# Patient Record
Sex: Female | Born: 1935 | Race: White | Hispanic: No | Marital: Single | Smoking: Former smoker
Health system: Southern US, Community
[De-identification: ages and names within clinical notes are randomized; demographics above are authoritative.]

## PROBLEM LIST (undated history)

## (undated) DIAGNOSIS — N390 Urinary tract infection, site not specified: Secondary | ICD-10-CM

## (undated) DIAGNOSIS — I10 Essential (primary) hypertension: Secondary | ICD-10-CM

## (undated) DIAGNOSIS — E785 Hyperlipidemia, unspecified: Secondary | ICD-10-CM

## (undated) DIAGNOSIS — I739 Peripheral vascular disease, unspecified: Secondary | ICD-10-CM

## (undated) DIAGNOSIS — D72820 Lymphocytosis (symptomatic): Secondary | ICD-10-CM

## (undated) DIAGNOSIS — E871 Hypo-osmolality and hyponatremia: Secondary | ICD-10-CM

## (undated) DIAGNOSIS — J449 Chronic obstructive pulmonary disease, unspecified: Secondary | ICD-10-CM

## (undated) HISTORY — DX: Hypo-osmolality and hyponatremia: E87.1

## (undated) HISTORY — PX: TONSILECTOMY, ADENOIDECTOMY, BILATERAL MYRINGOTOMY AND TUBES: SHX2538

## (undated) HISTORY — PX: ABDOMINAL HYSTERECTOMY: SHX81

## (undated) HISTORY — DX: Lymphocytosis (symptomatic): D72.820

## (undated) HISTORY — DX: Hyperlipidemia, unspecified: E78.5

## (undated) HISTORY — DX: Peripheral vascular disease, unspecified: I73.9

## (undated) HISTORY — DX: Chronic obstructive pulmonary disease, unspecified: J44.9

## (undated) HISTORY — DX: Urinary tract infection, site not specified: N39.0

## (undated) HISTORY — DX: Essential (primary) hypertension: I10

---

## 2004-10-24 ENCOUNTER — Ambulatory Visit (HOSPITAL_COMMUNITY): Admission: RE | Admit: 2004-10-24 | Discharge: 2004-10-24 | Payer: Self-pay | Admitting: Cardiology

## 2009-09-17 ENCOUNTER — Encounter (HOSPITAL_COMMUNITY): Admission: RE | Admit: 2009-09-17 | Discharge: 2009-10-15 | Payer: Self-pay | Admitting: Cardiology

## 2011-10-26 ENCOUNTER — Encounter: Payer: Self-pay | Admitting: Rehabilitative and Restorative Service Providers"

## 2011-11-07 ENCOUNTER — Encounter: Payer: Self-pay | Admitting: Physical Therapy

## 2011-11-20 ENCOUNTER — Other Ambulatory Visit: Payer: Self-pay | Admitting: Cardiology

## 2012-01-25 ENCOUNTER — Emergency Department: Payer: Self-pay | Admitting: Emergency Medicine

## 2012-01-25 LAB — COMPREHENSIVE METABOLIC PANEL
Albumin: 4.1 g/dL (ref 3.4–5.0)
Alkaline Phosphatase: 61 U/L (ref 50–136)
BUN: 11 mg/dL (ref 7–18)
Calcium, Total: 9.6 mg/dL (ref 8.5–10.1)
Co2: 31 mmol/L (ref 21–32)
Creatinine: 0.75 mg/dL (ref 0.60–1.30)
EGFR (African American): >60
EGFR (Non-African Amer.): >60
Glucose: 99 mg/dL (ref 65–99)
Osmolality: 251 (ref 275–301)
SGOT(AST): 24 U/L (ref 15–37)
Total Protein: 7.1 g/dL (ref 6.4–8.2)

## 2012-01-25 LAB — URINALYSIS, COMPLETE
Bilirubin,UR: NEGATIVE
Glucose,UR: NEGATIVE mg/dL (ref 0–75)
Ketone: NEGATIVE
Protein: NEGATIVE
Squamous Epithelial: 5
WBC UR: 10 /HPF (ref 0–5)

## 2012-01-25 LAB — CBC
MCHC: 33.3 g/dL (ref 32.0–36.0)
Platelet: 222 10*3/uL (ref 150–440)
RBC: 4.13 10*6/uL (ref 3.80–5.20)

## 2012-02-14 ENCOUNTER — Other Ambulatory Visit: Payer: Self-pay | Admitting: Cardiology

## 2012-05-08 ENCOUNTER — Telehealth: Payer: Self-pay | Admitting: Oncology

## 2012-05-08 NOTE — Telephone Encounter (Signed)
Referred by Dr. Elvis [facility]

## 2012-05-08 NOTE — Telephone Encounter (Signed)
S/w the pt's caregiver susan and she is aware of the new pt appt with dr ha on 05/09/2012

## 2012-05-09 ENCOUNTER — Other Ambulatory Visit (HOSPITAL_BASED_OUTPATIENT_CLINIC_OR_DEPARTMENT_OTHER): Payer: Medicare Other | Admitting: Lab

## 2012-05-09 ENCOUNTER — Encounter: Payer: Self-pay | Admitting: Oncology

## 2012-05-09 ENCOUNTER — Ambulatory Visit (HOSPITAL_BASED_OUTPATIENT_CLINIC_OR_DEPARTMENT_OTHER): Payer: Medicare Other | Admitting: Oncology

## 2012-05-09 ENCOUNTER — Ambulatory Visit: Payer: Medicare Other

## 2012-05-09 VITALS — BP 152/73 | HR 74 | Temp 97.0°F | Ht 63.0 in | Wt 129.9 lb

## 2012-05-09 DIAGNOSIS — D72820 Lymphocytosis (symptomatic): Secondary | ICD-10-CM

## 2012-05-09 LAB — CBC WITH DIFFERENTIAL/PLATELET
BASO%: 0.2 % (ref 0.0–2.0)
Basophils Absolute: 0 10*3/uL (ref 0.0–0.1)
EOS%: 3.3 % (ref 0.0–7.0)
HGB: 13.5 g/dL (ref 11.6–15.9)
MCH: 32.5 pg (ref 25.1–34.0)
MCHC: 34.1 g/dL (ref 31.5–36.0)
MCV: 95.3 fL (ref 79.5–101.0)
MONO%: 8.1 % (ref 0.0–14.0)
RDW: 14 % (ref 11.2–14.5)

## 2012-05-09 LAB — COMPREHENSIVE METABOLIC PANEL
Albumin: 3.8 g/dL (ref 3.5–5.2)
Alkaline Phosphatase: 76 U/L (ref 39–117)
BUN: 11 mg/dL (ref 6–23)
Calcium: 9.5 mg/dL (ref 8.4–10.5)
Chloride: 93 mEq/L — ABNORMAL LOW (ref 96–112)
Creatinine, Ser: 0.8 mg/dL (ref 0.50–1.10)
Glucose, Bld: 92 mg/dL (ref 70–99)
Potassium: 3.7 mEq/L (ref 3.5–5.3)

## 2012-05-09 LAB — MORPHOLOGY

## 2012-05-09 NOTE — Progress Notes (Signed)
[facility]  Telephone:([phone]) [phone] Fax:([phone]) [phone]     INITIAL HEMATOLOGY CONSULTATION    Referral MD:  Madeline Jimenez, M.D.  Reason for Referral: lymphocytosis.     HPI: [name] is a 77 year old woman with HTN, HLP, recurrent UTI for many years.  She has had decreased appetite the last few years with slowly decreasing weight.  She was noted to have marked hyponatremia.  Work up with Nephrology was negative including SPEP/UPEP and SIADH.  She was noted to have lymphocyte-predominant leukocytosis.  Therefore, Madeline Jimenez kindly referred her to the Cancer [facility] for evaluation.   Madeline Jimenez presented to the clinic today with her niece and grandniece.  She reported that she has been having progressive bilateral calf pain over the past few years.  Pain is crampy/burning; worsened with ambulation, no swelling or discoloration of the skin in her legs.  She just has no appetite.  She denied fever, night sweat, palpable nodes.  Patient denies fever, anorexia, fatigue, headache, visual changes, confusion, drenching night sweats, palpable lymph node swelling, mucositis, odynophagia, dysphagia, nausea vomiting, jaundice, chest pain, palpitation, shortness of breath, dyspnea on exertion, productive cough, gum bleeding, epistaxis, hematemesis, hemoptysis, abdominal pain, abdominal swelling, early satiety, melena, hematochezia, hematuria, skin rash, spontaneous bleeding, joint swelling,heat or cold intolerance, bowel bladder incontinence, back pain, focal motor weakness, paresthesia, depression, suicidal or homocidal ideation, feeling hopelessness.    Past Medical History  Diagnosis Date  . COPD (chronic obstructive pulmonary disease)   . HTN (hypertension)   . Hyperlipidemia   . Peripheral vascular disease   . Lymphocytosis   . Hyponatremia   . Recurrent UTI   :    Past Surgical History  Procedure Date  . Abdominal hysterectomy     for fibroid  .  Tonsilectomy, adenoidectomy, bilateral myringotomy and tubes   :   CURRENT MEDS: Current Outpatient Prescriptions  Medication Sig Dispense Refill  . amLODipine (NORVASC) 10 MG tablet Take 10 mg by mouth daily.      Marland Kitchen aspirin 81 MG tablet Take 81 mg by mouth daily.      . cephALEXin (KEFLEX) 500 MG capsule Take 500 mg by mouth 2 (two) times daily. X 10 days      . traMADol (ULTRAM) 50 MG tablet Take 50 mg by mouth daily.          No Known Allergies:  Family History  Problem Relation Age of Onset  . Hypertension Father   . Heart attack Father   . Heart attack Sister   :  History   Social History  . Marital Status: Single    Spouse Name: N/A    Number of Children: 1  . Years of Education: N/A   Occupational History  .      retired Advertising copywriter   Social History Main Topics  . Smoking status: Current Everyday Smoker -- 2.0 packs/day for 50 years  . Smokeless tobacco: Not on file  . Alcohol Use: No  . Drug Use: No  . Sexually Active:    Other Topics Concern  . Not on file   Social History Narrative  . No narrative on file  :  REVIEW OF SYSTEM:  The rest of the 14-point review of sytem was negative.   Exam: ECOG 1  General:  Thin-appearing woman, in no acute distress.  Eyes:  no scleral icterus.  ENT:  There were no oropharyngeal lesions.  Neck was without thyromegaly.  Lymphatics:  Negative cervical, supraclavicular or axillary adenopathy.  Respiratory: lungs were clear bilaterally without wheezing or crackles.  Cardiovascular:  Regular rate and rhythm, S1/S2, without murmur, rub or gallop.  There was no pedal edema.  GI:  abdomen was soft, flat, nontender, nondistended, without organomegaly.  Muscoloskeletal:  no spinal tenderness of palpation of vertebral spine.  Skin exam was without echymosis, petichae.  Neuro exam was nonfocal.  Patient was able to get on and off exam table without assistance.  Gait was normal.  Patient was alerted and oriented.  Attention was  good.   Language was appropriate.  Mood was normal without depression.  Speech was not pressured.  Thought content was not tangential.    LABS:  Lab Results  Component Value Date   WBC 8.3 05/09/2012   HGB 13.5 05/09/2012   HCT 39.5 05/09/2012   PLT 193 05/09/2012   GLUCOSE 92 05/09/2012   ALT 12 05/09/2012   AST 13 05/09/2012   NA 133* 05/09/2012   K 3.7 05/09/2012   CL 93* 05/09/2012   CREATININE 0.80 05/09/2012   BUN 11 05/09/2012   CO2 30 05/09/2012    Blood smear review:   I personally reviewed the patient's peripheral blood smear today.  There was isocytosis.  There was no peripheral blast.  There was no schistocytosis, spherocytosis, target cell, rouleaux formation, tear drop cell.  There was no giant platelets or platelet clumps.      ASSESSMENT AND PLAN:   1.  Recurrent UTI:  Her niece thinks that patient is not cleaning herself the correct way when she has a bowel movement.  However, if it is a recurrent problem, she may benefit from Urology referral.   2.  Poor PO intake:  No clear etiology.    3.  Smoking:  I strongly urged her to stop smoking given her PVD and family members with CAD.  I referred her to smoking cessation class.   4. Hyponatremia:  Ruled out for myeloma.  Most likely poor PO intake.  Her Na is improved today.   5.  Bilateral calf pain:  Most likely due to arterial insufficiency.  I strongly urged her to stop smoking before a referral to vascular surgery for evaluation for bypass.   6.  Lymphocytosis:  Most likely due to recurrent UTI.  Today, her WBC and lymphocytes were normal.  I have low clinical suspicion for lymphoproliferative diease due to lack of constitutional symptoms (except for anorexia which can be nonspecific and age related).  There is no indication for further work up.   Patient was discharged from clinic today.  Madeline Jimenez and relatives were relieved that her lymphocytosis has resolved and agreed with the plan for watchful observation.    RECOMMEND to PCP:   - CBC twice yearly to ensure no recurrent lymphocytosis.  - If recurrent and persistent lymphocytosis, patient can be referred back to [place] again.      Thank you for this referral.    The length of time of the face-to-face encounter was 30 minutes. More than 50% of time was spent counseling and coordination of care.

## 2012-05-10 ENCOUNTER — Telehealth: Payer: Self-pay | Admitting: *Deleted

## 2012-05-10 NOTE — Telephone Encounter (Signed)
Patient referred by Dr. Gaylyn Rong to smoking cessation classes. Madeline Jimenez Smoking Cessation class facilitator called patient 05/10/12 to let patient know about our smoking cessation program at the [facility]. Patient stated she is not interested at this time for has to much going on right now.

## 2012-08-07 ENCOUNTER — Other Ambulatory Visit: Payer: Self-pay | Admitting: Internal Medicine

## 2012-08-07 DIAGNOSIS — Z1231 Encounter for screening mammogram for malignant neoplasm of breast: Secondary | ICD-10-CM

## 2012-09-10 ENCOUNTER — Ambulatory Visit
Admission: RE | Admit: 2012-09-10 | Discharge: 2012-09-10 | Disposition: A | Discharge status: Home / Self Care | Payer: Medicare Other | Source: Ambulatory Visit | Attending: Internal Medicine | Admitting: Internal Medicine

## 2012-09-10 DIAGNOSIS — Z1231 Encounter for screening mammogram for malignant neoplasm of breast: Secondary | ICD-10-CM

## 2013-07-12 IMAGING — CR PELVIS - 1-2 VIEW
1 series · 2 of 2 positions shown · non-contrast
Comparison: none

REASON FOR EXAM: bilateral leg weakness/pain - falling
COMMENTS:

PROCEDURE:     DXR - DXR PELVIS AP ONLY  - January 25, 2012  [DATE]
RESULT:     There is no evidence of fracture, dislocation, or malalignment.

[Series 1: t pelvis ap · 0.14mm/px · 2 of 2 slices shown]
[im 1/2]
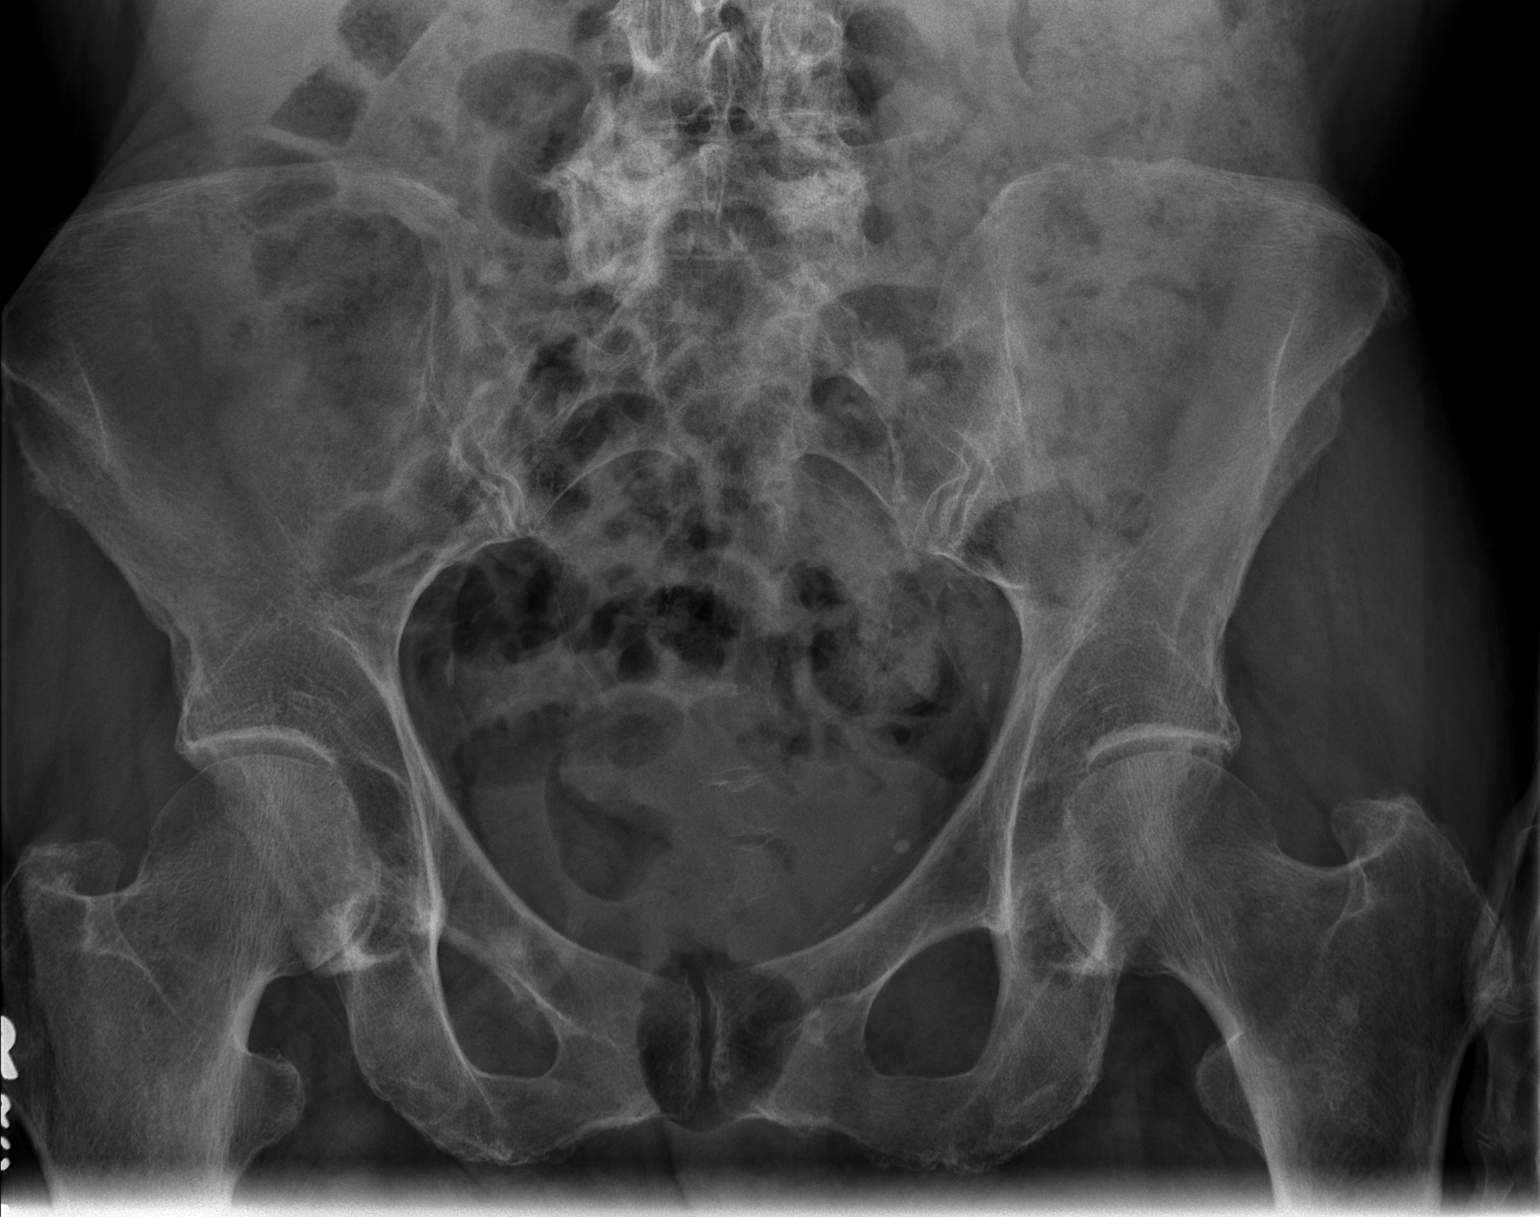
[im 2/2]
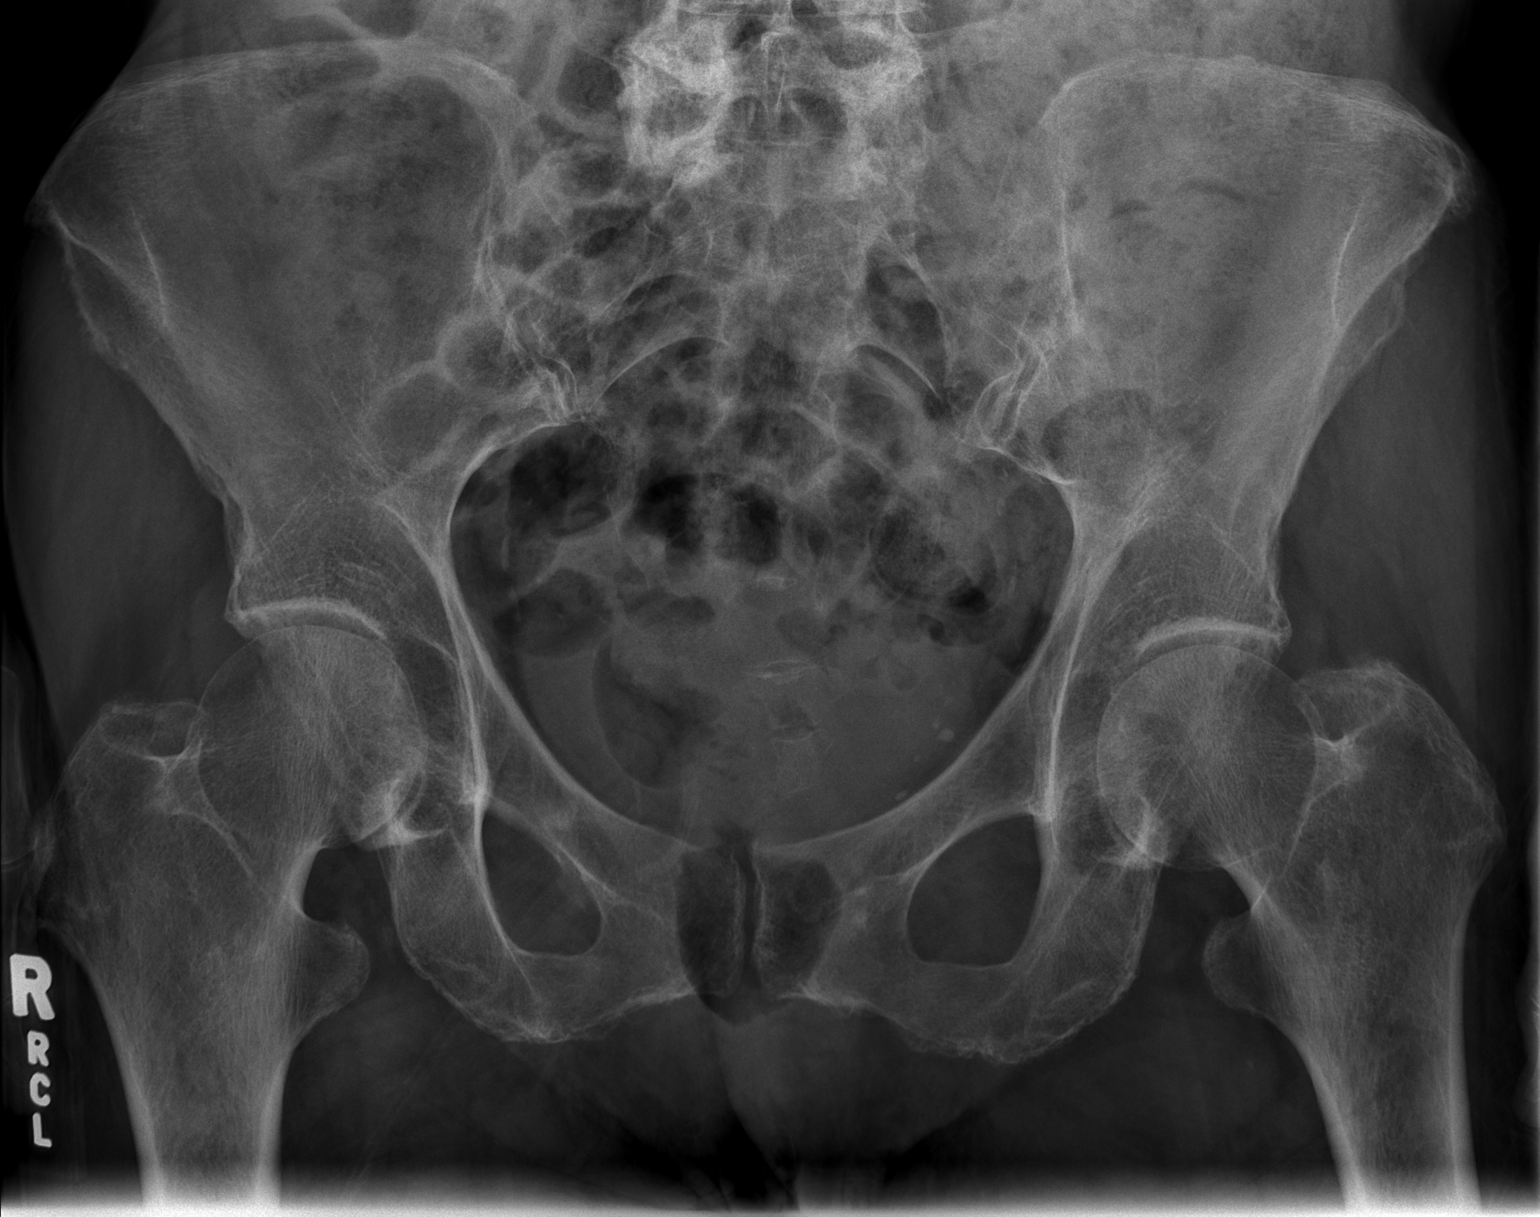

[2 of 2 positions shown; findings below may reference images not displayed]

IMPRESSION: 1. No evidence of acute abnormalities.
2. If there are persistent complaints of pain or persistent clinical
concern, a repeat evaluation in 7-10 days is recommended if clinically
warranted.

## 2014-05-12 ENCOUNTER — Other Ambulatory Visit (HOSPITAL_COMMUNITY): Payer: Self-pay | Admitting: Nurse Practitioner

## 2014-05-12 ENCOUNTER — Encounter: Payer: Self-pay | Admitting: Internal Medicine

## 2014-05-12 DIAGNOSIS — R52 Pain, unspecified: Secondary | ICD-10-CM

## 2014-05-14 ENCOUNTER — Emergency Department (HOSPITAL_COMMUNITY): Payer: Medicare Other

## 2014-05-14 ENCOUNTER — Observation Stay (HOSPITAL_COMMUNITY)
Admission: EM | Admit: 2014-05-14 | Discharge: 2014-05-15 | Disposition: A | Discharge status: Home / Self Care | Payer: Medicare Other | Attending: Internal Medicine | Admitting: Internal Medicine

## 2014-05-14 ENCOUNTER — Encounter (HOSPITAL_COMMUNITY): Payer: Self-pay | Admitting: Emergency Medicine

## 2014-05-14 DIAGNOSIS — I517 Cardiomegaly: Secondary | ICD-10-CM | POA: Diagnosis not present

## 2014-05-14 DIAGNOSIS — D72829 Elevated white blood cell count, unspecified: Secondary | ICD-10-CM | POA: Insufficient documentation

## 2014-05-14 DIAGNOSIS — X30XXXA Exposure to excessive natural heat, initial encounter: Secondary | ICD-10-CM | POA: Diagnosis not present

## 2014-05-14 DIAGNOSIS — Z7982 Long term (current) use of aspirin: Secondary | ICD-10-CM | POA: Diagnosis not present

## 2014-05-14 DIAGNOSIS — J449 Chronic obstructive pulmonary disease, unspecified: Secondary | ICD-10-CM

## 2014-05-14 DIAGNOSIS — I10 Essential (primary) hypertension: Secondary | ICD-10-CM

## 2014-05-14 DIAGNOSIS — B372 Candidiasis of skin and nail: Secondary | ICD-10-CM | POA: Diagnosis not present

## 2014-05-14 DIAGNOSIS — E785 Hyperlipidemia, unspecified: Secondary | ICD-10-CM | POA: Insufficient documentation

## 2014-05-14 DIAGNOSIS — T675XXA Heat exhaustion, unspecified, initial encounter: Secondary | ICD-10-CM | POA: Diagnosis present

## 2014-05-14 DIAGNOSIS — F0390 Unspecified dementia without behavioral disturbance: Secondary | ICD-10-CM | POA: Diagnosis not present

## 2014-05-14 DIAGNOSIS — F17200 Nicotine dependence, unspecified, uncomplicated: Secondary | ICD-10-CM | POA: Diagnosis not present

## 2014-05-14 DIAGNOSIS — E871 Hypo-osmolality and hyponatremia: Secondary | ICD-10-CM | POA: Diagnosis not present

## 2014-05-14 DIAGNOSIS — J9819 Other pulmonary collapse: Secondary | ICD-10-CM | POA: Insufficient documentation

## 2014-05-14 DIAGNOSIS — J4489 Other specified chronic obstructive pulmonary disease: Secondary | ICD-10-CM | POA: Insufficient documentation

## 2014-05-14 DIAGNOSIS — I739 Peripheral vascular disease, unspecified: Secondary | ICD-10-CM | POA: Insufficient documentation

## 2014-05-14 LAB — COMPREHENSIVE METABOLIC PANEL
ALT: 27 U/L (ref 0–35)
ANION GAP: 12 (ref 5–15)
AST: 50 U/L — ABNORMAL HIGH (ref 0–37)
Albumin: 3.9 g/dL (ref 3.5–5.2)
Alkaline Phosphatase: 78 U/L (ref 39–117)
BUN: 7 mg/dL (ref 6–23)
CALCIUM: 9.2 mg/dL (ref 8.4–10.5)
CO2: 28 meq/L (ref 19–32)
Chloride: 84 mEq/L — ABNORMAL LOW (ref 96–112)
Creatinine, Ser: 0.82 mg/dL (ref 0.50–1.10)
GFR, EST AFRICAN AMERICAN: 78 mL/min — AB (ref >90)
GFR, EST NON AFRICAN AMERICAN: 67 mL/min — AB (ref >90)
GLUCOSE: 93 mg/dL (ref 70–99)
Potassium: 4.2 mEq/L (ref 3.7–5.3)
Sodium: 124 mEq/L — ABNORMAL LOW (ref 137–147)
TOTAL PROTEIN: 6.4 g/dL (ref 6.0–8.3)
Total Bilirubin: 0.8 mg/dL (ref 0.3–1.2)

## 2014-05-14 LAB — CBC WITH DIFFERENTIAL/PLATELET
BASOS ABS: 0 10*3/uL (ref 0.0–0.1)
BASOS PCT: 0 % (ref 0–1)
EOS ABS: 0 10*3/uL (ref 0.0–0.7)
Eosinophils Relative: 0 % (ref 0–5)
HEMATOCRIT: 33.3 % — AB (ref 36.0–46.0)
HEMOGLOBIN: 11.4 g/dL — AB (ref 12.0–15.0)
LYMPHS ABS: 10.9 10*3/uL — AB (ref 0.7–4.0)
LYMPHS PCT: 54 % — AB (ref 12–46)
MCH: 31.5 pg (ref 26.0–34.0)
MCHC: 34.2 g/dL (ref 30.0–36.0)
MCV: 92 fL (ref 78.0–100.0)
MONO ABS: 0.6 10*3/uL (ref 0.1–1.0)
Monocytes Relative: 3 % (ref 3–12)
NEUTROS ABS: 8.6 10*3/uL — AB (ref 1.7–7.7)
Neutrophils Relative %: 43 % (ref 43–77)
Platelets: 207 10*3/uL (ref 150–400)
RBC: 3.62 MIL/uL — ABNORMAL LOW (ref 3.87–5.11)
RDW: 12.9 % (ref 11.5–15.5)
WBC: 20.1 10*3/uL — ABNORMAL HIGH (ref 4.0–10.5)

## 2014-05-14 LAB — URINALYSIS, ROUTINE W REFLEX MICROSCOPIC
BILIRUBIN URINE: NEGATIVE
Glucose, UA: NEGATIVE mg/dL
HGB URINE DIPSTICK: NEGATIVE
Ketones, ur: NEGATIVE mg/dL
Leukocytes, UA: NEGATIVE
NITRITE: NEGATIVE
PROTEIN: NEGATIVE mg/dL
SPECIFIC GRAVITY, URINE: 1.008 (ref 1.005–1.030)
UROBILINOGEN UA: 0.2 mg/dL (ref 0.0–1.0)
pH: 7 (ref 5.0–8.0)

## 2014-05-14 LAB — I-STAT CG4 LACTIC ACID, ED: LACTIC ACID, VENOUS: 0.85 mmol/L (ref 0.5–2.2)

## 2014-05-14 LAB — TSH: TSH: 1.08 u[IU]/mL (ref 0.350–4.500)

## 2014-05-14 LAB — CK: Total CK: 691 U/L — ABNORMAL HIGH (ref 7–177)

## 2014-05-14 MED ORDER — SODIUM CHLORIDE 0.9 % IV BOLUS (SEPSIS)
500.0000 mL | Freq: Once | INTRAVENOUS | Status: AC
  Administered 2014-05-14: 500 mL via INTRAVENOUS

## 2014-05-14 MED ORDER — TRIAMCINOLONE ACETONIDE 0.025 % EX CREA
TOPICAL_CREAM | Freq: Two times a day (BID) | CUTANEOUS | Status: DC
  Administered 2014-05-15: 08:00:00 via TOPICAL
  Filled 2014-05-14: qty 15

## 2014-05-14 MED ORDER — NYSTATIN 100000 UNIT/GM EX POWD
Freq: Two times a day (BID) | CUTANEOUS | Status: DC
  Administered 2014-05-15: 08:00:00 via TOPICAL
  Filled 2014-05-14: qty 15

## 2014-05-14 MED ORDER — LABETALOL HCL 200 MG PO TABS
200.0000 mg | ORAL_TABLET | Freq: Two times a day (BID) | ORAL | Status: DC
  Administered 2014-05-15: 200 mg via ORAL
  Filled 2014-05-14 (×3): qty 1

## 2014-05-14 MED ORDER — LABETALOL HCL 5 MG/ML IV SOLN: 5.0000 mg | Freq: Once | INTRAVENOUS | Status: DC

## 2014-05-14 MED ORDER — ENOXAPARIN SODIUM 40 MG/0.4ML ~~LOC~~ SOLN
40.0000 mg | SUBCUTANEOUS | Status: DC
  Administered 2014-05-15: 40 mg via SUBCUTANEOUS
  Filled 2014-05-14: qty 0.4

## 2014-05-14 MED ORDER — SODIUM CHLORIDE 0.9 % IV SOLN
INTRAVENOUS | Status: DC
  Administered 2014-05-14: via INTRAVENOUS

## 2014-05-14 MED ORDER — LABETALOL HCL 5 MG/ML IV SOLN
10.0000 mg | Freq: Once | INTRAVENOUS | Status: AC
  Administered 2014-05-14: 10 mg via INTRAVENOUS
  Filled 2014-05-14: qty 4

## 2014-05-14 MED ORDER — ALBUTEROL SULFATE (2.5 MG/3ML) 0.083% IN NEBU: 3.0000 mL | INHALATION_SOLUTION | Freq: Four times a day (QID) | RESPIRATORY_TRACT | Status: DC | PRN

## 2014-05-14 MED ORDER — ACETAMINOPHEN 650 MG RE SUPP: 650.0000 mg | Freq: Four times a day (QID) | RECTAL | Status: DC | PRN

## 2014-05-14 MED ORDER — SODIUM CHLORIDE 0.9 % IJ SOLN
3.0000 mL | Freq: Two times a day (BID) | INTRAMUSCULAR | Status: DC
  Administered 2014-05-15: 3 mL via INTRAVENOUS

## 2014-05-14 MED ORDER — ACETAMINOPHEN 325 MG PO TABS: 650.0000 mg | ORAL_TABLET | Freq: Four times a day (QID) | ORAL | Status: DC | PRN

## 2014-05-14 MED ORDER — ASPIRIN 81 MG PO CHEW
81.0000 mg | CHEWABLE_TABLET | Freq: Every day | ORAL | Status: DC
  Administered 2014-05-15: 81 mg via ORAL
  Filled 2014-05-14: qty 1

## 2014-05-14 MED ORDER — SODIUM CHLORIDE 0.9 % IV SOLN
Freq: Once | INTRAVENOUS | Status: AC
  Administered 2014-05-14: 18:00:00 via INTRAVENOUS

## 2014-05-14 MED ORDER — LABETALOL HCL 5 MG/ML IV SOLN: 10.0000 mg | Freq: Once | INTRAVENOUS | Status: DC

## 2014-05-14 MED ORDER — DONEPEZIL HCL 5 MG PO TABS
5.0000 mg | ORAL_TABLET | Freq: Every day | ORAL | Status: DC
  Administered 2014-05-15: 5 mg via ORAL
  Filled 2014-05-14: qty 1

## 2014-05-14 NOTE — H&P (Signed)
Date: 05/14/2014               Patient Name:  Madeline Jimenez MRN: [id number]  DOB: 03/05/1935 Age / Sex: 79 y.o., female   PCP: Suzan Garibaldi, FNP         Medical Service: Internal Medicine Teaching Service         Attending Physician: Dr. Madilyn Fireman, MD    First Contact: Dr. Arcelia Jew Pager: [phone]  Second Contact: Dr. Algis Liming Pager: [phone]       After Hours (After 5p/  First Contact Pager: [phone]  weekends / holidays): Second Contact Pager: [phone]   Chief Complaint: weakness  History of Present Illness: Madeline Jimenez is a 79 y.o. Female with PMHx of COPD, HTN, HLD, and PVD who presented to the ED with complaint of weakness. Pt states she was sitting outside today in the sun for one to two hours, then proceeded to try to stand up and couldn't get out of her chair. She states her legs were too weak. Her niece's husband saw her attempts and came outside to help her. At that point, the husband said the patient passed out, but the patient does not remember losing consciousness. No urinary or bowel incontinence. EMS was called and patient was brought to the ED. Upon arrival to the ED, pt was diaphoretic, "hot." Cold packs were placed in the patients axillas and groin. Pt states she hasn't drank many fluids today and that she hadn't taken her medications. She denied any headache, chest pain, shortness of breath, nausea, dizziness, change in vision, hearing or speech, or confusion. Pt states she feels much better now and denies any complaints.   Meds: No current facility-administered medications for this encounter.   Current Outpatient Prescriptions  Medication Sig Dispense Refill  . aspirin 81 MG tablet Take 81 mg by mouth daily.      Marland Kitchen donepezil (ARICEPT) 5 MG tablet Take 5 mg by mouth daily.      Marland Kitchen labetalol (NORMODYNE) 200 MG tablet Take 200 mg by mouth 2 (two) times daily.      Marland Kitchen losartan-hydrochlorothiazide (HYZAAR) 100-12.5 MG per tablet Take 1 tablet by mouth daily.      .  Methylfol-Methylcob-Acetylcyst (CEREFOLIN NAC PO) Take 1 tablet by mouth daily.      Marland Kitchen PROAIR HFA 108 (90 BASE) MCG/ACT inhaler Inhale 1 puff into the lungs every 6 (six) hours as needed.      . triamcinolone cream (KENALOG) 0.1 % Apply 1 application topically 2 (two) times daily.        Allergies: Allergies as of 05/14/2014  . (No Known Allergies)   Past Medical History  Diagnosis Date  . COPD (chronic obstructive pulmonary disease)   . HTN (hypertension)   . Hyperlipidemia   . Peripheral vascular disease   . Lymphocytosis   . Hyponatremia   . Recurrent UTI    Past Surgical History  Procedure Laterality Date  . Abdominal hysterectomy      for fibroid  . Tonsilectomy, adenoidectomy, bilateral myringotomy and tubes     Family History  Problem Relation Age of Onset  . Hypertension Father   . Heart attack Father   . Heart attack Sister    History   Social History  . Marital Status: Widowed    Spouse Name: N/A    Number of Children: 1  . Years of Education: N/A   Occupational History  .      retired Secretary/administrator   Social History  Main Topics  . Smoking status: Current Every Day Smoker -- 2.00 packs/day for 50 years  . Smokeless tobacco: Not on file  . Alcohol Use: No  . Drug Use: No  . Sexual Activity:    Other Topics Concern  . Not on file   Social History Narrative  . No narrative on file    Review of Systems: General: Admits to diaphoresis. Denies fever, chills, appetite change and fatigue.  Respiratory: Denies SOB, DOE, cough, chest tightness, and wheezing.   Cardiovascular: Denies chest pain and palpitations.  Gastrointestinal: Denies nausea, vomiting, abdominal pain, diarrhea, constipation, blood in stool and abdominal distention.  Genitourinary: Denies dysuria, urgency, frequency, hematuria, and flank pain. Endocrine: Denies hot or cold intolerance, polyuria, and polydipsia. Musculoskeletal: Denies myalgias, back pain, joint swelling, arthralgias and  gait problem.  Skin: Admits to rash on her buttock. Denies pallor, and wounds.  Neurological: Admits to weakness. Denies dizziness, seizures, syncope, numbness and headaches.  Psychiatric/Behavioral: Denies confusion, mood changes, nervousness, sleep disturbance and agitation.  Physical Exam: Filed Vitals:   05/14/14 2115 05/14/14 2159 05/14/14 2209 05/14/14 2225  BP: 184/71 190/74 193/149 177/96  Pulse: 64  65   Temp:      TempSrc:      Resp:   14   Height:      Weight:      SpO2: 98%  99%    General: Vital signs reviewed.  Patient is a well-developed and well-nourished, in no acute distress and cooperative with exam.  Head: Normocephalic and atraumatic. Eyes:  EOMI, conjunctivae normal, No scleral icterus.  Neck: Supple, trachea midline, normal ROM, No JVD, masses, thyromegaly, or carotid bruit present.  Cardiovascular: RRR, S1 normal, S2 normal, no murmurs, gallops, or rubs. Pulmonary/Chest: Clear to auscultation bilaterally, no wheezes, rales, or rhonchi. Abdominal: Soft, non-tender, non-distended, no suprapubic tenderness, BS +, no masses, organomegaly, or guarding present.  Musculoskeletal: No joint deformities, erythema, or stiffness, ROM full and nontender. Extremities: No lower extremity edema bilaterally,  pulses symmetric and intact bilaterally. No cyanosis or clubbing. Neurological: A&O x3, Strength is normal and symmetric bilaterally, 5/5 in all extremities, cranial nerve II-XII are grossly intact, no focal motor deficit, sensory intact to light touch bilaterally, EOMI.   Skin: Erythematous, moist skin around her sacral area and between gluteal folds. Punctate lesions with blood. Skin is mildly denuded.  Psychiatric: Normal mood and affect. speech and behavior is normal. Cognition and memory are normal.   Lab results: Basic Metabolic Panel:  Recent Labs  05/14/14 1628  NA 124*  K 4.2  CL 84*  CO2 28  GLUCOSE 93  BUN 7  CREATININE 0.82  CALCIUM 9.2   Liver  Function Tests:  Recent Labs  05/14/14 1628  AST 50*  ALT 27  ALKPHOS 78  BILITOT 0.8  PROT 6.4  ALBUMIN 3.9   CBC:  Recent Labs  05/14/14 1628  WBC 20.1*  NEUTROABS 8.6*  HGB 11.4*  HCT 33.3*  MCV 92.0  PLT 207     Recent Labs  05/14/14 1628  TSH 1.080   Urinalysis:  Recent Labs  05/14/14 1828  COLORURINE YELLOW  LABSPEC 1.008  PHURINE 7.0  GLUCOSEU NEGATIVE  HGBUR NEGATIVE  BILIRUBINUR NEGATIVE  KETONESUR NEGATIVE  PROTEINUR NEGATIVE  UROBILINOGEN 0.2  NITRITE NEGATIVE  LEUKOCYTESUR NEGATIVE   Imaging results:  Dg Chest 2 View  05/14/2014   CLINICAL DATA:  Altered mental status.  EXAM: CHEST  2 VIEW  COMPARISON:  None.  FINDINGS: Mediastinum  and hilar structures are normal. Cardiomegaly with normal pulmonary vascularity. Mild basilar atelectasis. Multiple left rib fractures are noted. Degenerative changes thoracic spine.  IMPRESSION: 1. Mild basilar atelectasis. 2. Cardiomegaly, normal pulmonary vascularity. 3. Old left rib fractures.   Electronically Signed   By: [place]   On: 05/14/2014 17:31   Ct Head Wo Contrast  05/14/2014   CLINICAL DATA:  Headache. Vertigo. High blood pressure. Hyperlipidemia.  EXAM: CT HEAD WITHOUT CONTRAST  TECHNIQUE: Contiguous axial images were obtained from the base of the skull through the vertex without intravenous contrast.  COMPARISON:  None.  FINDINGS: No intracranial hemorrhage.  Small vessel disease type changes.  Question posterior right frontal lobe infarct?  No intracranial mass lesion noted on this unenhanced exam.  No hydrocephalus.  Visualized mastoid air cells, middle ear cavities and visualized paranasal sinus are clear.  Mild exophthalmos.  Calcification internal carotid artery cavernous segment.  IMPRESSION: No intracranial hemorrhage.  Small vessel disease type changes.  Question posterior right frontal lobe infarct?   Electronically Signed   By: Chauncey Cruel M.D.   On: 05/14/2014 17:17    Assessment &  Plan by Problem:  Heat Exhaustion: Pt presents to ED after feeling weak and having a possible syncopal episode this afternoon after sitting in the sun for around 2 hours. She admits to poor hydration. Vital signs on admission : T 100.6, hypertensive at 170/64, HR 76, RR 18, pulse ox 97% on 2 L. Labs show leukocytosis of 20.1 and hyponatremia of 124. CK 691. Physical exam is unremarkable. No neurologic deficits, strength 5/5 in all extremities. No confusion. Weakness most like due to heat exhaustion and dehydration. Pt feels much better after 500 mL NS bolus in the ED. Symptoms are not likely due to CVA. Pt has no history of stroke or seizure. CT head without contrast shows no intracranial hemorrhage, shows small vessel disease type changes, and questionable posterior right frontal lobe infarct. This finding was discussed in person with radiology who thought it looked chronic in nature and noticed other white matter disease. Upon correlation with physical exam findings, CVA is unlikely. Symptoms are not likely due to infectious etiology. Pt has no complaints of cough, shortness of breath, fever, nausea, vomiting, diarrhea, abdominal or suprapubic pain. However, pt does have a leukocytosis of 20.1 and fever of 100.6 on admission. Lactic acid is normal at 0.85. CXR shows mild basilar atelectasis, cardiomegaly, normal pulmonary vascularity, and old left rib fractures. Urinalysis is negative for leukocytes, nitrites and ketones. -NS 125 mL/hr -Blood cultures x 2 -Urine culture -Bed rest -CBC/BMP in am -I/Os -Neuro checks -Orthostatic vital signs -Cardiac Diet  Lymphocyte-predominant Leukocytosis: Pt has a WBC of 20.1 on admission with predominant lymphocytes 54%. Pt has a history of lymphocyte predominant leukocytosis. She was previously seen by Dr. Lamonte Sakai in July 2013 who thought her leukocytosis was 2/2 recurrent UTIs. Dr. Lamonte Sakai stated if leukocytosis was to persist or progress, to consider referral back to  outpatient hematology/oncology. Pt originally febrile on admission, 100.6, now down at 98.7. No complaints of fever, denies dysuria or urinary frequency. Denies cough or shortness of breath. CXR and UA both normal (see above). No obvious signs of infection. Leukocytosis most likely due to heat exhaustion; however, if leukocytosis persists, consider further evaluation and/or outpatient heme/onc referral. -Blood cultures x 2 -Urine culture   Candidal Intertrigo: Pt complains of rash between gluteal folds and over sacrum that has persisted for several weeks. She uses triamcinolone cream at home. On  physical exam, patient has erythematous, moist skin around her sacral area and between gluteal folds. Punctate lesions with blood. Skin is mildly denuded. Most likely 2/2 fungal infection. No sign sacral ulcer. -Triamcinolone 0.025% cream BID -Nystatin topical powder BID -Consult wound care  Hyponatremia: Pt presents with Na of 124 on admission. History of hyponatremia, chronic in nature. Likely 2/2 hypovolemic hypotonic hyponatremia worsened in the setting of dehydration. Previous notes suggest, chronicity is due to poor po intake. Pt has been ruled out for multiple myeloma in the past. BUN 7, Cr 0.82, no lower extremity edema. Normal neurologic examination. -NS 125 mL/hr -Orthostatic vital signs -Hold HCTZ: thiazides can increase risk for neuro complications in hyponatremic patients  COPD: Pt has history of COPD. On nighttime oxygen via [place] at home. No complaints of shortness of breath, cough or DOE. CXR shows mild basilar atelectasis, cardiomegaly, normal pulmonary vascularity, and old left rib fractures. Pt uses proair at home. -Proventil albuterol inhaler 1 puff Q6H prn wheezing  -Oxygen via [place] prn   HTN: BP on admission 170/64. Pt has not taken her blood pressure medications today. Normally well controlled on BP meds at home. Given labetalol 10 mg IV once in the ED. No complaints of headache. On  labetalol, losartan-HCTZ at home -Labetalol 200 mg BID -Hold HCTZ in setting of hyponatremia  H/o Dementia: Niece states patient has been to her primary care physician/nurse practitioner several times in the last few weeks. They're concerned that her "memory is getting difficult". AAOx3. No confusion. Pt on donepezil 5 mg daily at home. -Continue donepezil 5 mg daily  DVT/PE ppx: Lovenox 40 mg SQ daily  Dispo: Disposition is deferred at this time, awaiting improvement of current medical problems. Anticipated discharge in approximately 1-2 day(s).   The patient does have a current PCP Suzan Garibaldi, [place]) and does not need an [facility] hospital follow-up appointment after discharge.  The patient does not have transportation limitations that hinder transportation to clinic appointments.  Signed: Osa Craver, DO PGY-1 Internal Medicine Resident Pager # [phone] 05/14/2014 10:59 PM

## 2014-05-14 NOTE — ED Notes (Signed)
Patient lab results reported to Dr.James.

## 2014-05-14 NOTE — ED Notes (Signed)
Pt unable to void on bedpan

## 2014-05-14 NOTE — ED Provider Notes (Signed)
CSN: [id number]     Arrival date & time 05/14/14  1511 History   First MD Initiated Contact with Patient 05/14/14 1547     Chief Complaint  Patient presents with  . Heat Exposure  . Altered Mental Status      HPI  Patient presents via EMS. She was sitting in a chair outside of her home on a patio,  for over an hour. Daughter states that she left to go to the store. Her mother wassitting in a chair on the back. In the sun, not in the shade. She was gone a little over an hour. She pulled into the  Driveway and saw her husband was "waving  wildly". He states that he saw his mother-in-law trying to stand and walk and she seemed wobbly and weak. He helped her to lay  down onto the deck. She did not fall. She did not injure her head. She was not unconscious.  Daughter states she has been to her primary care physician/nurse practitioner several times in the last few weeks. They're concerned that her "memory is getting difficult". Her vision has changed and scheduled for an eye exam. She noticed some bleeding from an external hemorrhoid. No frank BRBPR or melena.  Past Medical History  Diagnosis Date  . COPD (chronic obstructive pulmonary disease)   . HTN (hypertension)   . Hyperlipidemia   . Peripheral vascular disease   . Lymphocytosis   . Hyponatremia   . Recurrent UTI    Past Surgical History  Procedure Laterality Date  . Abdominal hysterectomy      for fibroid  . Tonsilectomy, adenoidectomy, bilateral myringotomy and tubes     Family History  Problem Relation Age of Onset  . Hypertension Father   . Heart attack Father   . Heart attack Sister    History  Substance Use Topics  . Smoking status: Current Every Day Smoker -- 2.00 packs/day for 50 years  . Smokeless tobacco: Not on file  . Alcohol Use: No   OB History   Grav Para Term Preterm Abortions TAB SAB Ect Mult Living                 Review of Systems  Constitutional: Positive for diaphoresis and fatigue. Negative for  fever, chills and appetite change.  HENT: Negative for mouth sores, sore throat and trouble swallowing.   Eyes: Negative for visual disturbance.  Respiratory: Negative for cough, chest tightness, shortness of breath and wheezing.   Cardiovascular: Negative for chest pain.  Gastrointestinal: Negative for nausea, vomiting, abdominal pain, diarrhea and abdominal distention.  Endocrine: Negative for polydipsia, polyphagia and polyuria.  Genitourinary: Negative for dysuria, frequency and hematuria.  Musculoskeletal: Negative for gait problem.  Skin: Negative for color change, pallor and rash.  Neurological: Positive for weakness and light-headedness. Negative for dizziness, syncope and headaches.  Hematological: Does not bruise/bleed easily.  Psychiatric/Behavioral: Negative for behavioral problems and confusion.      Allergies  Review of patient's allergies indicates no known allergies.  Home Medications   Prior to Admission medications   Medication Sig Start Date End Date Taking? Authorizing Provider  aspirin 81 MG tablet Take 81 mg by mouth daily.   Yes Historical Provider, MD  donepezil (ARICEPT) 5 MG tablet Take 5 mg by mouth daily. 05/06/14  Yes Historical Provider, MD  labetalol (NORMODYNE) 200 MG tablet Take 200 mg by mouth 2 (two) times daily. 04/29/14  Yes Historical Provider, MD  losartan-hydrochlorothiazide (HYZAAR) 100-12.5 MG per tablet  Take 1 tablet by mouth daily. 04/22/14  Yes Historical Provider, MD  Methylfol-Methylcob-Acetylcyst (CEREFOLIN NAC PO) Take 1 tablet by mouth daily.   Yes Historical Provider, MD  PROAIR HFA 108 (90 BASE) MCG/ACT inhaler Inhale 1 puff into the lungs every 6 (six) hours as needed. 04/29/14  Yes Historical Provider, MD  triamcinolone cream (KENALOG) 0.1 % Apply 1 application topically 2 (two) times daily. 05/13/14  Yes Historical Provider, MD   BP 179/55  Pulse 65  Temp(Src) 97.9 F (36.6 C) (Oral)  Resp 16  Ht 5' 4.5" (1.638 m)  Wt 157 lb 10.1  oz (71.5 kg)  BMI 26.65 kg/m2  SpO2 100% Physical Exam  Constitutional: She is oriented to person, place, and time. She appears well-developed and well-nourished. No distress.  Heart appearing. Alert. Oriented.  HENT:  Head: Normocephalic.  Very hard of hearing  Eyes: Conjunctivae are normal. Pupils are equal, round, and reactive to light. No scleral icterus.  Neck: Normal range of motion. Neck supple. No thyromegaly present.  Cardiovascular: Normal rate and regular rhythm.  Exam reveals no gallop and no friction rub.   No murmur heard. Pulmonary/Chest: Effort normal and breath sounds normal. No respiratory distress. She has no wheezes. She has no rales.    Abdominal: Soft. Bowel sounds are normal. She exhibits no distension. There is no tenderness. There is no rebound.  Musculoskeletal: Normal range of motion.  Neurological: She is alert and oriented to person, place, and time.  Cranial nerves intact and symmetric. Mental status: Follows requests and commands. Communicative and interactive. No asymmetry to her peripheral strength and sensation.  Skin: Skin is warm and dry. No rash noted.     Psychiatric: She has a normal mood and affect. Her behavior is normal.    ED Course  Procedures (including critical care time) Labs Review Labs Reviewed  CBC WITH DIFFERENTIAL - Abnormal; Notable for the following:    WBC 20.1 (*)    RBC 3.62 (*)    Hemoglobin 11.4 (*)    HCT 33.3 (*)    Lymphocytes Relative 54 (*)    Neutro Abs 8.6 (*)    Lymphs Abs 10.9 (*)    All other components within normal limits  COMPREHENSIVE METABOLIC PANEL - Abnormal; Notable for the following:    Sodium 124 (*)    Chloride 84 (*)    AST 50 (*)    GFR calc non Af Amer 67 (*)    GFR calc Af Amer 78 (*)    All other components within normal limits  CK - Abnormal; Notable for the following:    Total CK 691 (*)    All other components within normal limits  URINE CULTURE  CULTURE, BLOOD (ROUTINE X 2)    CULTURE, BLOOD (ROUTINE X 2)  URINALYSIS, ROUTINE W REFLEX MICROSCOPIC  TSH  MAGNESIUM  COMPREHENSIVE METABOLIC PANEL  CBC WITH DIFFERENTIAL  CK  I-STAT CG4 LACTIC ACID, ED    Imaging Review Dg Chest 2 View  05/14/2014   CLINICAL DATA:  Altered mental status.  EXAM: CHEST  2 VIEW  COMPARISON:  None.  FINDINGS: Mediastinum and hilar structures are normal. Cardiomegaly with normal pulmonary vascularity. Mild basilar atelectasis. Multiple left rib fractures are noted. Degenerative changes thoracic spine.  IMPRESSION: 1. Mild basilar atelectasis. 2. Cardiomegaly, normal pulmonary vascularity. 3. Old left rib fractures.   Electronically Signed   By: Maisie Fus  Register   On: 05/14/2014 17:31   Ct Head Wo Contrast  05/14/2014  CLINICAL DATA:  Headache. Vertigo. High blood pressure. Hyperlipidemia.  EXAM: CT HEAD WITHOUT CONTRAST  TECHNIQUE: Contiguous axial images were obtained from the base of the skull through the vertex without intravenous contrast.  COMPARISON:  None.  FINDINGS: No intracranial hemorrhage.  Small vessel disease type changes.  Question posterior right frontal lobe infarct?  No intracranial mass lesion noted on this unenhanced exam.  No hydrocephalus.  Visualized mastoid air cells, middle ear cavities and visualized paranasal sinus are clear.  Mild exophthalmos.  Calcification internal carotid artery cavernous segment.  IMPRESSION: No intracranial hemorrhage.  Small vessel disease type changes.  Question posterior right frontal lobe infarct?   Electronically Signed   By: Bridgett Larsson M.D.   On: 05/14/2014 17:17     EKG Interpretation None      MDM   Final diagnoses:  Hyponatremia    Pt with Ct suggestive of CVA.  No current neuro findings.  Slight hyponatremia.  Will admit for hydration, sodium correction, MRI.    Rolland Porter, MD 05/15/14 [phone]

## 2014-05-14 NOTE — ED Notes (Signed)
Pt placed on bedpan

## 2014-05-14 NOTE — ED Notes (Signed)
Pt transported to CT ?

## 2014-05-14 NOTE — ED Notes (Signed)
Pt back from CT. Upon arrival to room, pt notified RN of voiding on bed.  Linens changed.  EDP notified of incontinence.  Second liter of IVF started.  Will attempt in and out within 30 minutes.  EDP made aware.

## 2014-05-14 NOTE — ED Notes (Signed)
Per EMS: pt found outside at home.  Questionable fall.  Pt sts she did not fall.  Sts she was outside sitting in a chair and attempted to get up but could not get up. Pt sts she sat outside for about half an hour.  Pt sts she was diaphoretic and "hot".  Pt warm to touch.  Rectal temp 100.6 on arrival. Pt with cold pack in place to arms and crotch.  Sts she feels a little cooler.  Pt given 500 cc bolus of NS.  Pt CAO x 4 on arrival.

## 2014-05-15 ENCOUNTER — Ambulatory Visit (HOSPITAL_COMMUNITY): Payer: Medicare Other

## 2014-05-15 DIAGNOSIS — B372 Candidiasis of skin and nail: Secondary | ICD-10-CM | POA: Diagnosis not present

## 2014-05-15 DIAGNOSIS — E871 Hypo-osmolality and hyponatremia: Secondary | ICD-10-CM | POA: Diagnosis not present

## 2014-05-15 DIAGNOSIS — T675XXA Heat exhaustion, unspecified, initial encounter: Secondary | ICD-10-CM | POA: Diagnosis not present

## 2014-05-15 DIAGNOSIS — D72829 Elevated white blood cell count, unspecified: Secondary | ICD-10-CM | POA: Diagnosis not present

## 2014-05-15 LAB — COMPREHENSIVE METABOLIC PANEL
ALK PHOS: 74 U/L (ref 39–117)
ALT: 22 U/L (ref 0–35)
AST: 37 U/L (ref 0–37)
Albumin: 3.5 g/dL (ref 3.5–5.2)
Anion gap: 12 (ref 5–15)
BUN: 7 mg/dL (ref 6–23)
CO2: 27 meq/L (ref 19–32)
Calcium: 8.8 mg/dL (ref 8.4–10.5)
Chloride: 92 mEq/L — ABNORMAL LOW (ref 96–112)
Creatinine, Ser: 0.77 mg/dL (ref 0.50–1.10)
GFR calc Af Amer: >90 mL/min (ref >90)
GFR, EST NON AFRICAN AMERICAN: 79 mL/min — AB (ref >90)
Glucose, Bld: 97 mg/dL (ref 70–99)
POTASSIUM: 4.2 meq/L (ref 3.7–5.3)
SODIUM: 131 meq/L — AB (ref 137–147)
Total Bilirubin: 0.7 mg/dL (ref 0.3–1.2)
Total Protein: 5.8 g/dL — ABNORMAL LOW (ref 6.0–8.3)

## 2014-05-15 LAB — CBC WITH DIFFERENTIAL/PLATELET
BASOS PCT: 0 % (ref 0–1)
Basophils Absolute: 0 10*3/uL (ref 0.0–0.1)
EOS ABS: 0.2 10*3/uL (ref 0.0–0.7)
Eosinophils Relative: 1 % (ref 0–5)
HCT: 31.7 % — ABNORMAL LOW (ref 36.0–46.0)
HEMOGLOBIN: 10.7 g/dL — AB (ref 12.0–15.0)
Lymphocytes Relative: 64 % — ABNORMAL HIGH (ref 12–46)
Lymphs Abs: 10.8 10*3/uL — ABNORMAL HIGH (ref 0.7–4.0)
MCH: 31.3 pg (ref 26.0–34.0)
MCHC: 33.8 g/dL (ref 30.0–36.0)
MCV: 92.7 fL (ref 78.0–100.0)
MONO ABS: 0.7 10*3/uL (ref 0.1–1.0)
Monocytes Relative: 4 % (ref 3–12)
NEUTROS PCT: 31 % — AB (ref 43–77)
Neutro Abs: 5.2 10*3/uL (ref 1.7–7.7)
PLATELETS: 203 10*3/uL (ref 150–400)
RBC: 3.42 MIL/uL — ABNORMAL LOW (ref 3.87–5.11)
RDW: 13.3 % (ref 11.5–15.5)
WBC: 16.9 10*3/uL — ABNORMAL HIGH (ref 4.0–10.5)

## 2014-05-15 LAB — MAGNESIUM: Magnesium: 1.6 mg/dL (ref 1.5–2.5)

## 2014-05-15 LAB — CK: CK TOTAL: 441 U/L — AB (ref 7–177)

## 2014-05-15 LAB — TROPONIN I: Troponin I: <0.3 ng/mL (ref <0.30)

## 2014-05-15 LAB — PATHOLOGIST SMEAR REVIEW

## 2014-05-15 MED ORDER — LOSARTAN POTASSIUM 100 MG PO TABS: 100.0000 mg | ORAL_TABLET | Freq: Every day | ORAL | Status: DC

## 2014-05-15 MED ORDER — AMLODIPINE BESYLATE 2.5 MG PO TABS
2.5000 mg | ORAL_TABLET | Freq: Every day | ORAL | Status: DC
  Filled 2014-05-15: qty 1

## 2014-05-15 MED ORDER — HYDRALAZINE HCL 20 MG/ML IJ SOLN
5.0000 mg | Freq: Once | INTRAMUSCULAR | Status: AC
  Administered 2014-05-15: 5 mg via INTRAVENOUS
  Filled 2014-05-15: qty 1

## 2014-05-15 MED ORDER — AMLODIPINE BESYLATE 5 MG PO TABS
5.0000 mg | ORAL_TABLET | Freq: Every day | ORAL | Status: DC
  Filled 2014-05-15 (×2): qty 1

## 2014-05-15 MED ORDER — AMLODIPINE BESYLATE 2.5 MG PO TABS: 2.5000 mg | ORAL_TABLET | Freq: Every day | ORAL | Status: DC

## 2014-05-15 MED ORDER — LOSARTAN POTASSIUM 50 MG PO TABS
100.0000 mg | ORAL_TABLET | Freq: Every day | ORAL | Status: DC
  Administered 2014-05-15: 100 mg via ORAL
  Filled 2014-05-15: qty 2

## 2014-05-15 NOTE — Evaluation (Signed)
Physical Therapy Evaluation Patient Details Name: Madeline Jimenez MRN: [id number] DOB: April 30, 1935 Today's Date: 05/15/2014   History of Present Illness  Madeline Jimenez is a 79 y.o. Female with PMHx of COPD, HTN, HLD, and PVD who presented to the ED with complaint of weakness. Pt states she was sitting outside 7/30 in the sun for one to two hours, then proceeded to try to stand up and couldn't get out of her chair. She states her legs were too weak. Her niece's husband saw her attempts and came outside to help her. At that point, the husband said the patient passed out. Pt with heat exhaustion, hyponatremia, leukocytosis, and a sacral rash.   Clinical Impression  Pt pleasant but confused and demonstrates poor balance with need for assist with transfers and gait secondary to posterior LOB. Pt will benefit from acute therapy to maximize mobility, gait and function to decrease burden of care along with HHPT and supervision for mobility at home. Pt states 24hr assist is available between niece, her spouse and son who all live with pt. Recommend daily mobility acutely.     Follow Up Recommendations Home health PT    Equipment Recommendations  Rolling walker with 5" wheels    Recommendations for Other Services OT consult     Precautions / Restrictions Precautions Precautions: Fall      Mobility  Bed Mobility Overal bed mobility: Needs Assistance Bed Mobility: Supine to Sit     Supine to sit: Min guard     General bed mobility comments: cues for sequence as pt reaching out for assist with guarding for balance with initial sitting  Transfers Overall transfer level: Needs assistance   Transfers: Sit to/from Stand Sit to Stand: Min assist         General transfer comment: cues for hand placement and sequence with transfers from bed, BSC and chair  Ambulation/Gait Ambulation/Gait assistance: Min assist Ambulation Distance (Feet): 160 Feet Assistive device: Rolling walker (2  wheeled);None Gait Pattern/deviations: Step-through pattern;Decreased stride length;Trunk flexed   Gait velocity interpretation: Below normal speed for age/gender General Gait Details: without RW pt with posterior lean with min assist to walk 15', with RW improved balance and stability with minguard assist for stability and cues for posture and position in RW  Stairs            Wheelchair Mobility    Modified Rankin (Stroke Patients Only)       Balance Overall balance assessment: Needs assistance   Sitting balance-Leahy Scale: Fair       Standing balance-Leahy Scale: Poor                               Pertinent Vitals/Pain No pain sats 98% on RA HR 69    Home Living Family/patient expects to be discharged to:: Private residence Living Arrangements: Other relatives Available Help at Discharge: Family;Available 24 hours/day Type of Home: House Home Access: Stairs to enter Entrance Stairs-Rails: None Entrance Stairs-Number of Steps: 2 Home Layout: One level Home Equipment: None      Prior Function Level of Independence: Independent         Comments: pt reports independent for all activities but no family present to confirm     Hand Dominance        Extremity/Trunk Assessment   Upper Extremity Assessment: Generalized weakness           Lower Extremity Assessment: Generalized weakness  Cervical / Trunk Assessment: Normal  Communication   Communication: HOH (bilateral hearing aids)  Cognition Arousal/Alertness: Awake/alert Behavior During Therapy: WFL for tasks assessed/performed Overall Cognitive Status: Impaired/Different from baseline Area of Impairment: Safety/judgement;Memory;Problem solving     Memory: Decreased short-term memory   Safety/Judgement: Decreased awareness of deficits;Decreased awareness of safety   Problem Solving: Slow processing;Difficulty sequencing      General Comments General comments (skin  integrity, edema, etc.): pt reports only one fall in the last year    Exercises        Assessment/Plan    PT Assessment Patient needs continued PT services  PT Diagnosis Difficulty walking;Altered mental status;Generalized weakness   PT Problem List Decreased strength;Decreased activity tolerance;Decreased mobility;Decreased safety awareness;Decreased balance;Decreased knowledge of use of DME  PT Treatment Interventions DME instruction;Functional mobility training;Gait training;Therapeutic activities;Therapeutic exercise;Stair training;Patient/family education;Balance training   PT Goals (Current goals can be found in the Care Plan section) Acute Rehab PT Goals Patient Stated Goal: return home  PT Goal Formulation: With patient Time For Goal Achievement: 05/29/14 Potential to Achieve Goals: Fair    Frequency Min 3X/week   Barriers to discharge        Co-evaluation               End of Session   Activity Tolerance: Patient tolerated treatment well Patient left: in chair;with chair alarm set;with call bell/phone within reach;with nursing/sitter in room Nurse Communication: Mobility status;Precautions         Time: [id number] PT Time Calculation (min): 29 min   Charges:   PT Evaluation $Initial PT Evaluation Tier I: 1 Procedure PT Treatments $Gait Training: 8-22 mins $Therapeutic Activity: 8-22 mins   PT G Codes:          Delorse Lekabor, Dylann Gallier Beth 05/15/2014, 2:12 PM Delaney MeigsMaija Tabor Patric Vanpelt, PT [phone]

## 2014-05-15 NOTE — H&P (Signed)
INTERNAL MEDICINE TEACHING ATTENDING NOTE  Day 1 of stay  Patient name: Madeline Jimenez  MRN: [id number] Date of birth: 02-11-1935   Key clinical points and exam                                                           79 y.o.female with hypertension on hctz admitted with hyponatremia and syncope secondary to heat illness. She has been resuscitated with saline this admission. I met with her this morning and she appeared to be clinically improved. She did not complain of any dizziness, nausea or vomiting. She is incontinent at baseline and denies any urinary compaints. She had no new complaints.   Her vitals have been stable (BP - 149-193/121-55). Her exam - she was alert and oriented, she is hard of hearing. Her cardiac exam reveals systolic murmur, regular rate and rhythm. Her lungs are clear to auscultation. Her abdomen is benign. She has no gross neurological deficits.   I have reviewed the chart, lab results, EKG, imaging and relevant notes of this patient.   Assessment and Plan                                                                       In addition to the work up by Dr Marvel Plan, I would also add one set of troponin and a follow up EKG to rule out any acute MI. If that is negative, I think we can safely discharge her. She has stable vitals (hypertensive), her CK is trending down, and sodium has trended up to 131. WBC is trending down as well. I will restart her on cozar given her severe hypertension, and hold the HCTZ on discharge, however I would schedule an early appointment (preferrably Monday) with her PCP to review electrolytes (possibility of K trending up on cozar) and hypertension. Instead of HCTZ which has a higher propensity of causing hypoglycemia, another antihypertensive might be better suited for her.      I have reviewed the admission note and discussed the management with Drs Yulee Bing and [place]. I agree with the assessment and plan and  documentation. Please see my progress note from today for details.   [place], [place] 05/15/2014, 10:42 AM.

## 2014-05-15 NOTE — Progress Notes (Signed)
Discharge instructions given. Pt and family member verbalized understanding. All questions were answered.

## 2014-05-15 NOTE — Consult Note (Addendum)
WOC wound consult note Reason for Consult: Consult requested for buttocks.  Pt states she sat outside in the heat for several hours yesterday.  She admits she was wearing a Depends adult containment garment for urinary incontinence.   Wound type: Patchy areas of red macular-papular skin rash and partial thickness skin loss; appearance consistent with moisture associated skin damage and candidiasis. Affected area is across bilat buttocks, and also some red rash is located down middle spine and across bilat shoulders.  There are NO pressure ulcers. Areas of partial thickness skin loss are moist with small amt yellow drainage and no odor. Dressing procedure/placement/frequency: Triamcinolone cream has been ordered and also Nystatin cream.  Agree with present plan of care which has been ordered.  Discussed topical treatment with patient. She is currently incontinent but skin left open to air and quilted pad underneath buttocks to optimize airflow to affected areas. Discussed with patient to avoid use of containment garments for prolonged periods of time in the heat when at home if possible.   Please re-consult if further assistance is needed.  Thank-you,  Cammie Mcgeeawn Chaley Castellanos MSN, RN, CWOCN, [place]-AP, CNS [phone]

## 2014-05-15 NOTE — Consult Note (Signed)
Spoke with bedside nurse, WOC evaluated patient just 2 hours ago for same reason, no other changes. Will discontinue the order. Mehran Guderian [place] RN,CWOCN [phone]

## 2014-05-15 NOTE — Care Management Note (Signed)
    Page 1 of 2   05/15/2014     4:15:49 PM CARE MANAGEMENT NOTE 05/15/2014  Patient:  Madeline Jimenez,Madeline Jimenez   Account Number:  [id number]  Date Initiated:  05/15/2014  Documentation initiated by:  Mersadez Linden  Subjective/Objective Assessment:   Pt adm on 05/14/14 with hyponatremia, hyperthermia.  PTA, pt lives at home with family.     Action/Plan:   HHPT, RW recommended for home, and pt/fam agreeable with this.  [facility]  [facility] date.   Anticipated DC Date:  05/15/2014   Anticipated DC Plan:  HOME W HOME HEALTH SERVICES      DC Planning Services  CM consult      [facility]   Choice offered to / List presented to:  C-4 Adult Children   DME arranged  [facility]      DME agency  [facility]     HH arranged  HH[facility]2 PT      [facility]   Status of service:  Completed, signed off Medicare Important Message given?   (If response is "NO", the following Medicare IM given date fields will be blank) Date Medicare IM given:   Medicare IM given by:   Date Additional Medicare IM given:   Additional Medicare IM given by:    Discharge Disposition:  HOME W HOME HEALTH SERVICES  Per UR Regulation:  Reviewed for med. necessity/level of care/duration of stay  If discussed at Long Length of Stay Meetings, dates discussed:    Comments:  [phone] Orvil FeilSusan Reberg, niece

## 2014-05-15 NOTE — Progress Notes (Signed)
Subjective: Patient is feeling much better today. She denies dizziness, CP, palpitations, SOB, muscle cramps, abdominal pain, N/V. After receiving IVF patient's labs have improved with CK 691--> 441 and Na 124--> 131.   Objective: Vital signs in last 24 hours: Filed Vitals:   05/14/14 2225 05/14/14 2329 05/15/14 0309 05/15/14 0456  BP: 177/96 179/55 186/61 187/73  Pulse:  65 64 76  Temp:  97.9 F (36.6 C)  97.9 F (36.6 C)  TempSrc:  Oral  Oral  Resp:  16  18  Height:  5' 4.5" (1.638 m)    Weight:  157 lb 10.1 oz (71.5 kg)    SpO2:  100%  100%   Weight change:   Intake/Output Summary (Last 24 hours) at 05/15/14 1110 Last data filed at 05/15/14 1040  Gross per 24 hour  Intake    240 ml  Output    400 ml  Net   -160 ml   Physical Exam General: laying in bed, cooperative, NAD HEENT: [place]/AT, EOMI, PERRL, mucus membranes moist  Neck: supple, no JVD CV: RRR, normal S1/S2, no m/g/r Pulm: CTA bilaterally, breaths non-labored Abd: BS+, soft, non-distended, non-tender Ext: warm, no edema, moves all Neuro: alert and oriented x 3, strength 5/5 in upper and lower extremities, CNs II-XII intact Skin: erythematous, moist skin in sacral area  Lab Results: Basic Metabolic Panel:  Recent Labs Lab 05/14/14 1628 05/15/14 0620  NA 124* 131*  K 4.2 4.2  CL 84* 92*  CO2 28 27  GLUCOSE 93 97  BUN 7 7  CREATININE 0.82 0.77  CALCIUM 9.2 8.8  MG 1.6  --    Liver Function Tests:  Recent Labs Lab 05/14/14 1628 05/15/14 0620  AST 50* 37  ALT 27 22  ALKPHOS 78 74  BILITOT 0.8 0.7  PROT 6.4 5.8*  ALBUMIN 3.9 3.5   No results found for this basename: LIPASE, AMYLASE,  in the last 168 hours No results found for this basename: AMMONIA,  in the last 168 hours CBC:  Recent Labs Lab 05/14/14 1628 05/15/14 0620  WBC 20.1* 16.9*  NEUTROABS 8.6* 5.2  HGB 11.4* 10.7*  HCT 33.3* 31.7*  MCV 92.0 92.7  PLT 207 203   Cardiac Enzymes:  Recent Labs Lab 05/14/14 1628  05/15/14 0620  CKTOTAL 691* 441*   BNP: No results found for this basename: PROBNP,  in the last 168 hours D-Dimer: No results found for this basename: DDIMER,  in the last 168 hours CBG: No results found for this basename: GLUCAP,  in the last 168 hours Hemoglobin A1C: No results found for this basename: HGBA1C,  in the last 168 hours Fasting Lipid Panel: No results found for this basename: CHOL, HDL, LDLCALC, TRIG, CHOLHDL, LDLDIRECT,  in the last 168 hours Thyroid Function Tests:  Recent Labs Lab 05/14/14 1628  TSH 1.080   Coagulation: No results found for this basename: LABPROT, INR,  in the last 168 hours Anemia Panel: No results found for this basename: VITAMINB12, FOLATE, FERRITIN, TIBC, IRON, RETICCTPCT,  in the last 168 hours Urine Drug Screen: Drugs of Abuse  No results found for this basename: labopia, cocainscrnur, labbenz, amphetmu, thcu, labbarb    Alcohol Level: No results found for this basename: ETH,  in the last 168 hours Urinalysis:  Recent Labs Lab 05/14/14 1828  COLORURINE YELLOW  LABSPEC 1.008  PHURINE 7.0  GLUCOSEU NEGATIVE  HGBUR NEGATIVE  BILIRUBINUR NEGATIVE  KETONESUR NEGATIVE  PROTEINUR NEGATIVE  UROBILINOGEN 0.2  NITRITE NEGATIVE  LEUKOCYTESUR NEGATIVE     Micro Results: No results found for this or any previous visit (from the past 240 hour(s)). Studies/Results: Dg Chest 2 View  05/14/2014   CLINICAL DATA:  Altered mental status.  EXAM: CHEST  2 VIEW  COMPARISON:  None.  FINDINGS: Mediastinum and hilar structures are normal. Cardiomegaly with normal pulmonary vascularity. Mild basilar atelectasis. Multiple left rib fractures are noted. Degenerative changes thoracic spine.  IMPRESSION: 1. Mild basilar atelectasis. 2. Cardiomegaly, normal pulmonary vascularity. 3. Old left rib fractures.   Electronically Signed   By: [place]   On: 05/14/2014 17:31   Ct Head Wo Contrast  05/14/2014   CLINICAL DATA:  Headache. Vertigo. High  blood pressure. Hyperlipidemia.  EXAM: CT HEAD WITHOUT CONTRAST  TECHNIQUE: Contiguous axial images were obtained from the base of the skull through the vertex without intravenous contrast.  COMPARISON:  None.  FINDINGS: No intracranial hemorrhage.  Small vessel disease type changes.  Question posterior right frontal lobe infarct?  No intracranial mass lesion noted on this unenhanced exam.  No hydrocephalus.  Visualized mastoid air cells, middle ear cavities and visualized paranasal sinus are clear.  Mild exophthalmos.  Calcification internal carotid artery cavernous segment.  IMPRESSION: No intracranial hemorrhage.  Small vessel disease type changes.  Question posterior right frontal lobe infarct?   Electronically Signed   By: Chauncey Cruel M.D.   On: 05/14/2014 17:17   Medications: I have reviewed the patient's current medications. Scheduled Meds: . aspirin  81 mg Oral Daily  . donepezil  5 mg Oral Daily  . enoxaparin (LOVENOX) injection  40 mg Subcutaneous Q24H  . labetalol  200 mg Oral BID  . nystatin   Topical BID  . sodium chloride  3 mL Intravenous Q12H  . triamcinolone   Topical BID   Continuous Infusions: . sodium chloride 125 mL/hr at 05/14/14 2353   PRN Meds:.acetaminophen, acetaminophen, albuterol Assessment/Plan: Ms. Somma is a 79yo woman w/ PMHx of COPD, HTN, HLD, and peripheral vascular disease who presented to the ED for weakness likely 2/2 heat exhaustion.   1. Heat Exhaustion: Pt presented to ED after feeling weak and having a possible syncopal episode after sitting in the sun for 2 hours. She admits to poor hydration. Vital signs on admission : T 100.6, hypertensive at 170/64, HR 76, RR 18, pulse ox 97% on 2 L. Labs show leukocytosis of 20.1 and hyponatremia of 124. CK 691. Physical exam was unremarkable. No neurologic deficits, strength 5/5 in all extremities. No confusion. Weakness most like due to heat exhaustion and dehydration. Pt felt better after 500 mL NS bolus in the  ED. Symptoms are not likely due to CVA. Pt has no history of stroke or seizure. CT head without contrast shows no intracranial hemorrhage, shows small vessel disease type changes, and questionable posterior right frontal lobe infarct. This finding was discussed in person with radiology who thought it looked chronic in nature and noticed other white matter disease. Upon correlation with physical exam findings, CVA is unlikely. Symptoms are not likely due to infectious etiology. Pt has no complaints of cough, shortness of breath, fever, nausea, vomiting, diarrhea, abdominal or suprapubic pain. However, pt does have a leukocytosis of 20.1 and fever of 100.6 on admission. Lactic acid is normal at 0.85. Orthostatic VS positive. CXR shows mild basilar atelectasis, cardiomegaly, normal pulmonary vascularity, and old left rib fractures. Urinalysis is negative for leukocytes, nitrites and ketones. This AM, patient feeling much better. WBC 16.9, now 20.1. CK  has improved from 691 to 441. Na improved from 124 to 131.  - NS 125 mL/hr  - Check troponin - Repeat EKG - Blood cultures x 2 pending - Urine culture pending - I/Os  - Neuro checks  - Cardiac Diet   2. Lymphocyte-predominant Leukocytosis: Pt has a WBC of 20.1 on admission with predominant lymphocytes 54%. Pt has a history of lymphocyte predominant leukocytosis. She was previously seen by Dr. Lamonte Sakai in July 2013 who thought her leukocytosis was 2/2 recurrent UTIs. Dr. Lamonte Sakai stated if leukocytosis was to persist or progress, to consider referral back to outpatient hematology/oncology. Pt originally febrile on admission, 100.6, now down at 98.7. No complaints of fever, denies dysuria or urinary frequency. Denies cough or shortness of breath. CXR and UA both normal (see above). No obvious signs of infection. Leukocytosis most likely due to heat exhaustion. - Leukocytosis improved to 16.9 from 20.1 - Blood cultures x 2 pending - Urine culture pending  3. [place]idal  Intertrigo: Pt complains of rash between gluteal folds and over sacrum that has persisted for several weeks. She uses triamcinolone cream at home. On physical exam, patient has erythematous, moist skin around her sacral area and between gluteal folds. Punctate lesions with blood. Skin is mildly denuded. Most likely 2/2 fungal infection. No sign sacral ulcer.  - Triamcinolone 0.025% cream BID  - Nystatin topical powder BID  - Wound care consulted   4. Hyponatremia: Pt presents with Na of 124 on admission. History of hyponatremia, chronic in nature. Likely 2/2 hypovolemic hypotonic hyponatremia worsened in the setting of dehydration. Previous notes suggest, chronicity is due to poor po intake. Pt has been ruled out for multiple myeloma in the past. BUN 7, Cr 0.82, no lower extremity edema. Normal neurologic examination.  - Na improved to 131 - NS 125 mL/hr  - Orthostatic vital signs positive - Hold HCTZ: thiazides can increase risk for neuro complications in hyponatremic patients   5. COPD: Pt has history of COPD. On nighttime oxygen via [place] at home. No complaints of shortness of breath, cough or DOE. CXR shows mild basilar atelectasis, cardiomegaly, normal pulmonary vascularity, and old left rib fractures. Pt uses proair at home.  - Proventil albuterol inhaler 1 puff Q6H prn wheezing  - Oxygen via [place] prn   6. HTN: BP on admission 170/64. Pt has not taken her blood pressure medications today. Normally well controlled on BP meds at home. Given labetalol 10 mg IV once in the ED. No complaints of headache. On labetalol, losartan-HCTZ at home  - Labetalol 200 mg BID  - Start Losartan 100 mg daily - Hold HCTZ in setting of hyponatremia   7. H/o Dementia: Niece states patient has been to her primary care physician/nurse practitioner several times in the last few weeks. They're concerned that her "memory is getting difficult". AAOx3. No confusion. Pt on donepezil 5 mg daily at home.  - Continue donepezil  5 mg daily   Diet: Heart healthy DVT/PE ppx: Lovenox 40 mg SQ daily  Dispo: Likely discharge today.   The patient does have a current PCP Suzan Garibaldi, [place]) and does need an [facility] hospital follow-up appointment after discharge.  The patient does not have transportation limitations that hinder transportation to clinic appointments.  .Services Needed at time of discharge: Y = Yes, Blank = No PT:   OT:   RN:   Equipment:   Other:     LOS: 1 day   Albin Felling, MD 05/15/2014, 11:10 AM

## 2014-05-15 NOTE — Discharge Summary (Signed)
Name: Madeline Jimenez MRN: [id number] DOB: [birth year] 79 y.o. PCP: Suzan Garibaldi, FNP  Date of Admission: 05/14/2014  3:11 PM Date of Discharge: 05/15/2014 Attending Physician: Madilyn Fireman, MD  Discharge Diagnosis: 1. Heat Exhaustion 2. Lymphocyte-Predominant Leukocytosis 3. Candidal Intertrigo 4. Hyponatremia 5. COPD 6. HTN 7. H/o Dementia  Discharge Medications:   Medication List    ASK your doctor about these medications       aspirin 81 MG tablet  Take 81 mg by mouth daily.     CEREFOLIN NAC PO  Take 1 tablet by mouth daily.     donepezil 5 MG tablet  Commonly known as:  ARICEPT  Take 5 mg by mouth daily.     labetalol 200 MG tablet  Commonly known as:  NORMODYNE  Take 200 mg by mouth 2 (two) times daily.     losartan-hydrochlorothiazide 100-12.5 MG per tablet  Commonly known as:  HYZAAR  Take 1 tablet by mouth daily.     PROAIR HFA 108 (90 BASE) MCG/ACT inhaler  Generic drug:  albuterol  Inhale 1 puff into the lungs every 6 (six) hours as needed.     triamcinolone cream 0.1 %  Commonly known as:  KENALOG  Apply 1 application topically 2 (two) times daily.        Disposition and follow-up:   Ms.Madeline Jimenez was discharged from [facility] in Good condition.  At the hospital follow up visit please address:  1.  Hyponatremia, decide whether to restart HCTZ, pt started on Amlodipine 2.5 mg daily. Lymphocyte-predominant leukocytosis, consider heme-onc outpatient appointment if persists. Candidal Intertrigo- pt using Triamcinolone topical.   2.  Labs / imaging needed at time of follow-up: BMET  3.  Pending labs/ test needing follow-up: Urine, blood cultures  Follow-up Appointments: Follow-up Information   Follow up with Suzan Garibaldi, FNP. (Appt on August 3rd @ 9:00 AM)    Specialty:  Nurse Practitioner   Contact information:   [place] [address] [phone]       Discharge  Instructions:   Consultations:  None  Procedures Performed:  Dg Chest 2 View  05/14/2014   CLINICAL DATA:  Altered mental status.  EXAM: CHEST  2 VIEW  COMPARISON:  None.  FINDINGS: Mediastinum and hilar structures are normal. Cardiomegaly with normal pulmonary vascularity. Mild basilar atelectasis. Multiple left rib fractures are noted. Degenerative changes thoracic spine.  IMPRESSION: 1. Mild basilar atelectasis. 2. Cardiomegaly, normal pulmonary vascularity. 3. Old left rib fractures.   Electronically Signed   By: [place]   On: 05/14/2014 17:31   Ct Head Wo Contrast  05/14/2014   CLINICAL DATA:  Headache. Vertigo. High blood pressure. Hyperlipidemia.  EXAM: CT HEAD WITHOUT CONTRAST  TECHNIQUE: Contiguous axial images were obtained from the base of the skull through the vertex without intravenous contrast.  COMPARISON:  None.  FINDINGS: No intracranial hemorrhage.  Small vessel disease type changes.  Question posterior right frontal lobe infarct?  No intracranial mass lesion noted on this unenhanced exam.  No hydrocephalus.  Visualized mastoid air cells, middle ear cavities and visualized paranasal sinus are clear.  Mild exophthalmos.  Calcification internal carotid artery cavernous segment.  IMPRESSION: No intracranial hemorrhage.  Small vessel disease type changes.  Question posterior right frontal lobe infarct?   Electronically Signed   By: Chauncey Cruel M.D.   On: 05/14/2014 17:17    Admission HPI: Madeline Jimenez is a 79yo woman with PMHx of COPD, HTN,  HLD, and PVD who presented to the ED with complaint of weakness. Pt states she was sitting outside today in the sun for one to two hours, then proceeded to try to stand up and couldn't get out of her chair. She states her legs were too weak. Her niece's husband saw her attempts and came outside to help her. At that point, the husband said the patient passed out, but the patient does not remember losing consciousness. No urinary or bowel  incontinence. EMS was called and patient was brought to the ED. Upon arrival to the ED, pt was diaphoretic, "hot." Cold packs were placed in the patients axillas and groin. Pt states she hasn't drank many fluids today and that she hadn't taken her medications. She denied any headache, chest pain, shortness of breath, nausea, dizziness, change in vision, hearing or speech, or confusion. Pt states she feels much better now and denies any complaints.      Hospital Course by problem list:  1. Heat Exhaustion: Pt presented to ED after feeling weak and having a possible syncopal episode after sitting in the sun for 2 hours. She admits to poor hydration. Vital signs on admission : T 100.6, hypertensive at 170/64, HR 76, RR 18, pulse ox 97% on 2 L. Labs show leukocytosis of 20.1 and hyponatremia of 124. CK 691. Physical exam was unremarkable. No neurologic deficits, strength intact, no confusion. Weakness most like due to heat exhaustion and dehydration. Pt felt better after 500 mL NS bolus in the ED. Symptoms are not likely due to CVA. Pt has no history of stroke or seizure. CT head without contrast shows no intracranial hemorrhage, shows small vessel disease type changes, and questionable posterior right frontal lobe infarct. This finding was discussed in person with radiology who thought it looked chronic in nature and noticed other white matter disease. Pt has no complaints of cough, shortness of breath, fever, nausea, vomiting, diarrhea, abdominal or suprapubic pain. However, pt does have a leukocytosis of 20.1 and fever of 100.6 on admission. Lactic acid is normal at 0.85. Orthostatic VS positive. CXR shows mild basilar atelectasis, cardiomegaly, normal pulmonary vascularity, and old left rib fractures. Urinalysis is negative for leukocytes, nitrites and ketones. This AM, patient feeling much better. WBC now 16.9.  CK has improved from 691 to 441. Na improved from 124 to 131. Repeat EKG shows sinus bradycardia,  which appears to be patient's baseline. Troponin negative x 1. PT evaluated patient and recommends rolling walker and PT home care. These were ordered at discharge.  2. Lymphocyte-Predominant Leukocytosis: Pt has a WBC of 20.1 on admission with predominant lymphocytes 54%. Pt has a history of lymphocyte predominant leukocytosis. She was previously seen by Dr. Lamonte Sakai in July 2013 who thought her leukocytosis was 2/2 recurrent UTIs. Dr. Lamonte Sakai stated if leukocytosis was to persist or progress, to consider referral back to outpatient hematology/oncology. Pt originally febrile on admission, 100.6, now down at 98.7. No complaints of fever, denies dysuria or urinary frequency. Denies cough or shortness of breath. CXR and UA both normal (see above). No obvious signs of infection. Leukocytosis most likely due to heat exhaustion. WBC count improved to 16.9. Consider outpatient heme-onc follow-up if leukocytosis persists.  3. Candidal Intertrigo: Pt complains of rash between gluteal folds and over sacrum that has persisted for several weeks. She uses triamcinolone cream at home. On physical exam, patient has erythematous, moist skin around her sacral area and between gluteal folds. Punctate lesions with blood. Skin is mildly denuded. Most  likely 2/2 fungal infection. No sign sacral ulcer. Patient uses Triamcinolone 0.025% cream BID at home. Nystatin topical powder BID was added to her current regimen.  Patient to have outpatient follow-up.  4. Hyponatremia: Pt presents with Na of 124 on admission. History of hyponatremia, chronic in nature. Likely 2/2 hypovolemic hypotonic hyponatremia worsened in the setting of dehydration. Previous notes suggest, chronicity is due to poor po intake. Pt has been ruled out for multiple myeloma in the past. BUN 7, Cr 0.82, no lower extremity edema. Normal neurologic examination. Orthostatic VS positive. This AM, Na improved to 131, patient asymptomatic. HCTZ was held during hospitalization and  discontinued at discharge because of risk of neurological complications in hyponatremic patients. Patient to have outpatient follow-up to determine if HCTZ should be resumed.  5. COPD: Pt has history of COPD. On nighttime oxygen via [place] at home. No complaints of shortness of breath, cough or DOE. CXR shows mild basilar atelectasis, cardiomegaly, normal pulmonary vascularity, and old left rib fractures. Pt uses proair at home. Patient placed on Proventil albuterol inhaler and O2 via [place] PRN.  6. HTN: BP on admission 170/64. Pt had not taken her blood pressure medications yesterday. Normally well controlled on BP meds at home. Given labetalol 10 mg IV once in the ED. No complaints of headache. On labetalol, losartan-HCTZ at home. Patient given home Labetalol 200 mg and started on Losartan 100 mg daily. Pt also started on Amlodipine 2.5 mg daily. HCTZ held during hospitalization and at discharge because of risk of neurological complications in hyponatremic patients. Patient to have outpatient follow-up.   7. H/o Dementia: Niece states patient has been to her primary care physician/nurse practitioner several times in the last few weeks. They're concerned that her "memory is getting difficult". AAOx3. No confusion. Pt on donepezil 5 mg daily at home. This medication was continued.    Discharge Vitals:   BP 187/73  Pulse 76  Temp(Src) 97.9 F (36.6 C) (Oral)  Resp 18  Ht 5' 4.5" (1.638 m)  Wt 157 lb 10.1 oz (71.5 kg)  BMI 26.65 kg/m2  SpO2 100% Physical Exam  General: laying in bed, cooperative, NAD  HEENT: [place]/AT, EOMI, PERRL, mucus membranes moist  Neck: supple, no JVD  CV: RRR, normal S1/S2, no m/g/r  Pulm: CTA bilaterally, breaths non-labored  Abd: BS+, soft, non-distended, non-tender  Ext: warm, no edema, moves all  Neuro: alert and oriented x 3, strength 5/5 in upper and lower extremities, CNs II-XII intact  Skin: erythematous, moist skin in sacral area  Discharge Labs:  Results for  orders placed during the hospital encounter of 05/14/14 (from the past 24 hour(s))  CBC WITH DIFFERENTIAL     Status: Abnormal   Collection Time    05/14/14  4:28 PM      Result Value Ref Range   WBC 20.1 (*) 4.0 - 10.5 K/uL   RBC 3.62 (*) 3.87 - 5.11 MIL/uL   Hemoglobin 11.4 (*) 12.0 - 15.0 g/dL   HCT 33.3 (*) 36.0 - 46.0 %   MCV 92.0  78.0 - 100.0 fL   MCH 31.5  26.0 - 34.0 pg   MCHC 34.2  30.0 - 36.0 g/dL   RDW 12.9  11.5 - 15.5 %   Platelets 207  150 - 400 K/uL   Neutrophils Relative % 43  43 - 77 %   Lymphocytes Relative 54 (*) 12 - 46 %   Monocytes Relative 3  3 - 12 %   Eosinophils Relative  0  0 - 5 %   Basophils Relative 0  0 - 1 %   Neutro Abs 8.6 (*) 1.7 - 7.7 K/uL   Lymphs Abs 10.9 (*) 0.7 - 4.0 K/uL   Monocytes Absolute 0.6  0.1 - 1.0 K/uL   Eosinophils Absolute 0.0  0.0 - 0.7 K/uL   Basophils Absolute 0.0  0.0 - 0.1 K/uL   WBC Morphology TOXIC GRANULATION     Smear Review LARGE PLATELETS PRESENT    COMPREHENSIVE METABOLIC PANEL     Status: Abnormal   Collection Time    05/14/14  4:28 PM      Result Value Ref Range   Sodium 124 (*) 137 - 147 mEq/L   Potassium 4.2  3.7 - 5.3 mEq/L   Chloride 84 (*) 96 - 112 mEq/L   CO2 28  19 - 32 mEq/L   Glucose, Bld 93  70 - 99 mg/dL   BUN 7  6 - 23 mg/dL   Creatinine, Ser 0.82  0.50 - 1.10 mg/dL   Calcium 9.2  8.4 - 10.5 mg/dL   Total Protein 6.4  6.0 - 8.3 g/dL   Albumin 3.9  3.5 - 5.2 g/dL   AST 50 (*) 0 - 37 U/L   ALT 27  0 - 35 U/L   Alkaline Phosphatase 78  39 - 117 U/L   Total Bilirubin 0.8  0.3 - 1.2 mg/dL   GFR calc non Af Amer 67 (*) >90 mL/min   GFR calc Af Amer 78 (*) >90 mL/min   Anion gap 12  5 - 15  TSH     Status: None   Collection Time    05/14/14  4:28 PM      Result Value Ref Range   TSH 1.080  0.350 - 4.500 uIU/mL  CK     Status: Abnormal   Collection Time    05/14/14  4:28 PM      Result Value Ref Range   Total CK 691 (*) 7 - 177 U/L  MAGNESIUM     Status: None   Collection Time    05/14/14   4:28 PM      Result Value Ref Range   Magnesium 1.6  1.5 - 2.5 mg/dL  URINALYSIS, ROUTINE W REFLEX MICROSCOPIC     Status: None   Collection Time    05/14/14  6:28 PM      Result Value Ref Range   Color, Urine YELLOW  YELLOW   APPearance CLEAR  CLEAR   Specific Gravity, Urine 1.008  1.005 - 1.030   pH 7.0  5.0 - 8.0   Glucose, UA NEGATIVE  NEGATIVE mg/dL   Hgb urine dipstick NEGATIVE  NEGATIVE   Bilirubin Urine NEGATIVE  NEGATIVE   Ketones, ur NEGATIVE  NEGATIVE mg/dL   Protein, ur NEGATIVE  NEGATIVE mg/dL   Urobilinogen, UA 0.2  0.0 - 1.0 mg/dL   Nitrite NEGATIVE  NEGATIVE   Leukocytes, UA NEGATIVE  NEGATIVE  I-STAT CG4 LACTIC ACID, ED     Status: None   Collection Time    05/14/14  6:29 PM      Result Value Ref Range   Lactic Acid, Venous 0.85  0.5 - 2.2 mmol/L  COMPREHENSIVE METABOLIC PANEL     Status: Abnormal   Collection Time    05/15/14  6:20 AM      Result Value Ref Range   Sodium 131 (*) 137 - 147 mEq/L   Potassium 4.2  3.7 -  5.3 mEq/L   Chloride 92 (*) 96 - 112 mEq/L   CO2 27  19 - 32 mEq/L   Glucose, Bld 97  70 - 99 mg/dL   BUN 7  6 - 23 mg/dL   Creatinine, Ser 0.77  0.50 - 1.10 mg/dL   Calcium 8.8  8.4 - 10.5 mg/dL   Total Protein 5.8 (*) 6.0 - 8.3 g/dL   Albumin 3.5  3.5 - 5.2 g/dL   AST 37  0 - 37 U/L   ALT 22  0 - 35 U/L   Alkaline Phosphatase 74  39 - 117 U/L   Total Bilirubin 0.7  0.3 - 1.2 mg/dL   GFR calc non Af Amer 79 (*) >90 mL/min   GFR calc Af Amer >90  >90 mL/min   Anion gap 12  5 - 15  CBC WITH DIFFERENTIAL     Status: Abnormal   Collection Time    05/15/14  6:20 AM      Result Value Ref Range   WBC 16.9 (*) 4.0 - 10.5 K/uL   RBC 3.42 (*) 3.87 - 5.11 MIL/uL   Hemoglobin 10.7 (*) 12.0 - 15.0 g/dL   HCT 31.7 (*) 36.0 - 46.0 %   MCV 92.7  78.0 - 100.0 fL   MCH 31.3  26.0 - 34.0 pg   MCHC 33.8  30.0 - 36.0 g/dL   RDW 13.3  11.5 - 15.5 %   Platelets 203  150 - 400 K/uL   Neutrophils Relative % 31 (*) 43 - 77 %   Lymphocytes Relative 64  (*) 12 - 46 %   Monocytes Relative 4  3 - 12 %   Eosinophils Relative 1  0 - 5 %   Basophils Relative 0  0 - 1 %   Neutro Abs 5.2  1.7 - 7.7 K/uL   Lymphs Abs 10.8 (*) 0.7 - 4.0 K/uL   Monocytes Absolute 0.7  0.1 - 1.0 K/uL   Eosinophils Absolute 0.2  0.0 - 0.7 K/uL   Basophils Absolute 0.0  0.0 - 0.1 K/uL   Smear Review MORPHOLOGY UNREMARKABLE    CK     Status: Abnormal   Collection Time    05/15/14  6:20 AM      Result Value Ref Range   Total CK 441 (*) 7 - 177 U/L  TROPONIN I     Status: None   Collection Time    05/15/14 12:00 PM      Result Value Ref Range   Troponin I <0.30  <0.30 ng/mL    Signed: Albin Felling, MD 05/15/2014, 1:31 PM    Services Ordered on Discharge: Home PT Equipment Ordered on Discharge: Rolling walker

## 2014-05-15 NOTE — Discharge Instructions (Signed)
It was a pleasure taking care of you, [name]. Please do not take your HCTZ medication until your follow-up appointment with Marin Commentheryl Wells, FNP. You have an appointment on Monday, August 3rd at 9:00 AM with her. Also, please remember to drink plenty of fluids!

## 2014-05-16 LAB — URINE CULTURE
COLONY COUNT: NO GROWTH
Culture: NO GROWTH

## 2014-05-16 NOTE — Discharge Summary (Signed)
INTERNAL MEDICINE ATTENDING DISCHARGE COSIGN   I evaluated the patient on the day of discharge and discussed the discharge plan with my resident team. I agree with the discharge documentation and disposition.   Aletta EdouardBHARDWAJ, Atley [place] 05/16/2014, 10:32 AM

## 2014-05-18 NOTE — Progress Notes (Signed)
Late entry for missed g-Code. Based on review of evaluation performed by Prudencio PairMaija Prater, PT.  05/15/14 1415  PT G-Codes **NOT FOR INPATIENT CLASS**  Functional Assessment Tool Used Clinical judgement based on chart review  Functional Limitation Mobility: Walking and moving around  Mobility: Walking and Moving Around Current Status (Z6109(G8978) CI  Mobility: Walking and Moving Around Goal Status (U0454(G8979) CI

## 2014-05-21 LAB — CULTURE, BLOOD (ROUTINE X 2)
CULTURE: NO GROWTH
Culture: NO GROWTH

## 2014-07-20 ENCOUNTER — Encounter: Payer: Self-pay | Admitting: Internal Medicine

## 2014-07-20 ENCOUNTER — Ambulatory Visit: Payer: Self-pay | Admitting: Internal Medicine

## 2014-07-20 ENCOUNTER — Ambulatory Visit: Payer: Medicare Other | Admitting: Internal Medicine

## 2014-07-20 NOTE — Progress Notes (Signed)
Patient ID: Bynum BellowsDeborah E Jimenez, female   DOB: [birth year], 79 y.o.   MRN: [id number]  Letter mailed to patient to call back and reschedule.

## 2014-08-07 ENCOUNTER — Emergency Department (HOSPITAL_COMMUNITY): Payer: Medicare Other

## 2014-08-07 ENCOUNTER — Encounter (HOSPITAL_COMMUNITY): Payer: Self-pay | Admitting: Emergency Medicine

## 2014-08-07 ENCOUNTER — Inpatient Hospital Stay (HOSPITAL_COMMUNITY)
Admission: EM | Admit: 2014-08-07 | Discharge: 2014-08-09 | DRG: 641 | Disposition: A | Discharge status: Home / Self Care | Payer: Medicare Other | Attending: Internal Medicine | Admitting: Internal Medicine

## 2014-08-07 DIAGNOSIS — I10 Essential (primary) hypertension: Secondary | ICD-10-CM | POA: Diagnosis present

## 2014-08-07 DIAGNOSIS — D72820 Lymphocytosis (symptomatic): Secondary | ICD-10-CM | POA: Diagnosis present

## 2014-08-07 DIAGNOSIS — M549 Dorsalgia, unspecified: Secondary | ICD-10-CM | POA: Diagnosis present

## 2014-08-07 DIAGNOSIS — J449 Chronic obstructive pulmonary disease, unspecified: Secondary | ICD-10-CM | POA: Diagnosis present

## 2014-08-07 DIAGNOSIS — D649 Anemia, unspecified: Secondary | ICD-10-CM | POA: Diagnosis present

## 2014-08-07 DIAGNOSIS — E871 Hypo-osmolality and hyponatremia: Secondary | ICD-10-CM | POA: Diagnosis present

## 2014-08-07 DIAGNOSIS — R55 Syncope and collapse: Secondary | ICD-10-CM

## 2014-08-07 DIAGNOSIS — F17210 Nicotine dependence, cigarettes, uncomplicated: Secondary | ICD-10-CM | POA: Diagnosis present

## 2014-08-07 DIAGNOSIS — G8929 Other chronic pain: Secondary | ICD-10-CM | POA: Diagnosis present

## 2014-08-07 DIAGNOSIS — I739 Peripheral vascular disease, unspecified: Secondary | ICD-10-CM | POA: Diagnosis present

## 2014-08-07 DIAGNOSIS — E785 Hyperlipidemia, unspecified: Secondary | ICD-10-CM | POA: Diagnosis present

## 2014-08-07 DIAGNOSIS — R569 Unspecified convulsions: Secondary | ICD-10-CM | POA: Diagnosis present

## 2014-08-07 DIAGNOSIS — Z79899 Other long term (current) drug therapy: Secondary | ICD-10-CM

## 2014-08-07 LAB — COMPREHENSIVE METABOLIC PANEL
ALK PHOS: 66 U/L (ref 39–117)
ALT: 19 U/L (ref 0–35)
ANION GAP: 11 (ref 5–15)
AST: 29 U/L (ref 0–37)
Albumin: 3.8 g/dL (ref 3.5–5.2)
BUN: 13 mg/dL (ref 6–23)
CHLORIDE: 77 meq/L — AB (ref 96–112)
CO2: 28 mEq/L (ref 19–32)
CREATININE: 0.92 mg/dL (ref 0.50–1.10)
Calcium: 9.3 mg/dL (ref 8.4–10.5)
GFR calc non Af Amer: 59 mL/min — ABNORMAL LOW (ref >90)
GFR, EST AFRICAN AMERICAN: 68 mL/min — AB (ref >90)
GLUCOSE: 101 mg/dL — AB (ref 70–99)
POTASSIUM: 4.5 meq/L (ref 3.7–5.3)
Sodium: 116 mEq/L — CL (ref 137–147)
Total Bilirubin: 0.7 mg/dL (ref 0.3–1.2)
Total Protein: 6.2 g/dL (ref 6.0–8.3)

## 2014-08-07 LAB — RETICULOCYTES
RBC.: 3.34 MIL/uL — ABNORMAL LOW (ref 3.87–5.11)
Retic Count, Absolute: 50.1 10*3/uL (ref 19.0–186.0)
Retic Ct Pct: 1.5 % (ref 0.4–3.1)

## 2014-08-07 LAB — CBC WITH DIFFERENTIAL/PLATELET
BASOS PCT: 0 % (ref 0–1)
Basophils Absolute: 0 10*3/uL (ref 0.0–0.1)
EOS ABS: 0.2 10*3/uL (ref 0.0–0.7)
Eosinophils Relative: 1 % (ref 0–5)
HEMATOCRIT: 28.6 % — AB (ref 36.0–46.0)
Hemoglobin: 10.2 g/dL — ABNORMAL LOW (ref 12.0–15.0)
LYMPHS PCT: 57 % — AB (ref 12–46)
Lymphs Abs: 9.5 10*3/uL — ABNORMAL HIGH (ref 0.7–4.0)
MCH: 31 pg (ref 26.0–34.0)
MCHC: 35.7 g/dL (ref 30.0–36.0)
MCV: 86.9 fL (ref 78.0–100.0)
MONOS PCT: 4 % (ref 3–12)
Monocytes Absolute: 0.7 10*3/uL (ref 0.1–1.0)
Neutro Abs: 6.3 10*3/uL (ref 1.7–7.7)
Neutrophils Relative %: 38 % — ABNORMAL LOW (ref 43–77)
Platelets: 209 10*3/uL (ref 150–400)
RBC: 3.29 MIL/uL — ABNORMAL LOW (ref 3.87–5.11)
RDW: 12.6 % (ref 11.5–15.5)
WBC: 16.7 10*3/uL — ABNORMAL HIGH (ref 4.0–10.5)

## 2014-08-07 LAB — MRSA PCR SCREENING: MRSA BY PCR: POSITIVE — AB

## 2014-08-07 LAB — TSH: TSH: 0.927 u[IU]/mL (ref 0.350–4.500)

## 2014-08-07 LAB — URINE MICROSCOPIC-ADD ON

## 2014-08-07 LAB — CBG MONITORING, ED: Glucose-Capillary: 116 mg/dL — ABNORMAL HIGH (ref 70–99)

## 2014-08-07 LAB — BASIC METABOLIC PANEL
Anion gap: 11 (ref 5–15)
Anion gap: 9 (ref 5–15)
BUN: 12 mg/dL (ref 6–23)
BUN: 12 mg/dL (ref 6–23)
CO2: 29 meq/L (ref 19–32)
CO2: 29 meq/L (ref 19–32)
Calcium: 9.2 mg/dL (ref 8.4–10.5)
Calcium: 9.4 mg/dL (ref 8.4–10.5)
Chloride: 78 mEq/L — ABNORMAL LOW (ref 96–112)
Chloride: 82 mEq/L — ABNORMAL LOW (ref 96–112)
Creatinine, Ser: 0.84 mg/dL (ref 0.50–1.10)
Creatinine, Ser: 1.16 mg/dL — ABNORMAL HIGH (ref 0.50–1.10)
GFR calc Af Amer: 76 mL/min — ABNORMAL LOW (ref >90)
GFR calc Af Amer: 51 mL/min — ABNORMAL LOW (ref >90)
GFR calc non Af Amer: 65 mL/min — ABNORMAL LOW (ref >90)
GFR calc non Af Amer: 44 mL/min — ABNORMAL LOW (ref >90)
GLUCOSE: 104 mg/dL — AB (ref 70–99)
Glucose, Bld: 133 mg/dL — ABNORMAL HIGH (ref 70–99)
Potassium: 4.3 mEq/L (ref 3.7–5.3)
Potassium: 4.2 mEq/L (ref 3.7–5.3)
SODIUM: 118 meq/L — AB (ref 137–147)
SODIUM: 120 meq/L — AB (ref 137–147)

## 2014-08-07 LAB — FOLATE

## 2014-08-07 LAB — IRON AND TIBC
Iron: 100 ug/dL (ref 42–135)
SATURATION RATIOS: 35 % (ref 20–55)
TIBC: 287 ug/dL (ref 250–470)
UIBC: 187 ug/dL (ref 125–400)

## 2014-08-07 LAB — URINALYSIS, ROUTINE W REFLEX MICROSCOPIC
Bilirubin Urine: NEGATIVE
Glucose, UA: NEGATIVE mg/dL
Ketones, ur: NEGATIVE mg/dL
Nitrite: NEGATIVE
PH: 6.5 (ref 5.0–8.0)
Protein, ur: NEGATIVE mg/dL
Specific Gravity, Urine: 1.007 (ref 1.005–1.030)
Urobilinogen, UA: 1 mg/dL (ref 0.0–1.0)

## 2014-08-07 LAB — SODIUM, URINE, RANDOM: Sodium, Ur: <20 mEq/L

## 2014-08-07 LAB — TROPONIN I: Troponin I: <0.3 ng/mL (ref <0.30)

## 2014-08-07 LAB — CREATININE, URINE, RANDOM: CREATININE, URINE: 53.02 mg/dL

## 2014-08-07 LAB — UREA NITROGEN, URINE: UREA NITROGEN UR: 284 mg/dL

## 2014-08-07 LAB — FERRITIN: FERRITIN: 186 ng/mL (ref 10–291)

## 2014-08-07 LAB — PROTIME-INR
INR: 1.23 (ref 0.00–1.49)
PROTHROMBIN TIME: 15.6 s — AB (ref 11.6–15.2)

## 2014-08-07 LAB — VITAMIN B12: Vitamin B-12: 907 pg/mL (ref 211–911)

## 2014-08-07 LAB — OSMOLALITY, URINE: Osmolality, Ur: 187 mOsm/kg — ABNORMAL LOW (ref 390–1090)

## 2014-08-07 LAB — SAVE SMEAR

## 2014-08-07 MED ORDER — ADULT MULTIVITAMIN W/MINERALS CH
1.0000 | ORAL_TABLET | Freq: Every day | ORAL | Status: DC
Start: 2014-08-07 — End: 2014-08-09
  Administered 2014-08-07 – 2014-08-09 (×3): 1 via ORAL
  Filled 2014-08-07 (×3): qty 1

## 2014-08-07 MED ORDER — LORAZEPAM 2 MG/ML IJ SOLN: 1.0000 mg | Freq: Four times a day (QID) | INTRAMUSCULAR | Status: DC | PRN

## 2014-08-07 MED ORDER — SODIUM CHLORIDE 0.9 % IV SOLN
INTRAVENOUS | Status: DC
  Administered 2014-08-07 (×2): via INTRAVENOUS

## 2014-08-07 MED ORDER — VITAMIN B-1 100 MG PO TABS
100.0000 mg | ORAL_TABLET | Freq: Every day | ORAL | Status: DC
  Administered 2014-08-07 – 2014-08-09 (×3): 100 mg via ORAL
  Filled 2014-08-07 (×3): qty 1

## 2014-08-07 MED ORDER — INFLUENZA VAC SPLIT QUAD 0.5 ML IM SUSY
0.5000 mL | PREFILLED_SYRINGE | INTRAMUSCULAR | Status: AC
  Administered 2014-08-08: 0.5 mL via INTRAMUSCULAR
  Filled 2014-08-07: qty 0.5

## 2014-08-07 MED ORDER — CHLORHEXIDINE GLUCONATE CLOTH 2 % EX PADS
6.0000 | MEDICATED_PAD | Freq: Every day | CUTANEOUS | Status: DC
  Administered 2014-08-08 – 2014-08-09 (×2): 6 via TOPICAL

## 2014-08-07 MED ORDER — THIAMINE HCL 100 MG/ML IJ SOLN
100.0000 mg | Freq: Every day | INTRAMUSCULAR | Status: DC
  Filled 2014-08-07: qty 1

## 2014-08-07 MED ORDER — SODIUM CHLORIDE 1 G PO TABS
2.0000 g | ORAL_TABLET | Freq: Three times a day (TID) | ORAL | Status: DC
  Administered 2014-08-07 – 2014-08-08 (×3): 2 g via ORAL
  Filled 2014-08-07 (×6): qty 2

## 2014-08-07 MED ORDER — ACETAMINOPHEN 650 MG RE SUPP: 650.0000 mg | Freq: Four times a day (QID) | RECTAL | Status: DC | PRN

## 2014-08-07 MED ORDER — TIOTROPIUM BROMIDE MONOHYDRATE 18 MCG IN CAPS
18.0000 ug | ORAL_CAPSULE | Freq: Every day | RESPIRATORY_TRACT | Status: DC
  Administered 2014-08-08: 18 ug via RESPIRATORY_TRACT
  Filled 2014-08-07: qty 5

## 2014-08-07 MED ORDER — LORAZEPAM 1 MG PO TABS
1.0000 mg | ORAL_TABLET | Freq: Four times a day (QID) | ORAL | Status: DC | PRN
  Administered 2014-08-08: 1 mg via ORAL
  Filled 2014-08-07: qty 1

## 2014-08-07 MED ORDER — HEPARIN SODIUM (PORCINE) 5000 UNIT/ML IJ SOLN
5000.0000 [IU] | Freq: Three times a day (TID) | INTRAMUSCULAR | Status: DC
  Administered 2014-08-07 – 2014-08-09 (×6): 5000 [IU] via SUBCUTANEOUS
  Filled 2014-08-07 (×9): qty 1

## 2014-08-07 MED ORDER — ACETAMINOPHEN 325 MG PO TABS: 650.0000 mg | ORAL_TABLET | Freq: Four times a day (QID) | ORAL | Status: DC | PRN

## 2014-08-07 MED ORDER — FOLIC ACID 1 MG PO TABS
1.0000 mg | ORAL_TABLET | Freq: Every day | ORAL | Status: DC
  Administered 2014-08-07 – 2014-08-09 (×3): 1 mg via ORAL
  Filled 2014-08-07 (×3): qty 1

## 2014-08-07 MED ORDER — ALBUTEROL SULFATE (2.5 MG/3ML) 0.083% IN NEBU
3.0000 mL | INHALATION_SOLUTION | Freq: Four times a day (QID) | RESPIRATORY_TRACT | Status: DC | PRN
  Administered 2014-08-08: 3 mL via RESPIRATORY_TRACT
  Filled 2014-08-07: qty 3

## 2014-08-07 MED ORDER — MUPIROCIN 2 % EX OINT
1.0000 "application " | TOPICAL_OINTMENT | Freq: Two times a day (BID) | CUTANEOUS | Status: DC
  Administered 2014-08-07 – 2014-08-09 (×4): 1 via NASAL
  Filled 2014-08-07 (×2): qty 22

## 2014-08-07 MED ORDER — SODIUM CHLORIDE 0.9 % IJ SOLN
3.0000 mL | Freq: Two times a day (BID) | INTRAMUSCULAR | Status: DC
  Administered 2014-08-07 – 2014-08-09 (×4): 3 mL via INTRAVENOUS

## 2014-08-07 MED ORDER — LOSARTAN POTASSIUM 50 MG PO TABS
100.0000 mg | ORAL_TABLET | Freq: Every day | ORAL | Status: DC
  Administered 2014-08-07 – 2014-08-09 (×3): 100 mg via ORAL
  Filled 2014-08-07 (×4): qty 2

## 2014-08-07 MED ORDER — PNEUMOCOCCAL VAC POLYVALENT 25 MCG/0.5ML IJ INJ
0.5000 mL | INJECTION | INTRAMUSCULAR | Status: AC
  Administered 2014-08-08: 0.5 mL via INTRAMUSCULAR
  Filled 2014-08-07: qty 0.5

## 2014-08-07 NOTE — H&P (Signed)
Pt seen and examined with Dr. Ethelene Hal. Please see resident note for details.   In brief, 79 y/o female with PMH of COPD, HTN, Hyperlipidemia, PVD, hyponatremia who p/w fall * 1 episode. Pt states that she went to the kitchen to get water and lost her balance and fell. She states that she was awake the whole time and had no lightheadedness, diaphoresis or CP. Pt states that she normally drinks 6 beers a day and water in between the beers but does not eat much. Pt denies HA, blurry vision, syncope, abd pain, diarrhea, n/v and SOB.   Per niece pt became dizzy ad fell and after being helped to the chair she lost consciousness and had urinary incontinence and defecated. She lost consciousness for approx 1 minute and was confused when she woke up. Remaining ROS negative  Exam: Gen: AAO*3, NAD CV: RRR, normal heart sounds Pulm: CTA b/l Abd: soft, non tender, BS + Ext: no pedal edema  Assessment and Plan: 79 y/o female with possible seizure likely secondary to severe hyponatremia  Severe hyponatremia: - Likely secondary to beer potomania - Pt with decreased solute intake and increased beer intake which likely led to her hyponatremia - Will check serum osm, urine osm and urine sodium - c/w normal saline. Add salt tabs  - d/c HCTZ - nephrology following  Seizure: - likely secondary to hyponatremia - neuro consul noted. No further w/u for now - CT head/C spine wnl  Lymphocyte predominant leukocytosis: - Uncertain etiology - Will need heme evaluation as an outpatient - check peripheral smear - Pt will likely need bone marrow biopsy  Accelerated HTN: - Hold HCTZ. Resume losartan 100 mg and start labetolol 200 mg bid  Case d/w pt and residents in detail

## 2014-08-07 NOTE — ED Notes (Signed)
Admitting MD at bedside.

## 2014-08-07 NOTE — ED Notes (Addendum)
Dr Radford PaxBeaton aware of sodium level.

## 2014-08-07 NOTE — ED Notes (Signed)
To ED via [facility] from home, with c/o syncopal episode-- was walking into kitchen, passed out, hit floor, family states had some shaking in muscles and was incontinent. Not post-ictal per EMS.

## 2014-08-07 NOTE — ED Notes (Signed)
Pt returned from CT °

## 2014-08-07 NOTE — H&P (Signed)
Date: 08/07/2014               Patient Name:  Madeline BellowsDeborah E Sloop MRN: [id number]  DOB: 1935-04-02 Age / Sex: 79 y.o., female   PCP: Marin Commentheryl Wells, FNP              Medical Service: Internal Medicine Teaching Service              Attending Physician: Dr. Inez CatalinaEmily B Mullen, MD    First Contact: , MS  Pager: [phone]  Second Contact: Dr.  Dineen KidPager: [phone]  Third Contact Dr. Dineen KidPager: [phone]       After Hours (After 5p/  First Contact Pager: [phone]  weekends / holidays): Second Contact Pager: [phone]   Chief Complaint: "Larey SeatFell and hit head on stove while trying to get water"   History of Present Illness: Letta KocherDeborah Dains is a 79 year old female with a PMH of COPD, HTN, Hyperlipidemia, Peripheral vascular disease, Lymphocytosis and hyponatremia who [facility] with hyponatremia after a fall and potential seizure at home. She was AAO to place, person, time and birth date but seemed slightly confused and lost her train of thought at times. She lives with her husband and niece, Darl PikesSusan, who explained via telephone [phone]) that she sat on a chair after falling and was incontinent of her bladder and bowel in addition to shaking and dropping the alleve when it was handed to her. While unconscious, she continuously opened and closed her hands into fists. She was unconscious for one minute and confused when she woke up. Ms. Rolm BaptiseMcAskill does not recall any of this. She notes nocturia but denies dysuria.    Ms. Rolm BaptiseMcAskill also describes some chronic lower back and joint pain that she takes aspirin to relieve. She denies any chest pain, trouble breathing, constipation or diarrhea. She endorses drinking "6 beers" daily and begins at about noon in the [place]. She also explained that she tries to drink a lot of water between beers. She claims that she had not eaten or drank any beers today. She also said that she falls a lot. She was recently admitted by teaching service in 04/2014 for hyponatremia of 124 in setting  of heat exhaustion.   In the ED, she began NS IVF at 125 mL/hr.   Meds: Current Facility-Administered Medications  Medication Dose Route Frequency Provider Last Rate Last Dose  . 0.9 %  sodium chloride infusion   Intravenous Continuous Fredirick LatheAndrew Peter Wong, MD 125 mL/hr at 08/07/14 1347     Current Outpatient Prescriptions  Medication Sig Dispense Refill  . losartan (COZAAR) 100 MG tablet Take 100 mg by mouth every morning.      Marland Kitchen. losartan-hydrochlorothiazide (HYZAAR) 100-12.5 MG per tablet Take 1 tablet by mouth at bedtime.      Marland Kitchen. PROAIR HFA 108 (90 BASE) MCG/ACT inhaler Inhale 1 puff into the lungs every 6 (six) hours as needed.      . Tiotropium Bromide Monohydrate (SPIRIVA RESPIMAT) 2.5 MCG/ACT AERS Inhale 2.5 mcg into the lungs daily.      Marland Kitchen. triamcinolone cream (KENALOG) 0.1 % Apply 1 application topically 2 (two) times daily as needed (for eczema).         Allergies: Allergies as of 08/07/2014  . (No Known Allergies)   Past Medical History  Diagnosis Date  . COPD (chronic obstructive pulmonary disease)   . HTN (hypertension)   . Hyperlipidemia   . Peripheral vascular disease   . Lymphocytosis   .  Hyponatremia   . Recurrent UTI    Past Surgical History  Procedure Laterality Date  . Abdominal hysterectomy      for fibroid  . Tonsilectomy, adenoidectomy, bilateral myringotomy and tubes     Family History  Problem Relation Age of Onset  . Hypertension Father   . Heart attack Father   . Heart attack Sister    History   Social History  . Marital Status: Widowed    Spouse Name: N/A    Number of Children: 1  . Years of Education: N/A   Occupational History  .      retired Advertising copywriter   Social History Main Topics  . Smoking status: Current Every Chaquetta Schlottman Smoker -- 2.00 packs/Tennie Grussing for 50 years  . Smokeless tobacco: Not on file  . Alcohol Use: No  . Drug Use: No  . Sexual Activity:    Other Topics Concern  . Not on file   Social History Narrative  . No narrative  on file    Review of Systems: A comprehensive review of systems was negative except for: as noted above in HPI  Physical Exam: Blood pressure 176/64, pulse 61, temperature 98.3 F (36.8 C), temperature source Oral, resp. rate 16, SpO2 100.00%.  Gen: A&O x 4, elderly and disheveled, no acute distress  HEENT: Atraumatic, PERRL, EOMI, both eyes slightly injected with clear exudation, moist mucous membranes, poor dentition  Heart: Regular rate and rhythm, normal S1 S2, no murmurs, rubs, or gallops  Lungs: Clear to auscultation bilaterally, respirations unlabored  Abd: Soft, non-tender, non-distended, + bowel sounds, no hepatosplenomegaly  Ext: No edema or cyanosis, 2+ peripheral pulses, no skin tenting  Neuro: CN II-XII intact, strength 5/5 and symmetric in upper and lower extremities, no Babinkski sign  Lab results: Basic Metabolic Panel:   Recent Labs   08/07/14 1239   NA  116*   K  4.5   CL  77*   CO2  28   GLUCOSE  101*   BUN  13   CREATININE  0.92   CALCIUM  9.3    Liver Function Tests:   Recent Labs   08/07/14 1239   AST  29   ALT  19   ALKPHOS  66   BILITOT  0.7   PROT  6.2   ALBUMIN  3.8    CBC:   Recent Labs   08/07/14 1239   WBC  16.7*   NEUTROABS  6.3   HGB  10.2*   HCT  28.6*   MCV  86.9   PLT  209    Cardiac Enzymes:   Recent Labs   08/07/14 1239   TROPONINI  <0.30   CBG:   Recent Labs   08/07/14 1321   GLUCAP  116*    Coagulation:   Recent Labs   08/07/14 1239   LABPROT  15.6*   INR  1.23    Imaging results:  Ct Head Wo Contrast  08/07/2014 CLINICAL DATA: Loss of consciousness. Fell in kitchen and hit floor. EXAM: CT HEAD WITHOUT CONTRAST CT CERVICAL SPINE WITHOUT CONTRAST TECHNIQUE: Multidetector CT imaging of the head and cervical spine was performed following the standard protocol without intravenous contrast. Multiplanar CT image reconstructions of the cervical spine were also generated. COMPARISON: CT scan of head of May 14, 2014. FINDINGS: CT HEAD FINDINGS Bony calvarium appears intact. Minimal diffuse cortical atrophy is noted. Mild chronic ischemic white matter disease is noted. No mass effect or midline shift is  noted. Ventricular size is within normal limits. There is no evidence of mass lesion, hemorrhage or acute infarction. CT CERVICAL SPINE FINDINGS No fracture or spondylolisthesis is noted. Mild degenerative disc disease is noted at C4-5. Severe degenerative disc disease is noted at C5-6 and C6-7 with posterior osteophyte formation. Minimal hypertrophy of posterior facet joints is noted at multiple levels. IMPRESSION: Minimal diffuse cortical atrophy. Mild chronic ischemic white matter disease. No acute intracranial abnormality seen. Multilevel degenerative disc disease is noted. No acute abnormality seen in the cervical spine. Electronically Signed By: Roque LiasJames Green M.D. On: 08/07/2014 13:18   Ct Cervical Spine Wo Contrast  08/07/2014 CLINICAL DATA: Loss of consciousness. Fell in kitchen and hit floor. EXAM: CT HEAD WITHOUT CONTRAST CT CERVICAL SPINE WITHOUT CONTRAST TECHNIQUE: Multidetector CT imaging of the head and cervical spine was performed following the standard protocol without intravenous contrast. Multiplanar CT image reconstructions of the cervical spine were also generated. COMPARISON: CT scan of head of May 14, 2014. FINDINGS: CT HEAD FINDINGS Bony calvarium appears intact. Minimal diffuse cortical atrophy is noted. Mild chronic ischemic white matter disease is noted. No mass effect or midline shift is noted. Ventricular size is within normal limits. There is no evidence of mass lesion, hemorrhage or acute infarction. CT CERVICAL SPINE FINDINGS No fracture or spondylolisthesis is noted. Mild degenerative disc disease is noted at C4-5. Severe degenerative disc disease is noted at C5-6 and C6-7 with posterior osteophyte formation. Minimal hypertrophy of posterior facet joints is noted at multiple levels.  IMPRESSION: Minimal diffuse cortical atrophy. Mild chronic ischemic white matter disease. No acute intracranial abnormality seen. Multilevel degenerative disc disease is noted. No acute abnormality seen in the cervical spine. Electronically Signed By: Roque LiasJames Green M.D. On: 08/07/2014 13:18   Dg Chest Port 1 View  08/07/2014 CLINICAL DATA: Patient experienced an episode of loss of consciousness today and fell in kitchen. Patient has a history of COPD and hypertension. Ex smoker. EXAM: PORTABLE CHEST - 1 VIEW COMPARISON: 05/14/2014. FINDINGS: Cardiac silhouette is mildly enlarged. The aorta is tortuous. No mediastinal or hilar masses or convincing adenopathy. Lungs are clear. No pleural effusion or pneumothorax. Bony thorax is demineralized. There are old left-sided healed rib fractures, stable. IMPRESSION: No acute cardiopulmonary disease. Electronically Signed By: Amie Portlandavid Ormond M.D. On: 08/07/2014 12:54   Other results:  EKG: normal EKG, normal sinus rhythm, rate 59, PR interval 168, QRS interval 103, QTc 480, unchanged from 7/30  Assessment & Plan by Problem: Letta KocherDeborah Carawan is a 79 year old female with a PMH of COPD, HTN, Hyperlipidemia, Peripheral vascular disease, Lymphocytosis and hyponatremia who [facility] with hyponatremia after a fall and potential seizure at home.   1. Syncope: Conflicting reports from Ms. Askill v. Her family. Ms Rolm BaptiseMcAskill states this was a mechanical fall while ED notes suggest that she syncopized and family observed loss of consciousness with muscle jerking and incontinence. CT head and c spine was not concerning for bleed or fracture. EKG was normal and troponin was negative. An orthostatic component may be present as her orthostatic blood pressures and pulses were:159/68 lying with pulse 62, 147/83 sitting with pulse 64, 103/64 standing with pulse 68. Low sodium of 116 may be a component as well given seizure like muscle contractions, incontinence and  confusion.  -consult neuro  -check TSH, UA, repeat CBC, CMP in am  -cardiac monitoring  -PT/OT as fall risk   2. Hyponatremia: Sodium of 116 and a history of hyponatremia. Hard to determine  volume status- mucous membranes moist but extremely dry skin in addition to orthostatic hypotension. On previous admission Na was down to 124 and hyponatremia is believe to be chronic in nature due to poor PO intake. Today pt revealed that she drinks ~6 beers per Mattis Featherly which is concerning for beer potomania. This may explain her seizure, and causes concern that she is symptomatic.  -Increase Na no more than 6-8 mEq per Alainna Stawicki -Renal consulted -NS IVF 125 mL/hr until renal recs  -monitor w BMET q4h  -serum osmolality to check hydration status  -urine osmolality  -urine sodium  -urine creatinine  -urine urea nitrogen  -CIWA protocol  -seizure precautions   3. Lymphocyte predominant lymphocytosis: WBC 16.7 with 57% lymphocytes on admission. Also noticed on previous admission in 04/2014 (Wbc- 20.1, lymphocytes 54%). Previously seen by Dr. Gaylyn Rong in 04/2012 who though it was due to recurrent UTIs. Concerning for malignancy (lymphoma) or infection (EBV) -Peripheral blood smear will give more insight into morphology of the lymphocytes.   4.  Anemia: Hemoglobin 10.2 was 10.7 on discharge. Hemodynamically stable. Could be B12/Folate deficiency (is normocytic before it becomes macrocytic) which the smear will give [place] insight into. It could also be iron deficiency from poor nutrition. -FOBC  -anemia panel  -peripheral blood smear -Daily CBCs to trend Hbs  4. COPD: No PFTs in EMR. Uses 2L O2 on [place] when needed at home (mostly at night). Also takes 2.5 mcg puff daily and albuterol 1 puff q6hprn. Smoke for 30+ years but quit 4 years ago. Patient reports dry chronic cough that is unchanged. Lungs CTAB and oxygen sat 97-100% on 2L [place].  -cont tiotropium 2.41mcg inh daily and albuterol 1 puff q6hprn  -O2 via [place] as required for O2  sat > 92%  5. HTN: Ms. MsAskill's BP was 130s-170s on presentation. She currently prescribed losartan 100 mg- HCTZ 12.5 mg daily and labetalol 200 mg BID but only taking labetalol once at home.  -hold HTN meds until BMP normalizes   6. Chronic Back Pain: Chronic. Degenerative disc disease on CT spine and hand, knee aches described in history are most likely to be arthritis related pain.  -Acetomnophen 65  7. Diet: NPO until passes swallow study  8. PPx: heparin 5000 u Scott tid, SCDs  9. Dispo:  Disposition is deferred at this time, awaiting improvement of current medical problems. Anticipated discharge in approximately 2 Mera Gunkel(s).    This is a Psychologist, occupational Note.  The care of the patient was discussed with Dr. Koren Bound and the assessment and plan was formulated with their assistance.  Please see their note for official documentation of the patient encounter.   Signed: Hennie Duos Blaike Vickers, Med Student 08/07/2014, 4:24 PM

## 2014-08-07 NOTE — ED Provider Notes (Signed)
CSN: [id number]     Arrival date & time 08/07/14  1202 History   First MD Initiated Contact with Patient 08/07/14 1203     No chief complaint on file.    (Consider location/radiation/quality/duration/timing/severity/associated sxs/prior Treatment) [name] presenting with syncope. The history is provided by the patient and the EMS personnel. No language interpreter was used.  Loss of Consciousness Episode history:  Single Most recent episode:  Today Timing:  Constant Progression:  Worsening Chronicity:  New Context: standing up   Witnessed: yes   Relieved by:  Nothing Worsened by:  Nothing tried Ineffective treatments:  None tried Associated symptoms: recent fall (today)   Associated symptoms: no chest pain, no diaphoresis, no fever, no focal sensory loss, no headaches, no nausea, no recent injury, no shortness of breath, no vomiting and no weakness   Risk factors: no seizures     Past Medical History  Diagnosis Date  . COPD (chronic obstructive pulmonary disease)   . HTN (hypertension)   . Hyperlipidemia   . Peripheral vascular disease   . Lymphocytosis   . Hyponatremia   . Recurrent UTI    Past Surgical History  Procedure Laterality Date  . Abdominal hysterectomy      for fibroid  . Tonsilectomy, adenoidectomy, bilateral myringotomy and tubes     Family History  Problem Relation Age of Onset  . Hypertension Father   . Heart attack Father   . Heart attack Sister    History  Substance Use Topics  . Smoking status: Current Every Day Smoker -- 2.00 packs/day for 50 years  . Smokeless tobacco: Not on file  . Alcohol Use: No   OB History   Grav Para Term Preterm Abortions TAB SAB Ect Mult Living                 Review of Systems  Constitutional: Negative for fever and diaphoresis.  HENT: Negative for congestion, rhinorrhea and sore throat.   Respiratory: Negative for cough and shortness of breath.   Cardiovascular: Positive for syncope.  Negative for chest pain.  Gastrointestinal: Negative for nausea, vomiting, abdominal pain and diarrhea.  Genitourinary: Negative for dysuria and hematuria.  Skin: Negative for rash.  Neurological: Positive for syncope. Negative for weakness, light-headedness and headaches.  All other systems reviewed and are negative.     Allergies  Review of patient's allergies indicates no known allergies.  Home Medications   Prior to Admission medications   Medication Sig Start Date End Date Taking? Authorizing Provider  amLODipine (NORVASC) 2.5 MG tablet Take 1 tablet (2.5 mg total) by mouth daily. 05/15/14   Rich Numberarly Rivet, MD  aspirin 81 MG tablet Take 81 mg by mouth daily.    Historical Provider, MD  donepezil (ARICEPT) 5 MG tablet Take 5 mg by mouth daily. 05/06/14   Historical Provider, MD  labetalol (NORMODYNE) 200 MG tablet Take 200 mg by mouth 2 (two) times daily. 04/29/14   Historical Provider, MD  losartan (COZAAR) 100 MG tablet Take 1 tablet (100 mg total) by mouth daily. 05/15/14   Rich Numberarly Rivet, MD  Methylfol-Methylcob-Acetylcyst (CEREFOLIN NAC PO) Take 1 tablet by mouth daily.    Historical Provider, MD  PROAIR HFA 108 (90 BASE) MCG/ACT inhaler Inhale 1 puff into the lungs every 6 (six) hours as needed. 04/29/14   Historical Provider, MD  triamcinolone cream (KENALOG) 0.1 % Apply 1 application topically 2 (two) times daily. 05/13/14   Historical Provider, MD   BP 149/58  Pulse 58  Temp(Src) 98.5 F (36.9 C) (Oral)  Resp 14  SpO2 100% Physical Exam  ED Course  Procedures (including critical care time) Labs Review Labs Reviewed  CBC WITH DIFFERENTIAL - Abnormal; Notable for the following:    WBC 16.7 (*)    RBC 3.29 (*)    Hemoglobin 10.2 (*)    HCT 28.6 (*)    All other components within normal limits  PROTIME-INR - Abnormal; Notable for the following:    Prothrombin Time 15.6 (*)    All other components within normal limits  CBG MONITORING, ED - Abnormal; Notable for the  following:    Glucose-Capillary 116 (*)    All other components within normal limits  TROPONIN I  COMPREHENSIVE METABOLIC PANEL  URINALYSIS, ROUTINE W REFLEX MICROSCOPIC  POCT CBG (FASTING - GLUCOSE)-MANUAL ENTRY    Imaging Review Ct Head Wo Contrast  08/07/2014   CLINICAL DATA:  Loss of consciousness. Fell in kitchen and hit floor.  EXAM: CT HEAD WITHOUT CONTRAST  CT CERVICAL SPINE WITHOUT CONTRAST  TECHNIQUE: Multidetector CT imaging of the head and cervical spine was performed following the standard protocol without intravenous contrast. Multiplanar CT image reconstructions of the cervical spine were also generated.  COMPARISON:  CT scan of head of May 14, 2014.  FINDINGS: CT HEAD FINDINGS  Bony calvarium appears intact. Minimal diffuse cortical atrophy is noted. Mild chronic ischemic white matter disease is noted. No mass effect or midline shift is noted. Ventricular size is within normal limits. There is no evidence of mass lesion, hemorrhage or acute infarction.  CT CERVICAL SPINE FINDINGS  No fracture or spondylolisthesis is noted. Mild degenerative disc disease is noted at C4-5. Severe degenerative disc disease is noted at C5-6 and C6-7 with posterior osteophyte formation. Minimal hypertrophy of posterior facet joints is noted at multiple levels.  IMPRESSION: Minimal diffuse cortical atrophy. Mild chronic ischemic white matter disease. No acute intracranial abnormality seen.  Multilevel degenerative disc disease is noted. No acute abnormality seen in the cervical spine.   Electronically Signed   By: Roque Lias M.D.   On: 08/07/2014 13:18   Ct Cervical Spine Wo Contrast  08/07/2014   CLINICAL DATA:  Loss of consciousness. Fell in kitchen and hit floor.  EXAM: CT HEAD WITHOUT CONTRAST  CT CERVICAL SPINE WITHOUT CONTRAST  TECHNIQUE: Multidetector CT imaging of the head and cervical spine was performed following the standard protocol without intravenous contrast. Multiplanar CT image  reconstructions of the cervical spine were also generated.  COMPARISON:  CT scan of head of May 14, 2014.  FINDINGS: CT HEAD FINDINGS  Bony calvarium appears intact. Minimal diffuse cortical atrophy is noted. Mild chronic ischemic white matter disease is noted. No mass effect or midline shift is noted. Ventricular size is within normal limits. There is no evidence of mass lesion, hemorrhage or acute infarction.  CT CERVICAL SPINE FINDINGS  No fracture or spondylolisthesis is noted. Mild degenerative disc disease is noted at C4-5. Severe degenerative disc disease is noted at C5-6 and C6-7 with posterior osteophyte formation. Minimal hypertrophy of posterior facet joints is noted at multiple levels.  IMPRESSION: Minimal diffuse cortical atrophy. Mild chronic ischemic white matter disease. No acute intracranial abnormality seen.  Multilevel degenerative disc disease is noted. No acute abnormality seen in the cervical spine.   Electronically Signed   By: Roque Lias M.D.   On: 08/07/2014 13:18   Dg Chest Port 1 View  08/07/2014   CLINICAL DATA:  Patient experienced  an episode of loss of consciousness today and fell in kitchen. Patient has a history of COPD and hypertension. Ex smoker.  EXAM: PORTABLE CHEST - 1 VIEW  COMPARISON:  05/14/2014.  FINDINGS: Cardiac silhouette is mildly enlarged. The aorta is tortuous. No mediastinal or hilar masses or convincing adenopathy.  Lungs are clear.  No pleural effusion or pneumothorax.  Bony thorax is demineralized. There are old left-sided healed rib fractures, stable.  IMPRESSION: No acute cardiopulmonary disease.   Electronically Signed   By: Amie Portlandavid  Ormond M.D.   On: 08/07/2014 12:54     EKG Interpretation   Date/Time:  Friday August 07 2014 12:05:22 EDT Ventricular Rate:  59 PR Interval:  168 QRS Duration: 103 QT Interval:  485 QTC Calculation: 480 R Axis:   -16 Text Interpretation:  Age not entered, assumed to be  79 years old for  purpose of ECG  interpretation Sinus rhythm Left atrial enlargement  Borderline left axis deviation Abnormal R-wave progression, early  transition Nonspecific T abnormalities, anterior leads Borderline  prolonged QT interval Baseline wander in lead(s) V3 Abnormal ekg Confirmed  by BEATON  MD, ROBERT ([id number]) on 08/07/2014 12:30:15 PM      MDM   Final diagnoses:  Hyponatremia  Syncope and collapse    12:04 PM Pt is a 79 y.o. female with pertinent PMHX of COPd on home oxygen, HTN, HLD, PVD who presents to the ED with syncopal episode unprovoked without prodrome. Was walking to kitchen when patient passed out and hit head. Post syncope patient had some generalized shaking and incontinence. No post ictal state. Patient with amnesia to event. No chest pain or shortness of breath. No recent nausea, vomiting or diarrhea. No fevers. . No increase in use of oxygen. Baseline non productive cough. No dysuria or hematuria. Endorses thick speech. Denies any focal neurologic deficits  On exam: non focal neurologic exam: no abdominal tenderness. Normal coordination. gait deferred. Concern for syncopal episode without prodrome for possible cardiac versus neruologic versus infectious etiology. Plan for EKG. CBC, CMP troponin, EKG, CXR. [facility] head wo contrast and Ct cervical spine wo contrast. Will also obtain UA  EKG personally reviewed by myself showed NSR, poor r-wave progression, borderline prolonged QT Rate of 59, PR 168ms, QRS 103ms QT/QTC 485/46889ms, left axis, without evidence of new ischemia. Comparison showed sinus bradycardia with left axis deviation, indication: syncope  CXR ap portable per my read for syncope showed mild cardiomegaly. No focal consolidation  Review of labs: CBC: leukocytosis, H&H 10.2/28.6 INR: 1.23 CMP: hyponatremia: 116 no seizures no AMS, started on infusion of NS Troponin: <0.30  CT head wo contrast: no intracranial abnormality  CT cervical  spine wo contrast showed no acute fracture or dislocation  Plan to consult teaching service for admission for hyponatremia and syncope without prodrome. No seizures. No AMS. Fluids started.   Labs, EKG and imaging reviewed by myself and considered in medical decision making if ordered.  Imaging interpreted by radiology. Pt was discussed with my attending, Dr. Doristine MangoBeaton     Ashlyne Olenick Peter Mckinnley Cottier, MD 08/07/14 [phone]

## 2014-08-07 NOTE — H&P (Signed)
Date: 08/07/2014               Patient Name:  Madeline Jimenez MRN: [id number]  DOB: 10/09/1935 Age / Sex: 79 y.o., female   PCP: Marin Commentheryl Wells, FNP         Medical Service: Internal Medicine Teaching Service         Attending Physician: Dr. Inez CatalinaEmily B Mullen, MD    First Contact: Dr. Farley LyAdam Ramey Schiff Pager: [phone]  Second Contact: Dr. Evelena PeatAlex Wilson Pager: [phone]       After Hours (After 5p/  First Contact Pager: [phone]  weekends / holidays): Second Contact Pager: [phone]   Chief Complaint: loss of consciousness  History of Present Illness: Madeline Jimenez is a 79 year old woman with COPD, HTN, NL, PVD who presents with fall. The following history was obtained from the patient. She was in her usual state of health this morning when she got up to fill a water bottle in her kitchen. She said that she lost her balance and fell and hit the stove. She says she was awake the entire time with no prodromal symptoms (lightheadedness, dizziness, tunnel vision, feeling warm, etc). She denies losing continence of her bowel or bladder. She lives at home with her niece and her husband who called EMS who brought her here. She has not been sick recently. Her only pain is chronic back pain. She notes nocturia but denies dysuria. She has a chronic dry cough. She denies fever, chills, night sweats, diarrhea, constipation, shortness of breath, numbness, paresthesias. The patient had not eaten anything today. She had not drank today but says she normally drinks ~6 beers/day with laying of water.  I spoke to Niece Madeline Jimenez [phone]) on the phone. She told me that Madeline Jimenez was in her usual state of health this morning when she went to get a glass of water and became dizzy and fell in the kitchen. The niece and her husband then helped her up and in to a chair. The patient then kept dropping alleve when it was handed to her. The patient then lost consciousness while sitting in the chair. While unconscious, her hands were  slowly clenching into fists. She urinated and also had a bowel movement. She was unconscious for about one minute and confused when she woke up.  Patient was recently admitted by teaching service in 04/2014 for hyponatremia of 124 in setting of heat exhaustion.  In the ED, she began NS IVF infusion at 125 mL/hr.  Meds: Current Facility-Administered Medications  Medication Dose Route Frequency Provider Last Rate Last Dose  . 0.9 %  sodium chloride infusion   Intravenous Continuous Fredirick LatheAndrew Peter Wong, MD 125 mL/hr at 08/07/14 1347     Current Outpatient Prescriptions  Medication Sig Dispense Refill  . losartan (COZAAR) 100 MG tablet Take 100 mg by mouth every morning.      Marland Kitchen. losartan-hydrochlorothiazide (HYZAAR) 100-12.5 MG per tablet Take 1 tablet by mouth at bedtime.      Marland Kitchen. PROAIR HFA 108 (90 BASE) MCG/ACT inhaler Inhale 1 puff into the lungs every 6 (six) hours as needed.      . Tiotropium Bromide Monohydrate (SPIRIVA RESPIMAT) 2.5 MCG/ACT AERS Inhale 2.5 mcg into the lungs daily.      Marland Kitchen. triamcinolone cream (KENALOG) 0.1 % Apply 1 application topically 2 (two) times daily as needed (for eczema).         Allergies: Allergies as of 08/07/2014  . (No Known Allergies)   Past Medical  History  Diagnosis Date  . COPD (chronic obstructive pulmonary disease)   . HTN (hypertension)   . Hyperlipidemia   . Peripheral vascular disease   . Lymphocytosis   . Hyponatremia   . Recurrent UTI    Past Surgical History  Procedure Laterality Date  . Abdominal hysterectomy      for fibroid  . Tonsilectomy, adenoidectomy, bilateral myringotomy and tubes     Family History  Problem Relation Age of Onset  . Hypertension Father   . Heart attack Father   . Heart attack Sister    History   Social History  . Marital Status: Widowed    Spouse Name: N/A    Number of Children: 1  . Years of Education: N/A   Occupational History  .      retired Advertising copywriter   Social History Main Topics  .  Smoking status: Current Every Day Smoker -- 2.00 packs/day for 50 years  . Smokeless tobacco: Not on file  . Alcohol Use: No  . Drug Use: No  . Sexual Activity:    Other Topics Concern  . Not on file   Social History Narrative  . No narrative on file    Review of Systems: A comprehensive review of systems was negative except for: as noted above in HPI  Physical Exam: Blood pressure 176/64, pulse 61, temperature 98.3 F (36.8 C), temperature source Oral, resp. rate 16, SpO2 100.00%.  Gen: A&O x 4, elderly and disheveled, no acute distress HEENT: Atraumatic, PERRL, EOMI, both eyes slightly injected with clear exudation, moist mucous membranes Heart: Regular rate and rhythm, normal S1 S2, no murmurs, rubs, or gallops Lungs: Clear to auscultation bilaterally, respirations unlabored Abd: Soft, non-tender, non-distended, + bowel sounds, no hepatosplenomegaly Ext: No edema or cyanosis, 2+ peripheral pulses, no skin tenting Neuro: CN II-XII intact, strength 5/5 and symmetric in upper and lower extremities, no Babinkski sign   Lab results: Basic Metabolic Panel:  Recent Labs  91/47/82 1239  NA 116*  K 4.5  CL 77*  CO2 28  GLUCOSE 101*  BUN 13  CREATININE 0.92  CALCIUM 9.3   Liver Function Tests:  Recent Labs  08/07/14 1239  AST 29  ALT 19  ALKPHOS 66  BILITOT 0.7  PROT 6.2  ALBUMIN 3.8   CBC:  Recent Labs  08/07/14 1239  WBC 16.7*  NEUTROABS 6.3  HGB 10.2*  HCT 28.6*  MCV 86.9  PLT 209   Cardiac Enzymes:  Recent Labs  08/07/14 1239  TROPONINI <0.30  CBG:  Recent Labs  08/07/14 1321  GLUCAP 116*   Coagulation:  Recent Labs  08/07/14 1239  LABPROT 15.6*  INR 1.23    Imaging results:  Ct Head Wo Contrast  08/07/2014   CLINICAL DATA:  Loss of consciousness. Fell in kitchen and hit floor.  EXAM: CT HEAD WITHOUT CONTRAST  CT CERVICAL SPINE WITHOUT CONTRAST  TECHNIQUE: Multidetector CT imaging of the head and cervical spine was performed  following the standard protocol without intravenous contrast. Multiplanar CT image reconstructions of the cervical spine were also generated.  COMPARISON:  CT scan of head of May 14, 2014.  FINDINGS: CT HEAD FINDINGS  Bony calvarium appears intact. Minimal diffuse cortical atrophy is noted. Mild chronic ischemic white matter disease is noted. No mass effect or midline shift is noted. Ventricular size is within normal limits. There is no evidence of mass lesion, hemorrhage or acute infarction.  CT CERVICAL SPINE FINDINGS  No fracture or spondylolisthesis is  noted. Mild degenerative disc disease is noted at C4-5. Severe degenerative disc disease is noted at C5-6 and C6-7 with posterior osteophyte formation. Minimal hypertrophy of posterior facet joints is noted at multiple levels.  IMPRESSION: Minimal diffuse cortical atrophy. Mild chronic ischemic white matter disease. No acute intracranial abnormality seen.  Multilevel degenerative disc disease is noted. No acute abnormality seen in the cervical spine.   Electronically Signed   By: Roque Lias M.D.   On: 08/07/2014 13:18   Ct Cervical Spine Wo Contrast  08/07/2014   CLINICAL DATA:  Loss of consciousness. Fell in kitchen and hit floor.  EXAM: CT HEAD WITHOUT CONTRAST  CT CERVICAL SPINE WITHOUT CONTRAST  TECHNIQUE: Multidetector CT imaging of the head and cervical spine was performed following the standard protocol without intravenous contrast. Multiplanar CT image reconstructions of the cervical spine were also generated.  COMPARISON:  CT scan of head of May 14, 2014.  FINDINGS: CT HEAD FINDINGS  Bony calvarium appears intact. Minimal diffuse cortical atrophy is noted. Mild chronic ischemic white matter disease is noted. No mass effect or midline shift is noted. Ventricular size is within normal limits. There is no evidence of mass lesion, hemorrhage or acute infarction.  CT CERVICAL SPINE FINDINGS  No fracture or spondylolisthesis is noted. Mild degenerative  disc disease is noted at C4-5. Severe degenerative disc disease is noted at C5-6 and C6-7 with posterior osteophyte formation. Minimal hypertrophy of posterior facet joints is noted at multiple levels.  IMPRESSION: Minimal diffuse cortical atrophy. Mild chronic ischemic white matter disease. No acute intracranial abnormality seen.  Multilevel degenerative disc disease is noted. No acute abnormality seen in the cervical spine.   Electronically Signed   By: Roque Lias M.D.   On: 08/07/2014 13:18   Dg Chest Port 1 View  08/07/2014   CLINICAL DATA:  Patient experienced an episode of loss of consciousness today and fell in kitchen. Patient has a history of COPD and hypertension. Ex smoker.  EXAM: PORTABLE CHEST - 1 VIEW  COMPARISON:  05/14/2014.  FINDINGS: Cardiac silhouette is mildly enlarged. The aorta is tortuous. No mediastinal or hilar masses or convincing adenopathy.  Lungs are clear.  No pleural effusion or pneumothorax.  Bony thorax is demineralized. There are old left-sided healed rib fractures, stable.  IMPRESSION: No acute cardiopulmonary disease.   Electronically Signed   By: Amie Portland M.D.   On: 08/07/2014 12:54    Other results: EKG: normal EKG, normal sinus rhythm, rate 59, PR interval 168, QRS interval 103, QTc 480, unchanged from 7/30  Assessment & Plan by Problem: Active Problems:   Hyponatremia   #Syncope v Seizure: Conflicting of reports from patient v EMS. Madeline Jimenez states this was a mechanical fall while ED notes suggest that she syncopized and family observed loss of consciousness with muscle jerking and incontinence. CT head and c spine no acute abnormality noted. EKG unchanged and troponin negative so cardiac etiology unlikely. She had her orthostatics taken which were 159/68 lying with pulse 62, 147/83 sitting with pulse 64, 103/64 standing with pulse 68. A component may be orthostatic. In addition, sodium 116 and this may be symptomatic hyponatremia if she had a seizure.  Per niece's history, it sounds like the patient may have had a seizure given clenching, incontinence, and confusion. This may have been 2/2 hyponatremia. -consult neuro -check TSH, UA, repeat CBC, CMP in am -cardiac monitoring -PT/OT as fall risk  #Euvolemic Hyponatremia: Sodium 116 may be responsible for seizure/syncope v incidental.  Patient has history of hyponatremia. She appeared euvolemic on exam. On previous admission Na 124 and believed to be chronic in nature and thought to be due to poor po intake. History today concerning for beer potomania as patient reports ~6 beers/day. Concern that she is symptomatic as she may have had a seizure. We will attempt to increase Na no more than 6-8 mEq/day -consult renal -NS IVF 125 mL/hr until renal recs -monitor w BMET q4h -serum osmolality -urine osmolality -urine sodium -urine creatinine -urine urea nitrogen -CIWA protocol -seizure precautions  #Lymphocyte Predominant Leukocytosis: WBC 16.7 with 57% lymphocytes on admission. Patient is afebrile Noted on previous admission in 04/2014 when she had WBC 20.1 w predominant lymphocytes 54%. She was previously seen by Dr Gaylyn RongHa in 04/2012 who thought this was 2/2 recurrent UTIs. She has not been evaluated by hematology-oncology. No signs of infection as afebrile and ROS not concerning -peripheral blood smear  #Anemia: Hemoglobin 10.2 was 10.7 on discharge. Hemodynamically stable. -FOBC -anemia panel -peripheral blood smear  #COPD: I could not find PFTs in the patient's EMR. Madeline Jimenez uses 2 L O2 [place] when needed at home and currently takes at home tiotropium 2.5 mcg puff daily and albuterol 1 puff q6hprn. Patient reports dry chronic cough that is unchanged. Lungs CTAB and oxygen sat 97-100% on 2L [place]. -cont tiotropium 2.525mcg inh daily and albuterol 1 puff q6hprn  -O2 via [place] as required for O2 sat > 92%  #HTN: Madeline Rosero's BP was 130s-170s/50s-60s on presentation. She currently prescribed losartan 100  mg- HCTZ 12.5 mg daily and labetalol 200 mg BID but only taking labetalol once at home.  -will hold for now and reassess  Chronic Back Pain: Chronic issue. Degenerative disc disease on CT c spin so likely osteoarthritic throughout. -acetaminophen 650 mg po q6hprn  #Diet: NPO  #VTE PPx: heparin 5000 u [place] tid, SCDs  Dispo: Disposition is deferred at this time, awaiting improvement of current medical problems. Anticipated discharge in approximately 2 day(s).   The patient does have a current PCP Marin Comment(Cheryl Wells, FNP) and [facility] after discharge.  The patient does not know have transportation limitations that hinder transportation to clinic appointments.  Signed: Lorenda HatchetAdam L Arion Morgan, MD 08/07/2014, 4:44 PM

## 2014-08-07 NOTE — Consult Note (Signed)
NEURO HOSPITALIST CONSULT NOTE    Reason for Consult: seizure  HPI:                                                                                                                                          Madeline Jimenez is an 79 y.o. female with a past medical history significant for HTN, hyperlipidemia, COPD, PVD, and chronic hyponatremia, brought in after falling andsustaining a transient episode of LOC at home. She recalls standing up in the kitchen trying to fill a water bottle then she said that she lost her balance and fell and hit the stove but is unsure if he was unconscious. Family is not available but review of hospital notes indicate that " she was in her usual state of health this morning when she went to get a glass of water and became dizzy and fell in the kitchen. The niece and her husband then helped her up and in to a chair. The patient then kept dropping alleve when it was handed to her. The patient then lost consciousness while sitting in the chair. While unconscious, her hands were slowly clenching into fists. She urinated and also had a bowel movement. She was unconscious for about one minute and confused when she woke up.". Work up in he ED revealed Na 116 and unremarkable CT brain. At this moment, she denies HA, vertigo, double vision, difficulty swallowing, focal weakness, slurred speech, language or vision impairment. No recent fever, infection, alcohol intake, or use of illicit drugs. No history of CNS infections, stroke, or severe head trauma. No family history of epilepsy. Neurology was asked to see patient and make further recommendations.  Past Medical History  Diagnosis Date  . COPD (chronic obstructive pulmonary disease)   . HTN (hypertension)   . Hyperlipidemia   . Peripheral vascular disease   . Lymphocytosis   . Hyponatremia   . Recurrent UTI     Past Surgical History  Procedure Laterality Date  . Abdominal hysterectomy      for  fibroid  . Tonsilectomy, adenoidectomy, bilateral myringotomy and tubes      Family History  Problem Relation Age of Onset  . Hypertension Father   . Heart attack Father   . Heart attack Sister      Social History:  reports that she has been smoking.  She does not have any smokeless tobacco history on file. She reports that she does not drink alcohol or use illicit drugs.  No Known Allergies  MEDICATIONS:  Scheduled: . folic acid  1 mg Oral Daily  . multivitamin with minerals  1 tablet Oral Daily  . sodium chloride  2 g Oral TID WC  . thiamine  100 mg Oral Daily   Or  . thiamine  100 mg Intravenous Daily     ROS:                                                                                                                                       History obtained from the patient and chart review.  General ROS: negative for - chills, fever, night sweats, or weight loss Psychological ROS: negative for - behavioral disorder, hallucinations, memory difficulties, mood swings or suicidal ideation Ophthalmic ROS: negative for - blurry vision, double vision, eye pain or loss of vision ENT ROS: negative for - epistaxis, nasal discharge, oral lesions, sore throat, tinnitus or vertigo Allergy and Immunology ROS: negative for - hives or itchy/watery eyes Hematological and Lymphatic ROS: negative for - bleeding problems, bruising or swollen lymph nodes Endocrine ROS: negative for - galactorrhea, hair pattern changes, polydipsia/polyuria or temperature intolerance Respiratory ROS: negative for - cough, hemoptysis, shortness of breath or wheezing Cardiovascular ROS: negative for - chest pain, dyspnea on exertion, edema or irregular heartbeat Gastrointestinal ROS: negative for - abdominal pain, diarrhea, hematemesis, nausea/vomiting or stool incontinence Genito-Urinary ROS:  negative for - dysuria, hematuria, incontinence or urinary frequency/urgency Musculoskeletal ROS: negative for - joint swelling or muscular weakness Neurological ROS: as noted in HPI Dermatological ROS: negative for rash and skin lesion changes   Physical exam: pleasant female in no apparent distress.  Blood pressure 144/66, pulse 64, temperature 98.3 F (36.8 C), temperature source Oral, resp. rate 15, SpO2 100.00%. Head: normocephalic. Neck: supple, no bruits, no JVD. Cardiac: no murmurs. Lungs: clear. Abdomen: soft, no tender, no mass. Extremities: no edema. Neurologic Examination:                                                                                                      General: Mental Status: Alert, oriented x 4, thought content appropriate.  Speech fluent without evidence of aphasia.  Able to follow 3 step commands without difficulty. Cranial Nerves: II: Discs flat bilaterally; Visual fields grossly normal, pupils equal, round, reactive to light and accommodation III,IV, VI: ptosis not present, extra-ocular motions intact bilaterally V,VII: smile symmetric, facial light touch sensation normal bilaterally VIII: hearing normal bilaterally IX,X: gag reflex present XI: bilateral shoulder shrug XII: midline tongue extension without  atrophy or fasciculations Motor: Moves all limbs symmetrically. Tone and bulk:normal tone throughout; no atrophy noted Sensory: Pinprick and light touch intact throughout, bilaterally Deep Tendon Reflexes:  1+ all over Cerebellar: normal finger-to-nose,  normal heel-to-shin test Gait:  No tested due to safety reasons CV: pulses palpable throughout    No results found for this basename: cbc, bmp, coags, chol, tri, ldl, hga1c    Results for orders placed during the hospital encounter of 08/07/14 (from the past 48 hour(s))  CBC WITH DIFFERENTIAL     Status: Abnormal   Collection Time    08/07/14 12:39 PM      Result Value Ref Range    WBC 16.7 (*) 4.0 - 10.5 K/uL   RBC 3.29 (*) 3.87 - 5.11 MIL/uL   Hemoglobin 10.2 (*) 12.0 - 15.0 g/dL   HCT 28.6 (*) 36.0 - 46.0 %   MCV 86.9  78.0 - 100.0 fL   MCH 31.0  26.0 - 34.0 pg   MCHC 35.7  30.0 - 36.0 g/dL   RDW 12.6  11.5 - 15.5 %   Platelets 209  150 - 400 K/uL   Neutrophils Relative % 38 (*) 43 - 77 %   Lymphocytes Relative 57 (*) 12 - 46 %   Monocytes Relative 4  3 - 12 %   Eosinophils Relative 1  0 - 5 %   Basophils Relative 0  0 - 1 %   Neutro Abs 6.3  1.7 - 7.7 K/uL   Lymphs Abs 9.5 (*) 0.7 - 4.0 K/uL   Monocytes Absolute 0.7  0.1 - 1.0 K/uL   Eosinophils Absolute 0.2  0.0 - 0.7 K/uL   Basophils Absolute 0.0  0.0 - 0.1 K/uL   WBC Morphology ABSOLUTE LYMPHOCYTOSIS    COMPREHENSIVE METABOLIC PANEL     Status: Abnormal   Collection Time    08/07/14 12:39 PM      Result Value Ref Range   Sodium 116 (*) 137 - 147 mEq/L   Comment: CRITICAL RESULT CALLED TO, READ BACK BY AND VERIFIED WITH:     BAST T RN 08/07/14 1328 COSTELLO B   Potassium 4.5  3.7 - 5.3 mEq/L   Chloride 77 (*) 96 - 112 mEq/L   CO2 28  19 - 32 mEq/L   Glucose, Bld 101 (*) 70 - 99 mg/dL   BUN 13  6 - 23 mg/dL   Creatinine, Ser 0.92  0.50 - 1.10 mg/dL   Calcium 9.3  8.4 - 10.5 mg/dL   Total Protein 6.2  6.0 - 8.3 g/dL   Albumin 3.8  3.5 - 5.2 g/dL   AST 29  0 - 37 U/L   ALT 19  0 - 35 U/L   Alkaline Phosphatase 66  39 - 117 U/L   Total Bilirubin 0.7  0.3 - 1.2 mg/dL   GFR calc non Af Amer 59 (*) >90 mL/min   GFR calc Af Amer 68 (*) >90 mL/min   Comment: (NOTE)     The eGFR has been calculated using the CKD EPI equation.     This calculation has not been validated in all clinical situations.     eGFR's persistently <90 mL/min signify possible Chronic Kidney     Disease.   Anion gap 11  5 - 15  PROTIME-INR     Status: Abnormal   Collection Time    08/07/14 12:39 PM      Result Value Ref Range   Prothrombin Time 15.6 (*)  11.6 - 15.2 seconds   INR 1.23  0.00 - 1.49  TROPONIN I     Status:  None   Collection Time    08/07/14 12:39 PM      Result Value Ref Range   Troponin I <0.30  <0.30 ng/mL   Comment:            Due to the release kinetics of cTnI,     a negative result within the first hours     of the onset of symptoms does not rule out     myocardial infarction with certainty.     If myocardial infarction is still suspected,     repeat the test at appropriate intervals.  CBG MONITORING, ED     Status: Abnormal   Collection Time    08/07/14  1:21 PM      Result Value Ref Range   Glucose-Capillary 116 (*) 70 - 99 mg/dL    Ct Head Wo Contrast  08/07/2014   CLINICAL DATA:  Loss of consciousness. Fell in kitchen and hit floor.  EXAM: CT HEAD WITHOUT CONTRAST  CT CERVICAL SPINE WITHOUT CONTRAST  TECHNIQUE: Multidetector CT imaging of the head and cervical spine was performed following the standard protocol without intravenous contrast. Multiplanar CT image reconstructions of the cervical spine were also generated.  COMPARISON:  CT scan of head of May 14, 2014.  FINDINGS: CT HEAD FINDINGS  Bony calvarium appears intact. Minimal diffuse cortical atrophy is noted. Mild chronic ischemic white matter disease is noted. No mass effect or midline shift is noted. Ventricular size is within normal limits. There is no evidence of mass lesion, hemorrhage or acute infarction.  CT CERVICAL SPINE FINDINGS  No fracture or spondylolisthesis is noted. Mild degenerative disc disease is noted at C4-5. Severe degenerative disc disease is noted at C5-6 and C6-7 with posterior osteophyte formation. Minimal hypertrophy of posterior facet joints is noted at multiple levels.  IMPRESSION: Minimal diffuse cortical atrophy. Mild chronic ischemic white matter disease. No acute intracranial abnormality seen.  Multilevel degenerative disc disease is noted. No acute abnormality seen in the cervical spine.   Electronically Signed   By: Sabino Dick M.D.   On: 08/07/2014 13:18   Ct Cervical Spine Wo  Contrast  08/07/2014   CLINICAL DATA:  Loss of consciousness. Fell in kitchen and hit floor.  EXAM: CT HEAD WITHOUT CONTRAST  CT CERVICAL SPINE WITHOUT CONTRAST  TECHNIQUE: Multidetector CT imaging of the head and cervical spine was performed following the standard protocol without intravenous contrast. Multiplanar CT image reconstructions of the cervical spine were also generated.  COMPARISON:  CT scan of head of May 14, 2014.  FINDINGS: CT HEAD FINDINGS  Bony calvarium appears intact. Minimal diffuse cortical atrophy is noted. Mild chronic ischemic white matter disease is noted. No mass effect or midline shift is noted. Ventricular size is within normal limits. There is no evidence of mass lesion, hemorrhage or acute infarction.  CT CERVICAL SPINE FINDINGS  No fracture or spondylolisthesis is noted. Mild degenerative disc disease is noted at C4-5. Severe degenerative disc disease is noted at C5-6 and C6-7 with posterior osteophyte formation. Minimal hypertrophy of posterior facet joints is noted at multiple levels.  IMPRESSION: Minimal diffuse cortical atrophy. Mild chronic ischemic white matter disease. No acute intracranial abnormality seen.  Multilevel degenerative disc disease is noted. No acute abnormality seen in the cervical spine.   Electronically Signed   By: Sabino Dick M.D.   On: 08/07/2014 13:18  Dg Chest Port 1 View  08/07/2014   CLINICAL DATA:  Patient experienced an episode of loss of consciousness today and fell in kitchen. Patient has a history of COPD and hypertension. Ex smoker.  EXAM: PORTABLE CHEST - 1 VIEW  COMPARISON:  05/14/2014.  FINDINGS: Cardiac silhouette is mildly enlarged. The aorta is tortuous. No mediastinal or hilar masses or convincing adenopathy.  Lungs are clear.  No pleural effusion or pneumothorax.  Bony thorax is demineralized. There are old left-sided healed rib fractures, stable.  IMPRESSION: No acute cardiopulmonary disease.   Electronically Signed   By: Lajean Manes M.D.   On: 08/07/2014 12:54   Assessment/Plan: 79 y/o without known risk factors for epilepsy, brought in after sustaining a fall followed by a paroxysmal event with a semiology highly consistent with a generalized seizure. Na 116. CT brain unremarkable for acute abnormality. Patient mental status is back to baseline and haas no lateralizing signs on exam. Most likely provoked GTC seizure in the context of severe hyponatremia. No AED indicated. Will defer further neurological testing unless recurrent seizures. Agree with slow correction of hyponatremia to prevent CPM. Please, call neurology with further questions or concern.    Dorian Pod, MD 08/07/2014, 5:36 PM

## 2014-08-07 NOTE — Consult Note (Signed)
Reason for Consult: Hyponatremia Referring Physician: Dr. Andrey CampanileWilson  HPI: Bynum BellowsDeborah E Madeline Jimenez is an 79 y.o. female with PMH HTN, COPD, HLD, PVD, lymphocytosis, chronic hyponatremia. Admitted 08/07/2014 for syncope and found to have hyponatremia to 116. Nephrology asked to consult regarding this. Pt states she does not think she lost consciousness, but per family she was jerking on the floor and incontinent. CT head and c-spine showed no acute abnormality. She states she currently feels okay. She admits to drinking at least 32oz + 6 beers a day (statse splits 2-3 cases of beer between 3 people, but she only drinks 6).   Trend in Creatinine: Creatinine, Ser  Date/Time Value Ref Range Status  08/07/2014 12:39 PM 0.92  0.50 - 1.10 mg/dL Final  1/61/09607/31/2015  4:546:20 AM 0.77  0.50 - 1.10 mg/dL Final  0/98/11917/30/2015  4:784:28 PM 0.82  0.50 - 1.10 mg/dL Final  2/95/62137/25/2013  0:863:21 PM 0.80  0.50 - 1.10 mg/dL Final    PMH:   Past Medical History  Diagnosis Date  . COPD (chronic obstructive pulmonary disease)   . HTN (hypertension)   . Hyperlipidemia   . Peripheral vascular disease   . Lymphocytosis   . Hyponatremia   . Recurrent UTI     PSH:   Past Surgical History  Procedure Laterality Date  . Abdominal hysterectomy      for fibroid  . Tonsilectomy, adenoidectomy, bilateral myringotomy and tubes      Allergies: No Known Allergies  Medications:   Prior to Admission medications   Medication Sig Start Date End Date Taking? Authorizing Provider  losartan (COZAAR) 100 MG tablet Take 100 mg by mouth every morning.   Yes Historical Provider, MD  losartan-hydrochlorothiazide (HYZAAR) 100-12.5 MG per tablet Take 1 tablet by mouth at bedtime.   Yes Historical Provider, MD  PROAIR HFA 108 (90 BASE) MCG/ACT inhaler Inhale 1 puff into the lungs every 6 (six) hours as needed. 04/29/14  Yes Historical Provider, MD  Tiotropium Bromide Monohydrate (SPIRIVA RESPIMAT) 2.5 MCG/ACT AERS Inhale 2.5 mcg into the lungs daily.    Yes Historical Provider, MD  triamcinolone cream (KENALOG) 0.1 % Apply 1 application topically 2 (two) times daily as needed (for eczema).  05/13/14  Yes Historical Provider, MD    Inpatient medications: . folic acid  1 mg Oral Daily  . multivitamin with minerals  1 tablet Oral Daily  . sodium chloride  2 g Oral TID WC  . thiamine  100 mg Oral Daily   Or  . thiamine  100 mg Intravenous Daily    Discontinued Meds:   Medications Discontinued During This Encounter  Medication Reason  . aspirin 81 MG tablet Side effect (s)  . amLODipine (NORVASC) 2.5 MG tablet Patient has not taken in last 30 days  . Methylfol-Methylcob-Acetylcyst (CEREFOLIN NAC PO) Patient has not taken in last 30 days  . donepezil (ARICEPT) 5 MG tablet Patient has not taken in last 30 days  . labetalol (NORMODYNE) 200 MG tablet Patient has not taken in last 30 days  . losartan (COZAAR) 100 MG tablet Dose change    Social History:  reports that she has been smoking.  She does not have any smokeless tobacco history on file. She reports that she does not drink alcohol or use illicit drugs.  Family History:   Family History  Problem Relation Age of Onset  . Hypertension Father   . Heart attack Father   . Heart attack Sister     A comprehensive review  of systems was negative except for: Genitourinary: positive for frequency Weight change:  No intake or output data in the 24 hours ending 08/07/14 1716 BP 176/64  Pulse 61  Temp(Src) 98.3 F (36.8 C) (Oral)  Resp 16  SpO2 100% Filed Vitals:   08/07/14 1500 08/07/14 1545 08/07/14 1548 08/07/14 1613  BP: 155/50 176/64 176/64   Pulse: 58 58 61   Temp:    98.3 F (36.8 C)  TempSrc:      Resp: 12 13 16    SpO2: 100% 98% 100%      General appearance: alert, cooperative and no distress Head: Normocephalic, without obvious abnormality, atraumatic Eyes: positive findings: conjunctival injection Throat: abnormal findings: MMM, 2 lesions on tongue front left red  bump, right side Neck: no adenopathy, no carotid bruit and no JVD Resp: clear to auscultation bilaterally Cardio: regular rate and rhythm, S1, S2 normal, no murmur, click, rub or gallop GI: soft, non-tender; bowel sounds normal; no masses,  no organomegaly Extremities: extremities normal, atraumatic, no cyanosis or edema Skin: hyperpigmentation of both forearms, scabs/ecchymosis Neurologic: alert, oriented, some difficulty with speech but overall fairly coherent. Finger to nose testing intact. Grossly normal exam.  Labs: Basic Metabolic Panel:  Recent Labs Lab 08/07/14 1239  NA 116*  K 4.5  CL 77*  CO2 28  GLUCOSE 101*  BUN 13  CREATININE 0.92  ALBUMIN 3.8  CALCIUM 9.3   Liver Function Tests:  Recent Labs Lab 08/07/14 1239  AST 29  ALT 19  ALKPHOS 66  BILITOT 0.7  PROT 6.2  ALBUMIN 3.8   No results found for this basename: LIPASE, AMYLASE,  in the last 168 hours No results found for this basename: AMMONIA,  in the last 168 hours CBC:  Recent Labs Lab 08/07/14 1239  WBC 16.7*  NEUTROABS 6.3  HGB 10.2*  HCT 28.6*  MCV 86.9  PLT 209   PT/INR: @LABRCNTIP (inr:5) Cardiac Enzymes: ) Recent Labs Lab 08/07/14 1239  TROPONINI <0.30   CBG:  Recent Labs Lab 08/07/14 1321  GLUCAP 116*    Iron Studies: No results found for this basename: IRON, TIBC, TRANSFERRIN, FERRITIN,  in the last 168 hours  Xrays/Other Studies: Ct Head Wo Contrast  08/07/2014   CLINICAL DATA:  Loss of consciousness. Fell in kitchen and hit floor.  EXAM: CT HEAD WITHOUT CONTRAST  CT CERVICAL SPINE WITHOUT CONTRAST  TECHNIQUE: Multidetector CT imaging of the head and cervical spine was performed following the standard protocol without intravenous contrast. Multiplanar CT image reconstructions of the cervical spine were also generated.  COMPARISON:  CT scan of head of May 14, 2014.  FINDINGS: CT HEAD FINDINGS  Bony calvarium appears intact. Minimal diffuse cortical atrophy is noted. Mild  chronic ischemic white matter disease is noted. No mass effect or midline shift is noted. Ventricular size is within normal limits. There is no evidence of mass lesion, hemorrhage or acute infarction.  CT CERVICAL SPINE FINDINGS  No fracture or spondylolisthesis is noted. Mild degenerative disc disease is noted at C4-5. Severe degenerative disc disease is noted at C5-6 and C6-7 with posterior osteophyte formation. Minimal hypertrophy of posterior facet joints is noted at multiple levels.  IMPRESSION: Minimal diffuse cortical atrophy. Mild chronic ischemic white matter disease. No acute intracranial abnormality seen.  Multilevel degenerative disc disease is noted. No acute abnormality seen in the cervical spine.   Electronically Signed   By: Roque LiasJames  Green M.D.   On: 08/07/2014 13:18   Ct Cervical Spine Wo Contrast  08/07/2014   CLINICAL DATA:  Loss of consciousness. Fell in kitchen and hit floor.  EXAM: CT HEAD WITHOUT CONTRAST  CT CERVICAL SPINE WITHOUT CONTRAST  TECHNIQUE: Multidetector CT imaging of the head and cervical spine was performed following the standard protocol without intravenous contrast. Multiplanar CT image reconstructions of the cervical spine were also generated.  COMPARISON:  CT scan of head of May 14, 2014.  FINDINGS: CT HEAD FINDINGS  Bony calvarium appears intact. Minimal diffuse cortical atrophy is noted. Mild chronic ischemic white matter disease is noted. No mass effect or midline shift is noted. Ventricular size is within normal limits. There is no evidence of mass lesion, hemorrhage or acute infarction.  CT CERVICAL SPINE FINDINGS  No fracture or spondylolisthesis is noted. Mild degenerative disc disease is noted at C4-5. Severe degenerative disc disease is noted at C5-6 and C6-7 with posterior osteophyte formation. Minimal hypertrophy of posterior facet joints is noted at multiple levels.  IMPRESSION: Minimal diffuse cortical atrophy. Mild chronic ischemic white matter disease. No  acute intracranial abnormality seen.  Multilevel degenerative disc disease is noted. No acute abnormality seen in the cervical spine.   Electronically Signed   By: Roque Lias M.D.   On: 08/07/2014 13:18   Dg Chest Port 1 View  08/07/2014   CLINICAL DATA:  Patient experienced an episode of loss of consciousness today and fell in kitchen. Patient has a history of COPD and hypertension. Ex smoker.  EXAM: PORTABLE CHEST - 1 VIEW  COMPARISON:  05/14/2014.  FINDINGS: Cardiac silhouette is mildly enlarged. The aorta is tortuous. No mediastinal or hilar masses or convincing adenopathy.  Lungs are clear.  No pleural effusion or pneumothorax.  Bony thorax is demineralized. There are old left-sided healed rib fractures, stable.  IMPRESSION: No acute cardiopulmonary disease.   Electronically Signed   By: Amie Portland M.D.   On: 08/07/2014 12:54     Assessment/Plan: 1. 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 Remarkably she is very coherent and not showing much in the way of neuro symptoms. Agree with NS, also 2g NaCl tablets TID with meals. Likely will need fluid restriction as well. 2. Lymphocytosis: also appears to be a chronic issue, heme-onc? 3. Anemia: stable 4. COPD: per primary 5. HTN: hold HCTZ, can restart losartan or labetalol for control  6. Tongue lesion: monitor, may be from syncope/seizure tongue biting.   Tawni Carnes 08/07/2014, 5:16 PM   Renal Attending: She has a history of chronic hyponatremia presenting today with a syncopal episode and lower sodium to 116 mg/dl.  She is remarkable lucid consistent with the chronicity of the condition.  She drinks about a quart of water daily plus a six pack of beer(she and 2 others drink two cases daily at home).  She has not had a prior evaluation, but UA in July revealed hypotonic urine.  She reports nocturia x many.  I am told she was orthostatic on admission.  She has  a history of hypertension and meds include a thiazide.  Additional urine studies are pending.   Clinically, she cannot have SIADH as pt is not euvolemic.   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  I have advised her to eat more & drink less.  She will probably take weeks to fix the polyuria she experiences due to [facility] wash out.   I agree with NS IV and would add sodium chloride tablets  and increase solute intake.  It is reasonable to stop the thiazide due to hypovolemia. If the tongue lesion is not traumatic, it will need evaluation Jolanda Mccann C

## 2014-08-07 NOTE — ED Notes (Signed)
MD at bedside. 

## 2014-08-07 NOTE — ED Provider Notes (Signed)
I saw and evaluated the patient, reviewed the resident's note and I agree with the findings and plan.   .Face to face Exam:  General:  Awake HEENT:  Atraumatic Resp:  Normal effort Abd:  Nondistended Neuro:No focal weakness   EKG was reviewed and discussed with resident  Nelia Shiobert L Dominico Rod, MD 08/07/14 2018

## 2014-08-08 DIAGNOSIS — G8929 Other chronic pain: Secondary | ICD-10-CM

## 2014-08-08 DIAGNOSIS — M549 Dorsalgia, unspecified: Secondary | ICD-10-CM

## 2014-08-08 DIAGNOSIS — E871 Hypo-osmolality and hyponatremia: Secondary | ICD-10-CM | POA: Diagnosis not present

## 2014-08-08 DIAGNOSIS — J449 Chronic obstructive pulmonary disease, unspecified: Secondary | ICD-10-CM

## 2014-08-08 DIAGNOSIS — R569 Unspecified convulsions: Secondary | ICD-10-CM

## 2014-08-08 DIAGNOSIS — D72829 Elevated white blood cell count, unspecified: Secondary | ICD-10-CM

## 2014-08-08 DIAGNOSIS — D649 Anemia, unspecified: Secondary | ICD-10-CM

## 2014-08-08 DIAGNOSIS — I10 Essential (primary) hypertension: Secondary | ICD-10-CM

## 2014-08-08 LAB — BASIC METABOLIC PANEL
ANION GAP: 8 (ref 5–15)
Anion gap: 11 (ref 5–15)
Anion gap: 8 (ref 5–15)
Anion gap: 9 (ref 5–15)
BUN: 17 mg/dL (ref 6–23)
BUN: 14 mg/dL (ref 6–23)
BUN: 13 mg/dL (ref 6–23)
BUN: 16 mg/dL (ref 6–23)
CALCIUM: 9.6 mg/dL (ref 8.4–10.5)
CHLORIDE: 84 meq/L — AB (ref 96–112)
CHLORIDE: 85 meq/L — AB (ref 96–112)
CHLORIDE: 88 meq/L — AB (ref 96–112)
CO2: 27 mEq/L (ref 19–32)
CO2: 30 mEq/L (ref 19–32)
CO2: 31 meq/L (ref 19–32)
CO2: 30 mEq/L (ref 19–32)
CREATININE: 0.99 mg/dL (ref 0.50–1.10)
CREATININE: 0.95 mg/dL (ref 0.50–1.10)
Calcium: 8.8 mg/dL (ref 8.4–10.5)
Calcium: 9.4 mg/dL (ref 8.4–10.5)
Calcium: 9.6 mg/dL (ref 8.4–10.5)
Chloride: 88 mEq/L — ABNORMAL LOW (ref 96–112)
Creatinine, Ser: 1.21 mg/dL — ABNORMAL HIGH (ref 0.50–1.10)
Creatinine, Ser: 0.95 mg/dL (ref 0.50–1.10)
GFR calc Af Amer: 49 mL/min — ABNORMAL LOW (ref >90)
GFR calc Af Amer: 65 mL/min — ABNORMAL LOW (ref >90)
GFR calc non Af Amer: 42 mL/min — ABNORMAL LOW (ref >90)
GFR calc non Af Amer: 54 mL/min — ABNORMAL LOW (ref >90)
GFR calc non Af Amer: 56 mL/min — ABNORMAL LOW (ref >90)
GFR calc non Af Amer: 56 mL/min — ABNORMAL LOW (ref >90)
GFR, EST AFRICAN AMERICAN: 62 mL/min — AB (ref >90)
GFR, EST AFRICAN AMERICAN: 65 mL/min — AB (ref >90)
GLUCOSE: 138 mg/dL — AB (ref 70–99)
Glucose, Bld: 105 mg/dL — ABNORMAL HIGH (ref 70–99)
Glucose, Bld: 137 mg/dL — ABNORMAL HIGH (ref 70–99)
Glucose, Bld: 141 mg/dL — ABNORMAL HIGH (ref 70–99)
Potassium: 4 mEq/L (ref 3.7–5.3)
Potassium: 4.5 mEq/L (ref 3.7–5.3)
Potassium: 4.4 mEq/L (ref 3.7–5.3)
Potassium: 4.2 mEq/L (ref 3.7–5.3)
SODIUM: 122 meq/L — AB (ref 137–147)
SODIUM: 123 meq/L — AB (ref 137–147)
Sodium: 127 mEq/L — ABNORMAL LOW (ref 137–147)
Sodium: 127 mEq/L — ABNORMAL LOW (ref 137–147)

## 2014-08-08 LAB — COMPREHENSIVE METABOLIC PANEL
ALT: 18 U/L (ref 0–35)
AST: 26 U/L (ref 0–37)
Albumin: 3.4 g/dL — ABNORMAL LOW (ref 3.5–5.2)
Alkaline Phosphatase: 62 U/L (ref 39–117)
Anion gap: 9 (ref 5–15)
BUN: 15 mg/dL (ref 6–23)
CALCIUM: 9.2 mg/dL (ref 8.4–10.5)
CO2: 27 mEq/L (ref 19–32)
Chloride: 86 mEq/L — ABNORMAL LOW (ref 96–112)
Creatinine, Ser: 1.01 mg/dL (ref 0.50–1.10)
GFR calc non Af Amer: 52 mL/min — ABNORMAL LOW (ref >90)
GFR, EST AFRICAN AMERICAN: 61 mL/min — AB (ref >90)
GLUCOSE: 104 mg/dL — AB (ref 70–99)
Potassium: 4.6 mEq/L (ref 3.7–5.3)
Sodium: 122 mEq/L — ABNORMAL LOW (ref 137–147)
TOTAL PROTEIN: 5.9 g/dL — AB (ref 6.0–8.3)
Total Bilirubin: 0.5 mg/dL (ref 0.3–1.2)

## 2014-08-08 LAB — CBC
HCT: 28.5 % — ABNORMAL LOW (ref 36.0–46.0)
Hemoglobin: 10.1 g/dL — ABNORMAL LOW (ref 12.0–15.0)
MCH: 31.4 pg (ref 26.0–34.0)
MCHC: 35.4 g/dL (ref 30.0–36.0)
MCV: 88.5 fL (ref 78.0–100.0)
Platelets: 204 10*3/uL (ref 150–400)
RBC: 3.22 MIL/uL — AB (ref 3.87–5.11)
RDW: 12.8 % (ref 11.5–15.5)
WBC: 15 10*3/uL — AB (ref 4.0–10.5)

## 2014-08-08 LAB — GLUCOSE, CAPILLARY
Glucose-Capillary: 152 mg/dL — ABNORMAL HIGH (ref 70–99)
Glucose-Capillary: 135 mg/dL — ABNORMAL HIGH (ref 70–99)

## 2014-08-08 LAB — OSMOLALITY: Osmolality: 237 mOsm/kg — ABNORMAL LOW (ref 275–300)

## 2014-08-08 MED ORDER — ENSURE COMPLETE PO LIQD
237.0000 mL | ORAL | Status: DC
  Administered 2014-08-08 – 2014-08-09 (×2): 237 mL via ORAL

## 2014-08-08 MED ORDER — LABETALOL HCL 200 MG PO TABS
200.0000 mg | ORAL_TABLET | Freq: Once | ORAL | Status: AC
  Administered 2014-08-08: 200 mg via ORAL
  Filled 2014-08-08: qty 1

## 2014-08-08 MED ORDER — ONDANSETRON HCL 4 MG/2ML IJ SOLN
4.0000 mg | Freq: Four times a day (QID) | INTRAMUSCULAR | Status: DC | PRN
  Administered 2014-08-08: 4 mg via INTRAVENOUS
  Filled 2014-08-08: qty 2

## 2014-08-08 MED ORDER — TRIAMCINOLONE ACETONIDE 0.1 % EX CREA
1.0000 "application " | TOPICAL_CREAM | Freq: Two times a day (BID) | CUTANEOUS | Status: DC | PRN
  Administered 2014-08-08 (×2): 1 via TOPICAL
  Filled 2014-08-08: qty 15

## 2014-08-08 NOTE — Progress Notes (Signed)
  I have seen and examined the patient, and reviewed the daily progress note by Duwayne Heckanielle Day, MS 3 and discussed the care of the patient with them. Please see my progress note from 08/08/2014 for further details regarding assessment and plan.    Signed:  Lorenda HatchetAdam L Aariv Medlock, MD 08/08/2014, 1:05 PM

## 2014-08-08 NOTE — Progress Notes (Signed)
+   vomiting observed after eating some breakfast, MD notified. New order received. Zofran 4mg  given as ordered. Will monitor.    Order for transfer acknowledged, pt informed and verbalized understanding. Rm [id number] available, report called to Sharia ReeveJosh, RN. Belongings packed and pt transferred via w/c. Will cont to monitor.

## 2014-08-08 NOTE — Progress Notes (Signed)
S: Feeling very hungry, otherwise no complaints O:BP 170/62  Pulse 73  Temp(Src) 97.6 F (36.4 C) (Oral)  Resp 18  Ht 5\' 4"  (1.626 m)  Wt 154 lb 15.7 oz (70.3 kg)  BMI 26.59 kg/m2  SpO2 99%  Intake/Output Summary (Last 24 hours) at 08/08/14 1008 Last data filed at 08/08/14 0935  Gross per 24 hour  Intake 1825.08 ml  Output      0 ml  Net 1825.08 ml   Intake/Output: I/O last 3 completed shifts: In: 1822.1 [P.O.:220; I.V.:1602.1] Out: -   Intake/Output this shift:  Total I/O In: 3 [I.V.:3] Out: -  Weight change:  Gen: NAD, alert and cooperative CVS:RRR, no mrg Resp:CTAB, normal wob ZOX:WRUEAbd:soft, ntnd AVW:UJWJXExt:trace edema   Recent Labs Lab 08/07/14 1239 08/07/14 1819 08/07/14 2145 08/08/14 0201 08/08/14 0647 08/08/14 0816  NA 116* 118* 120* 122* 122* 123*  K 4.5 4.3 4.2 4.0 4.6 4.5  CL 77* 78* 82* 84* 86* 85*  CO2 28 29 29 27 27 30   GLUCOSE 101* 104* 133* 138* 104* 105*  BUN 13 12 12 17 15 14   CREATININE 0.92 0.84 1.16* 1.21* 1.01 0.99  ALBUMIN 3.8  --   --   --  3.4*  --   CALCIUM 9.3 9.2 9.4 8.8 9.2 9.4  AST 29  --   --   --  26  --   ALT 19  --   --   --  18  --    Liver Function Tests:  Recent Labs Lab 08/07/14 1239 08/08/14 0647  AST 29 26  ALT 19 18  ALKPHOS 66 62  BILITOT 0.7 0.5  PROT 6.2 5.9*  ALBUMIN 3.8 3.4*   No results found for this basename: LIPASE, AMYLASE,  in the last 168 hours No results found for this basename: AMMONIA,  in the last 168 hours CBC:  Recent Labs Lab 08/07/14 1239 08/08/14 0647  WBC 16.7* 15.0*  NEUTROABS 6.3  --   HGB 10.2* 10.1*  HCT 28.6* 28.5*  MCV 86.9 88.5  PLT 209 204   Cardiac Enzymes:  Recent Labs Lab 08/07/14 1239  TROPONINI <0.30   CBG:  Recent Labs Lab 08/07/14 1321  GLUCAP 116*    Iron Studies:   Recent Labs  08/07/14 1819  IRON 100  TIBC 287  FERRITIN 186   Studies/Results: Ct Head Wo Contrast  08/07/2014   CLINICAL DATA:  Loss of consciousness. Fell in kitchen and hit  floor.  EXAM: CT HEAD WITHOUT CONTRAST  CT CERVICAL SPINE WITHOUT CONTRAST  TECHNIQUE: Multidetector CT imaging of the head and cervical spine was performed following the standard protocol without intravenous contrast. Multiplanar CT image reconstructions of the cervical spine were also generated.  COMPARISON:  CT scan of head of May 14, 2014.  FINDINGS: CT HEAD FINDINGS  Bony calvarium appears intact. Minimal diffuse cortical atrophy is noted. Mild chronic ischemic white matter disease is noted. No mass effect or midline shift is noted. Ventricular size is within normal limits. There is no evidence of mass lesion, hemorrhage or acute infarction.  CT CERVICAL SPINE FINDINGS  No fracture or spondylolisthesis is noted. Mild degenerative disc disease is noted at C4-5. Severe degenerative disc disease is noted at C5-6 and C6-7 with posterior osteophyte formation. Minimal hypertrophy of posterior facet joints is noted at multiple levels.  IMPRESSION: Minimal diffuse cortical atrophy. Mild chronic ischemic white matter disease. No acute intracranial abnormality seen.  Multilevel degenerative disc disease is noted. No acute  abnormality seen in the cervical spine.   Electronically Signed   By: James  Green M.D.   On: 10/23/2015Roque Lias 13:18   Ct Cervical Spine Wo Contrast  08/07/2014   CLINICAL DATA:  Loss of consciousness. Fell in kitchen and hit floor.  EXAM: CT HEAD WITHOUT CONTRAST  CT CERVICAL SPINE WITHOUT CONTRAST  TECHNIQUE: Multidetector CT imaging of the head and cervical spine was performed following the standard protocol without intravenous contrast. Multiplanar CT image reconstructions of the cervical spine were also generated.  COMPARISON:  CT scan of head of May 14, 2014.  FINDINGS: CT HEAD FINDINGS  Bony calvarium appears intact. Minimal diffuse cortical atrophy is noted. Mild chronic ischemic white matter disease is noted. No mass effect or midline shift is noted. Ventricular size is within normal limits.  There is no evidence of mass lesion, hemorrhage or acute infarction.  CT CERVICAL SPINE FINDINGS  No fracture or spondylolisthesis is noted. Mild degenerative disc disease is noted at C4-5. Severe degenerative disc disease is noted at C5-6 and C6-7 with posterior osteophyte formation. Minimal hypertrophy of posterior facet joints is noted at multiple levels.  IMPRESSION: Minimal diffuse cortical atrophy. Mild chronic ischemic white matter disease. No acute intracranial abnormality seen.  Multilevel degenerative disc disease is noted. No acute abnormality seen in the cervical spine.   Electronically Signed   By: Roque LiasJames  Green M.D.   On: 08/07/2014 13:18   Dg Chest Port 1 View  08/07/2014   CLINICAL DATA:  Patient experienced an episode of loss of consciousness today and fell in kitchen. Patient has a history of COPD and hypertension. Ex smoker.  EXAM: PORTABLE CHEST - 1 VIEW  COMPARISON:  05/14/2014.  FINDINGS: Cardiac silhouette is mildly enlarged. The aorta is tortuous. No mediastinal or hilar masses or convincing adenopathy.  Lungs are clear.  No pleural effusion or pneumothorax.  Bony thorax is demineralized. There are old left-sided healed rib fractures, stable.  IMPRESSION: No acute cardiopulmonary disease.   Electronically Signed   By: Amie Portlandavid  Ormond M.D.   On: 08/07/2014 12:54   . Chlorhexidine Gluconate Cloth  6 each Topical Q0600  . folic acid  1 mg Oral Daily  . heparin  5,000 Units Subcutaneous 3 times per day  . losartan  100 mg Oral Daily  . multivitamin with minerals  1 tablet Oral Daily  . mupirocin ointment  1 application Nasal BID  . sodium chloride  3 mL Intravenous Q12H  . sodium chloride  2 g Oral TID WC  . thiamine  100 mg Oral Daily   Or  . thiamine  100 mg Intravenous Daily  . tiotropium  18 mcg Inhalation Daily    Assessment/Plan:  1. 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 Remarkably she is very  coherent and not showing much in the way of neuro symptoms.  1. Continue NS and 2g NaCl tablets TID with meals. Fluid restriction added. Would not discharge until sodium at least 130. Recommend increased food and less alcohol at home. We will sign off now. 2. Lymphocytosis: also appears to be a chronic issue, per primary 3. Anemia: stable 4. COPD: per primary 5. HTN: hold HCTZ, still uncontrolled with losartan alone, likely requires another agent 6. Tongue lesion: monitor, may be from syncope/seizure tongue biting.   Beverely LowAdamo, Elena  Renal Attending: She has improved with a rising sodium.  She reports that she drinks a couple of cans  of beer overnight. Recs as in Dr. Cherre Huger note.  We will sign off.   Stephan Draughn C

## 2014-08-08 NOTE — Plan of Care (Signed)
Problem: Consults Goal: General Medical Patient Education See Patient Education Module for specific education. Hyponatremia

## 2014-08-08 NOTE — Progress Notes (Signed)
Subjective: NAEON. BMET was monitored q4h and NS IVF was discontinued at sodium 122. We discussed not drinking pure water and cutting down on beers. She has no complaints and specifically denies any dizziness, lightheadedness, numbness, paresthesias or any pain.  Objective: Vital signs in last 24 hours: Filed Vitals:   08/08/14 0400 08/08/14 0600 08/08/14 0800 08/08/14 0824  BP: 134/35 181/49 170/62   Pulse: 62 70 73   Temp:    97.6 F (36.4 C)  TempSrc:      Resp: 16 14 18    Height:      Weight:      SpO2: 95% 95% 99%    Weight change:   Intake/Output Summary (Last 24 hours) at 08/08/14 1040 Last data filed at 08/08/14 0935  Gross per 24 hour  Intake 1825.08 ml  Output      0 ml  Net 1825.08 ml   Gen: A&O x 4, elderly, no acute distress  HEENT: Atraumatic, PERRL, EOMI, both eyes slightly injected with clear exudation, moist mucous membranes  Heart: Regular rate and rhythm, normal S1 S2, no murmurs, rubs, or gallops  Lungs: Clear to auscultation bilaterally, respirations unlabored  Abd: Soft, non-tender, non-distended, + bowel sounds, no hepatosplenomegaly  Ext: No edema or cyanosis, 2+ peripheral pulses, no skin tenting  Neuro: CN II-XII intact, strength 5/5 and symmetric in upper and lower extremities, no Babinkski sign  Lab Results: Basic Metabolic Panel:  Recent Labs Lab 08/08/14 0647 08/08/14 0816  NA 122* 123*  K 4.6 4.5  CL 86* 85*  CO2 27 30  GLUCOSE 104* 105*  BUN 15 14  CREATININE 1.01 0.99  CALCIUM 9.2 9.4   Liver Function Tests:  Recent Labs Lab 08/07/14 1239 08/08/14 0647  AST 29 26  ALT 19 18  ALKPHOS 66 62  BILITOT 0.7 0.5  PROT 6.2 5.9*  ALBUMIN 3.8 3.4*   CBC:  Recent Labs Lab 08/07/14 1239 08/08/14 0647  WBC 16.7* 15.0*  NEUTROABS 6.3  --   HGB 10.2* 10.1*  HCT 28.6* 28.5*  MCV 86.9 88.5  PLT 209 204   Cardiac Enzymes:  Recent Labs Lab 08/07/14 1239  TROPONINI <0.30  CBG:  Recent Labs Lab 08/07/14 1321    GLUCAP 116*   Thyroid Function Tests:  Recent Labs Lab 08/07/14 2121  TSH 0.927   Coagulation:  Recent Labs Lab 08/07/14 1239  LABPROT 15.6*  INR 1.23   Anemia Panel:  Recent Labs Lab 08/07/14 1819  VITAMINB12 907  FOLATE >20.0  FERRITIN 186  TIBC 287  IRON 100  RETICCTPCT 1.5   Urinalysis:  Recent Labs Lab 08/07/14 1715  COLORURINE YELLOW  LABSPEC 1.007  PHURINE 6.5  GLUCOSEU NEGATIVE  HGBUR TRACE*  BILIRUBINUR NEGATIVE  KETONESUR NEGATIVE  PROTEINUR NEGATIVE  UROBILINOGEN 1.0  NITRITE NEGATIVE  LEUKOCYTESUR TRACE*   Misc. Labs: Serum osmolality 237 Urine osmolality 187 FeNa 0.3% on presentation [place] 3.7% on presentation  Micro Results: Recent Results (from the past 240 hour(s))  MRSA PCR SCREENING     Status: Abnormal   Collection Time    08/07/14  8:09 PM      Result Value Ref Range Status   MRSA by PCR POSITIVE (*) NEGATIVE Final   Comment:            The GeneXpert MRSA Assay (FDA     approved for NASAL specimens     only), is one component of a     comprehensive MRSA colonization  surveillance program. It is not     intended to diagnose MRSA     infection nor to guide or     monitor treatment for     MRSA infections.     RESULT CALLED TO, READ BACK BY AND VERIFIED WITH:     Franchot GalloM ANINON RN [id number] 08/07/14 A BROWNING   Studies/Results: Ct Head Wo Contrast  08/07/2014   CLINICAL DATA:  Loss of consciousness. Fell in kitchen and hit floor.  EXAM: CT HEAD WITHOUT CONTRAST  CT CERVICAL SPINE WITHOUT CONTRAST  TECHNIQUE: Multidetector CT imaging of the head and cervical spine was performed following the standard protocol without intravenous contrast. Multiplanar CT image reconstructions of the cervical spine were also generated.  COMPARISON:  CT scan of head of May 14, 2014.  FINDINGS: CT HEAD FINDINGS  Bony calvarium appears intact. Minimal diffuse cortical atrophy is noted. Mild chronic ischemic white matter disease is noted. No mass  effect or midline shift is noted. Ventricular size is within normal limits. There is no evidence of mass lesion, hemorrhage or acute infarction.  CT CERVICAL SPINE FINDINGS  No fracture or spondylolisthesis is noted. Mild degenerative disc disease is noted at C4-5. Severe degenerative disc disease is noted at C5-6 and C6-7 with posterior osteophyte formation. Minimal hypertrophy of posterior facet joints is noted at multiple levels.  IMPRESSION: Minimal diffuse cortical atrophy. Mild chronic ischemic white matter disease. No acute intracranial abnormality seen.  Multilevel degenerative disc disease is noted. No acute abnormality seen in the cervical spine.   Electronically Signed   By: Roque LiasJames  Green M.D.   On: 08/07/2014 13:18   Ct Cervical Spine Wo Contrast  08/07/2014   CLINICAL DATA:  Loss of consciousness. Fell in kitchen and hit floor.  EXAM: CT HEAD WITHOUT CONTRAST  CT CERVICAL SPINE WITHOUT CONTRAST  TECHNIQUE: Multidetector CT imaging of the head and cervical spine was performed following the standard protocol without intravenous contrast. Multiplanar CT image reconstructions of the cervical spine were also generated.  COMPARISON:  CT scan of head of May 14, 2014.  FINDINGS: CT HEAD FINDINGS  Bony calvarium appears intact. Minimal diffuse cortical atrophy is noted. Mild chronic ischemic white matter disease is noted. No mass effect or midline shift is noted. Ventricular size is within normal limits. There is no evidence of mass lesion, hemorrhage or acute infarction.  CT CERVICAL SPINE FINDINGS  No fracture or spondylolisthesis is noted. Mild degenerative disc disease is noted at C4-5. Severe degenerative disc disease is noted at C5-6 and C6-7 with posterior osteophyte formation. Minimal hypertrophy of posterior facet joints is noted at multiple levels.  IMPRESSION: Minimal diffuse cortical atrophy. Mild chronic ischemic white matter disease. No acute intracranial abnormality seen.  Multilevel  degenerative disc disease is noted. No acute abnormality seen in the cervical spine.   Electronically Signed   By: Roque LiasJames  Green M.D.   On: 08/07/2014 13:18   Dg Chest Port 1 View  08/07/2014   CLINICAL DATA:  Patient experienced an episode of loss of consciousness today and fell in kitchen. Patient has a history of COPD and hypertension. Ex smoker.  EXAM: PORTABLE CHEST - 1 VIEW  COMPARISON:  05/14/2014.  FINDINGS: Cardiac silhouette is mildly enlarged. The aorta is tortuous. No mediastinal or hilar masses or convincing adenopathy.  Lungs are clear.  No pleural effusion or pneumothorax.  Bony thorax is demineralized. There are old left-sided healed rib fractures, stable.  IMPRESSION: No acute cardiopulmonary disease.   Electronically Signed  By: Amie Portland M.D.   On: 08/07/2014 12:54   Medications: I have reviewed the patient's current medications. Scheduled Meds: . Chlorhexidine Gluconate Cloth  6 each Topical Q0600  . folic acid  1 mg Oral Daily  . heparin  5,000 Units Subcutaneous 3 times per day  . losartan  100 mg Oral Daily  . multivitamin with minerals  1 tablet Oral Daily  . mupirocin ointment  1 application Nasal BID  . sodium chloride  3 mL Intravenous Q12H  . sodium chloride  2 g Oral TID WC  . thiamine  100 mg Oral Daily   Or  . thiamine  100 mg Intravenous Daily  . tiotropium  18 mcg Inhalation Daily   Continuous Infusions:  PRN Meds:.acetaminophen, acetaminophen, albuterol, LORazepam, LORazepam, ondansetron (ZOFRAN) IV, triamcinolone cream Assessment/Plan: Active Problems:   Hyponatremia  #Seizure in setting of Hyponatremia: Patient has been asymptomatic since admission. Seen by neuro who agrees that patient sounds to have had a seizure in the setting of sodium 116. Given history of extensive beer and water drinking, beer potomania likely. FeNa 0.3% is consistent with diganosis as is lower serum osmolality with attempt to concentrate urine. She was on NS IVF 125cc/hr  until N a 122. Renal started NaCl tabs 2 g TID which appears to be working. We discussed that the patient should drink gatorade or ensure rather than pure water. We also discussed that she should stop or reduce drinking beer. Patient continues to be orthostatic. Goal Na>130 but will aim to not raise more than 6-8 mEq/day due to risk of CPM. -appreciate neuro, renal -transfer from stepdown to tele -cont NaCl tabs 2 g TID -hold NS IVF for now and reassess -q4h BMET  -cardiac monitoring  -lipid panel in am to rule out pseudohyponatremia -PT/OT as fall risk  -CIWA protocol -seizure precautions -dietitician  #Lymphocyte Predominant Leukocytosis: WBC 16.7 with 57% lymphocytes on admission now 15.0. Patient is afebrile Noted on previous admission in 04/2014 when she had WBC 20.1 w predominant lymphocytes 54%. She was previously seen by Dr Gaylyn Rong in 04/2012 who thought this was 2/2 recurrent UTIs. She has not been evaluated by hematology-oncology. No signs of infection as afebrile and ROS not concerning  -peripheral blood smear  -consider outpatient heme onc f/u  #Anemia: Hemoglobin now 10.1 but on admission 10.2 and was 10.7 on discharge. Hemodynamically stable. Anemia panel normal  -FOBC  -peripheral blood smear   #COPD: I could not find PFTs in the patient's EMR. Ms Mandrell uses 2 L O2 [place] when needed at home and currently takes at home tiotropium 2.5 mcg puff daily and albuterol 1 puff q6hprn. Patient reports dry chronic cough that is unchanged. Lungs CTAB and oxygen sat 95-100% on 2L [place].  -cont tiotropium 2.64mcg inh daily and albuterol 1 puff q6hprn  -O2 via [place] as required for O2 sat > 92%   #HTN: Ms Capistran's BP was up in the 170s-180s systolic overnight. She currently prescribed losartan 100 mg- HCTZ 12.5 mg daily and labetalol 200 mg BID but only taking labetalol once at home.  -cont losartan 100 mg daily -restart labetalol 200 mg at daily for now and reassess  #Chronic Back Pain: Chronic  issue. Degenerative disc disease on CT c spin so likely osteoarthritic throughout.  -acetaminophen 650 mg po q6hprn   #Diet: regular   #VTE PPx: heparin 5000 u [place] tid, SCDs   Dispo: Disposition is deferred at this time, awaiting improvement of current medical problems.  Anticipated discharge in approximately 2 day(s).   The patient does have a current PCP Marin Comment, FNP) and does need an [facility] hospital follow-up appointment after discharge.  The patient does not know have transportation limitations that hinder transportation to clinic appointments.  .Services Needed at time of discharge: Y = Yes, Blank = No PT:   OT:   RN:   Equipment:   Other:     LOS: 1 day   Lorenda Hatchet, MD 08/08/2014, 10:40 AM

## 2014-08-08 NOTE — Progress Notes (Signed)
Subjective: No acute events overnight. She was taken off of IVF's because her serum Na+ went form 116-122. She was kept on sodium pills. Hypertensive up to 202/83 overnight.   Objective: Vital signs in last 24 hours: Filed Vitals:   08/08/14 0400 08/08/14 0600 08/08/14 0800 08/08/14 0824  BP: 134/35 181/49 170/62   Pulse: 62 70 73   Temp:    97.6 F (36.4 C)  TempSrc:      Resp: 16 14 18    Height:      Weight:      SpO2: 95% 95% 99%    Weight change:   Intake/Output Summary (Last 24 hours) at 08/08/14 0951 Last data filed at 08/08/14 0935  Gross per 24 hour  Intake 1825.08 ml  Output      0 ml  Net 1825.08 ml   BP 170/62  Pulse 73  Temp(Src) 97.6 F (36.4 C) (Oral)  Resp 18  Ht 5\' 4"  (1.626 m)  Wt 70.3 kg (154 lb 15.7 oz)  BMI 26.59 kg/m2  SpO2 99%  Gen: A&O x 4, elderly, no acute distress  HEENT: Atraumatic, PERRL, EOMI, both eyes slightly injected with clear exudation, moist mucous membranes  Heart: Regular rate and rhythm, normal S1 S2, no murmurs, rubs, or gallops  Lungs: Clear to auscultation bilaterally, respirations unlabored  Abd: Soft, non-tender, non-distended, + bowel sounds, no hepatosplenomegaly  Ext: No edema or cyanosis, 2+ peripheral pulses, no skin tenting  Neuro: CN II-XII intact, strength 5/5 and symmetric in upper and lower extremities, no Babinkski sign   Lab Results:  Basic Metabolic Panel:   Recent Labs  Lab  08/08/14 0647  08/08/14 0816   NA  122*  123*   K  4.6  4.5   CL  86*  85*   CO2  27  30   GLUCOSE  104*  105*   BUN  15  14   CREATININE  1.01  0.99   CALCIUM  9.2  9.4    Liver Function Tests:   Recent Labs  Lab  08/07/14 1239  08/08/14 0647   AST  29  26   ALT  19  18   ALKPHOS  66  62   BILITOT  0.7  0.5   PROT  6.2  5.9*   ALBUMIN  3.8  3.4*    CBC:   Recent Labs  Lab  08/07/14 1239  08/08/14 0647   WBC  16.7*  15.0*   NEUTROABS  6.3  --   HGB  10.2*  10.1*   HCT  28.6*  28.5*   MCV  86.9  88.5     PLT  209  204    Cardiac Enzymes:   Recent Labs  Lab  08/07/14 1239   TROPONINI  <0.30   CBG:   Recent Labs  Lab  08/07/14 1321   GLUCAP  116*    Thyroid Function Tests:   Recent Labs  Lab  08/07/14 2121   TSH  0.927    Coagulation:   Recent Labs  Lab  08/07/14 1239   LABPROT  15.6*   INR  1.23    Anemia Panel:   Recent Labs  Lab  08/07/14 1819   VITAMINB12  907   FOLATE  >20.0   FERRITIN  186   TIBC  287   IRON  100   RETICCTPCT  1.5    Urinalysis:   Recent Labs  Lab  08/07/14 1715  COLORURINE  YELLOW   LABSPEC  1.007   PHURINE  6.5   GLUCOSEU  NEGATIVE   HGBUR  TRACE*   BILIRUBINUR  NEGATIVE   KETONESUR  NEGATIVE   PROTEINUR  NEGATIVE   UROBILINOGEN  1.0   NITRITE  NEGATIVE   LEUKOCYTESUR  TRACE*    Misc. Labs:  Serum osmolality 237  Urine osmolality 187  FeNa 0.3% on presentation  Feurea 3.7% on presentation   Micro Results: Recent Results (from the past 240 hour(s))  MRSA PCR SCREENING     Status: Abnormal   Collection Time    08/07/14  8:09 PM      Result Value Ref Range Status   MRSA by PCR POSITIVE (*) NEGATIVE Final   Comment:            The GeneXpert MRSA Assay (FDA     approved for NASAL specimens     only), is one component of a     comprehensive MRSA colonization     surveillance program. It is not     intended to diagnose MRSA     infection nor to guide or     monitor treatment for     MRSA infections.     RESULT CALLED TO, READ BACK BY AND VERIFIED WITH:     Franchot Gallo RN [id number] 08/07/14 A BROWNING   Studies/Results: Ct Head Wo Contrast  08/07/2014   CLINICAL DATA:  Loss of consciousness. Fell in kitchen and hit floor.  EXAM: CT HEAD WITHOUT CONTRAST  CT CERVICAL SPINE WITHOUT CONTRAST  TECHNIQUE: Multidetector CT imaging of the head and cervical spine was performed following the standard protocol without intravenous contrast. Multiplanar CT image reconstructions of the cervical spine were also generated.   COMPARISON:  CT scan of head of May 14, 2014.  FINDINGS: CT HEAD FINDINGS  Bony calvarium appears intact. Minimal diffuse cortical atrophy is noted. Mild chronic ischemic white matter disease is noted. No mass effect or midline shift is noted. Ventricular size is within normal limits. There is no evidence of mass lesion, hemorrhage or acute infarction.  CT CERVICAL SPINE FINDINGS  No fracture or spondylolisthesis is noted. Mild degenerative disc disease is noted at C4-5. Severe degenerative disc disease is noted at C5-6 and C6-7 with posterior osteophyte formation. Minimal hypertrophy of posterior facet joints is noted at multiple levels.  IMPRESSION: Minimal diffuse cortical atrophy. Mild chronic ischemic white matter disease. No acute intracranial abnormality seen.  Multilevel degenerative disc disease is noted. No acute abnormality seen in the cervical spine.   Electronically Signed   By: Roque Lias M.D.   On: 08/07/2014 13:18   Ct Cervical Spine Wo Contrast  08/07/2014   CLINICAL DATA:  Loss of consciousness. Fell in kitchen and hit floor.  EXAM: CT HEAD WITHOUT CONTRAST  CT CERVICAL SPINE WITHOUT CONTRAST  TECHNIQUE: Multidetector CT imaging of the head and cervical spine was performed following the standard protocol without intravenous contrast. Multiplanar CT image reconstructions of the cervical spine were also generated.  COMPARISON:  CT scan of head of May 14, 2014.  FINDINGS: CT HEAD FINDINGS  Bony calvarium appears intact. Minimal diffuse cortical atrophy is noted. Mild chronic ischemic white matter disease is noted. No mass effect or midline shift is noted. Ventricular size is within normal limits. There is no evidence of mass lesion, hemorrhage or acute infarction.  CT CERVICAL SPINE FINDINGS  No fracture or spondylolisthesis is noted. Mild degenerative disc disease is noted at C4-5.  Severe degenerative disc disease is noted at C5-6 and C6-7 with posterior osteophyte formation. Minimal  hypertrophy of posterior facet joints is noted at multiple levels.  IMPRESSION: Minimal diffuse cortical atrophy. Mild chronic ischemic white matter disease. No acute intracranial abnormality seen.  Multilevel degenerative disc disease is noted. No acute abnormality seen in the cervical spine.   Electronically Signed   By: Roque LiasJames  Green M.D.   On: 08/07/2014 13:18   Dg Chest Port 1 View  08/07/2014   CLINICAL DATA:  Patient experienced an episode of loss of consciousness today and fell in kitchen. Patient has a history of COPD and hypertension. Ex smoker.  EXAM: PORTABLE CHEST - 1 VIEW  COMPARISON:  05/14/2014.  FINDINGS: Cardiac silhouette is mildly enlarged. The aorta is tortuous. No mediastinal or hilar masses or convincing adenopathy.  Lungs are clear.  No pleural effusion or pneumothorax.  Bony thorax is demineralized. There are old left-sided healed rib fractures, stable.  IMPRESSION: No acute cardiopulmonary disease.   Electronically Signed   By: Amie Portlandavid  Ormond M.D.   On: 08/07/2014 12:54   Medications: I have reviewed the patient's current medications. Scheduled Meds: . Chlorhexidine Gluconate Cloth  6 each Topical Q0600  . folic acid  1 mg Oral Daily  . heparin  5,000 Units Subcutaneous 3 times per Dinna Severs  . losartan  100 mg Oral Daily  . multivitamin with minerals  1 tablet Oral Daily  . mupirocin ointment  1 application Nasal BID  . sodium chloride  3 mL Intravenous Q12H  . sodium chloride  2 g Oral TID WC  . thiamine  100 mg Oral Daily   Or  . thiamine  100 mg Intravenous Daily  . tiotropium  18 mcg Inhalation Daily   Continuous Infusions:  PRN Meds:.acetaminophen, acetaminophen, albuterol, LORazepam, LORazepam Assessment/Plan: Active Problems:   Hyponatremia  Gen: A&O x 4, elderly and disheveled, no acute distress  HEENT: Atraumatic, PERRL, EOMI, both eyes slightly injected with clear exudation, moist mucous membranes, poor dentition  Heart: Regular rate and rhythm, normal S1 S2,  no murmurs, rubs, or gallops  Lungs: Clear to auscultation bilaterally, respirations unlabored  Abd: Soft, non-tender, non-distended, + bowel sounds, no hepatosplenomegaly  Ext: No edema or cyanosis, 2+ peripheral pulses, no skin tenting  Neuro: CN II-XII intact, strength 5/5 and symmetric in upper and lower extremities, no Babinkski sign   Madeline Jimenez is a 79 year old female with a PMH of COPD, HTN, Hyperlipidemia, Peripheral vascular disease, Lymphocytosis and hyponatremia who [facility] with hyponatremia after a fall and potential seizure at home.   1. Syncope: Conflicting reports from Ms. Askill v. Her family. Ms Rolm BaptiseMcAskill states this was a mechanical fall while ED notes suggest that she syncopized and family observed loss of consciousness with muscle jerking and incontinence. CT head and c spine were not concerning for bleed or fracture. EKG was normal and troponin was negative. An orthostatic component may be present as her orthostatic blood pressures and pulses were:159/68 lying with pulse 62, 147/83 sitting with pulse 64, 103/64 standing with pulse 68. Low sodium of 116 may be a component as well given seizure like muscle contractions, incontinence and confusion.  -consult neuro  -TSH normal -cardiac monitoring  -PT/OT as fall risk   2. Hyponatremia: Sodium of 116 and a history of hyponatremia. Hard to determine volume status- mucous membranes moist but extremely dry skin in addition to orthostatic hypotension. Today her FeNa is 0.3%, her Towana BadgerFeUrea is  3.7%, urine osmolality was 187 and plasma osmolality is 237, which is consistent with beer potomania. This may explain her seizure, and causes concern that her hyponatremia is symptomatic.  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 Also noticed on previous admission in 04/2014 (Wbc- 20.1, lymphocytes 54%). Previously seen by Dr. Gaylyn RongHa in 04/2012 who though it was due to recurrent UTIs. Concerning for malignancy (lymphoma) or infection (EBV)  -Peripheral blood smear will give more insight into morphology of the lymphocytes.   4. Anemia: Hemoglobin 10.2 was 10.7 on discharge. Hemodynamically stable. Could be B12/Folate deficiency (is normocytic before it becomes macrocytic) which the smear will give us insight into. It could also be iron deficiency from poor nutrition.  -FOBC  -anemia panel  -peripheral blood smear  -Daily CBCs to trend Hbs   4. COPD: No PFTs in EMR. Uses 2L O2 on [place] when needed at home (mostly at night). Also takes 2.5 mcg puff daily and albuterol 1 puff q6hprn. Smoke for 30+ years but quit 4 years ago. Patient reports dry chronic cough that is unchanged. Lungs CTAB and oxygen sat 97-100% on 2L [place].  -cont tiotropium 2.295mcg inh daily and albuterol 1 puff q6hprn  -O2 via [place] as required for O2 sat > 92%   5. HTN: Ms. MsAskill's BP was 130s-170s on presentation. She currently prescribed losartan 100 mg- HCTZ 12.5 mg daily and labetalol 200 mg BID but only taking labetalol once at home.  -holding HCTZ  -Losartan 100 mg -Adding labetalol back today due to high BP of 202/83  6. Chronic Back Pain: Chronic. Degenerative disc disease on CT spine and hand, knee aches described in history are most likely to be arthritis related pain.  -Acetomnophen 65   7. Diet: NPO until passes swallow study   8. PPx: heparin 5000 u [place] tid, SCDs   9. Dispo: Disposition is deferred at this time, awaiting improvement of current medical problems. Anticipated discharge in approximately 2 Zaidin Blyden(s  This is a Psychologist, occupationalMedical Student Note.  The care of the patient was discussed with Dr. Koren BoundNerendra and the assessment and plan formulated with their assistance.  Please see their attached note for official documentation of the  daily encounter.   LOS: 1 Rockne Dearinger   Hennie Duosanielle E [place] Nase, Med Student 08/08/2014, 9:51 AM

## 2014-08-08 NOTE — Evaluation (Signed)
Physical Therapy Evaluation Patient Details Name: Madeline BellowsDeborah E Jimenez MRN: [id number] DOB: Nov 21, 1934 Today's Date: 08/08/2014   History of Present Illness   79 year old female with a PMH of COPD, HTN, Hyperlipidemia, Peripheral vascular disease, Lymphocytosis and hyponatremia who [facility] the [facility] with hyponatremia after a fall and potential seizure at home.   Clinical Impression  Patient evaluated by Physical Therapy with no further acute PT needs identified. All education has been completed and the patient has no further questions. Son present during evaluation. Recommend (A) with stair management for safety, pt and son agreeable. See below for any follow-up Physial Therapy or equipment needs. PT is signing off. Thank you for this referral.     Follow Up Recommendations No PT follow up;Supervision/Assistance - 24 hour    Equipment Recommendations  None recommended by PT    Recommendations for Other Services       Precautions / Restrictions Precautions Precaution Comments: seizure Restrictions Weight Bearing Restrictions: No      Mobility  Bed Mobility               General bed mobility comments: pt up in chair and returned to chair  Transfers Overall transfer level: Modified independent Equipment used: None             General transfer comment: use of UEs to push up to stand  Ambulation/Gait Ambulation/Gait assistance: Supervision Ambulation Distance (Feet): 180 Feet Assistive device: Rolling walker (2 wheeled) Gait Pattern/deviations: Step-through pattern;Decreased stride length Gait velocity: WFL Gait velocity interpretation: at or above normal speed for age/gender General Gait Details: cues for RW technique; no LOB noted; pt appears at her baseline   Stairs Stairs: Yes Stairs assistance: Min assist Stair Management: No rails;Step to pattern;Forwards (handheld (A) provided for safety) Number of Stairs: 2 General stair comments: handheld  (A) provided for safety; recommend pt have (A) with entering/exiting via steps  Wheelchair Mobility    Modified Rankin (Stroke Patients Only)       Balance Overall balance assessment: No apparent balance deficits (not formally assessed)                           High level balance activites: Backward walking;Direction changes;Turns;Head turns High Level Balance Comments: no LOB noted with RW             Pertinent Vitals/Pain Pain Assessment: No/denies pain    Home Living Family/patient expects to be discharged to:: Private residence Living Arrangements: Other (Comment);Children (niece or son) Available Help at Discharge: Family;Available 24 hours/day Type of Home: House Home Access: Stairs to enter Entrance Stairs-Rails: None Entrance Stairs-Number of Steps: 2 Home Layout: One level Home Equipment: Walker - 2 wheels;Cane - single point      Prior Function Level of Independence: Independent with assistive device(s)         Comments: ambulates primarily with cane      Hand Dominance        Extremity/Trunk Assessment   Upper Extremity Assessment: Defer to OT evaluation           Lower Extremity Assessment: Overall WFL for tasks assessed      Cervical / Trunk Assessment: Normal  Communication   Communication: HOH;Other (comment) (bil hearing aides )  Cognition Arousal/Alertness: Awake/alert Behavior During Therapy: WFL for tasks assessed/performed Overall Cognitive Status: Within Functional Limits for tasks assessed  General Comments      Exercises        Assessment/Plan    PT Assessment Patent does not need any further PT services  PT Diagnosis Difficulty walking   PT Problem List    PT Treatment Interventions     PT Goals (Current goals can be found in the Care Plan section) Acute Rehab PT Goals Patient Stated Goal: to go home soon  PT Goal Formulation: All assessment and education complete, DC  therapy    Frequency     Barriers to discharge        Co-evaluation               End of Session Equipment Utilized During Treatment: Gait belt Activity Tolerance: Patient tolerated treatment well Patient left: in chair;with call bell/phone within reach;with family/visitor present;with nursing/sitter in room Nurse Communication: Mobility status         Time: [id number] PT Time Calculation (min): 14 min   Charges:   PT Evaluation $Initial PT Evaluation Tier I: 1 Procedure PT Treatments $Gait Training: 8-22 mins   PT G CodesDonell Sievert:          Amando Chaput N, [place] CarolinaPT  [phone] 08/08/2014, 4:13 PM

## 2014-08-08 NOTE — Progress Notes (Signed)
INITIAL NUTRITION ASSESSMENT  DOCUMENTATION CODES Per approved criteria  -Not Applicable   INTERVENTION: -Ensure Complete po daily, each supplement provides 350 kcal and 13 grams of protein -Educated pt on general, healthful diet and discussed idea for healthy, easy to prepare meals and snacks  NUTRITION DIAGNOSIS: Inadequate oral intake related to hx of alcohol dependency as evidenced by diet recall (pt eats only one meal per day).   Goal: Pt weill meet >90% of estimated nutritional needs  Monitor:  PO/supplement intake, labs, weight changes, I/O's  Reason for Assessment: MST=2  79 y.o. female  Admitting Dx: <principal problem not specified>  79 y/o female with PMH of COPD, HTN, Hyperlipidemia, PVD, hyponatremia who p/w fall * 1 episode. Pt states that she went to the kitchen to get water and lost her balance and fell. She states that she was awake the whole time and had no lightheadedness, diaphoresis or CP. Pt states that she normally drinks 6 beers a day and water in between the beers but does not eat much. Pt denies HA, blurry vision, syncope, abd pain, diarrhea, n/v and SOB.    ASSESSMENT: Pt admitted with hyponatremia.  Pt reports good appetite PTA and denies any weight loss. She confirms UBW of 154#.  She reveals that she vomited her eggs at breakfast this morning, but now she feels fine. She is looking forward to her lunch meal. Documented PO intake 50%. Pt freely admitted to alcohol dependency, revealing she drinks 6 beers a day. She reports that other family members that she lives with also drink; she acknowledges that it would likely be a good idea to cease drinking, but pt is unsure if she will be able to cut back due to limited support.  She eats one meal per day (dinner) which consists of chicken or fish, potatoes, and a green vegetable. Her family members prepare this meal for her. She reports they all follow a low sodium diet due to blood pressure concerns.   Discussed importance of a balanced diet to promote proper nutrition and healing. We discussed ideas for easy to prepare breakfast and dinner meals- pt was very receptive to this idea. Also discussed use of nutritional supplements to maximize intake. Pt agreeable to try Ensure.  Nutrition focused physical exam reveal no signs of fat and muscle depletion.  Noted pt on folic acid, thiamine, and MVI with minerals.  Labs reviewed. Na: 122, Cl: 86, Glucose: 104.   Height: Ht Readings from Last 1 Encounters:  08/07/14 5\' 4"  (1.626 m)    Weight: Wt Readings from Last 1 Encounters:  08/07/14 154 lb 15.7 oz (70.3 kg)    Ideal Body Weight: 120#  % Ideal Body Weight: 128%  Wt Readings from Last 10 Encounters:  08/07/14 154 lb 15.7 oz (70.3 kg)  05/14/14 157 lb 10.1 oz (71.5 kg)  05/09/12 129 lb 14.4 oz (58.922 kg)    Usual Body Weight: 154#  % Usual Body Weight: 100%  BMI:  Body mass index is 26.59 kg/(m^2). Overweight  Estimated Nutritional Needs: Kcal: 1700-1900 Protein: 77-87 grams Fluid: 1.7-1.9 L  Skin: psoriasis, rash on breast and pubic area  Diet Order: General  EDUCATION NEEDS: -Education needs addressed   Intake/Output Summary (Last 24 hours) at 08/08/14 0912 Last data filed at 08/08/14 0400  Gross per 24 hour  Intake 1822.08 ml  Output      0 ml  Net 1822.08 ml    Last BM: 08/07/14  Labs:   Recent Labs Lab 08/07/14  2145 08/08/14 0201 08/08/14 0647  NA 120* 122* 122*  K 4.2 4.0 4.6  CL 82* 84* 86*  CO2 29 27 27   BUN 12 17 15   CREATININE 1.16* 1.21* 1.01  CALCIUM 9.4 8.8 9.2  GLUCOSE 133* 138* 104*    CBG (last 3)   Recent Labs  08/07/14 1321  GLUCAP 116*    Scheduled Meds: . Chlorhexidine Gluconate Cloth  6 each Topical Q0600  . folic acid  1 mg Oral Daily  . heparin  5,000 Units Subcutaneous 3 times per day  . Influenza vac split quadrivalent PF  0.5 mL Intramuscular Tomorrow-1000  . losartan  100 mg Oral Daily  . multivitamin  with minerals  1 tablet Oral Daily  . mupirocin ointment  1 application Nasal BID  . pneumococcal 23 valent vaccine  0.5 mL Intramuscular Tomorrow-1000  . sodium chloride  3 mL Intravenous Q12H  . sodium chloride  2 g Oral TID WC  . thiamine  100 mg Oral Daily   Or  . thiamine  100 mg Intravenous Daily  . tiotropium  18 mcg Inhalation Daily    Continuous Infusions:   Past Medical History  Diagnosis Date  . COPD (chronic obstructive pulmonary disease)   . HTN (hypertension)   . Hyperlipidemia   . Peripheral vascular disease   . Lymphocytosis   . Hyponatremia   . Recurrent UTI     Past Surgical History  Procedure Laterality Date  . Abdominal hysterectomy      for fibroid  . Tonsilectomy, adenoidectomy, bilateral myringotomy and tubes      Kaiyana Bedore A. Mayford KnifeWilliams, RD, LDN Pager: [phone] After hours Pager: [phone]

## 2014-08-08 NOTE — Progress Notes (Signed)
  Date: 08/08/2014  Patient name: Bynum BellowsDeborah E Jimenez  Medical record number: [id number]  Date of birth: Jun 04, 1935   This patient has been seen and the plan of care was discussed with the house staff. Please see their note for complete details. I concur with their findings with the following additions/corrections:  Na improved, fluids stopped.  We discussed hyponatremia at bedside with patient and attempt to avoid free water (beer) consumption.  Salt tabs are continued.  Nephrology is following.  She is likely stable to transfer out of SDU.   Inez CatalinaEmily B Mullen, MD 08/08/2014, 10:39 AM

## 2014-08-08 NOTE — H&P (Signed)
  I have seen and examined the patient myself, and I have reviewed the note by Duwayne Heckanielle Day, MS 3 and was present during the interview and physical exam.  Please see my separate H&P for additional findings, assessment, and plan.   Signed: Lorenda HatchetAdam L Brigette Hopfer, MD 08/08/2014, 1:03 PM

## 2014-08-09 DIAGNOSIS — D649 Anemia, unspecified: Secondary | ICD-10-CM

## 2014-08-09 LAB — BASIC METABOLIC PANEL
ANION GAP: 9 (ref 5–15)
Anion gap: 8 (ref 5–15)
BUN: 14 mg/dL (ref 6–23)
BUN: 12 mg/dL (ref 6–23)
CO2: 29 mEq/L (ref 19–32)
CO2: 31 mEq/L (ref 19–32)
CREATININE: 0.92 mg/dL (ref 0.50–1.10)
Calcium: 9.3 mg/dL (ref 8.4–10.5)
Calcium: 9.6 mg/dL (ref 8.4–10.5)
Chloride: 89 mEq/L — ABNORMAL LOW (ref 96–112)
Chloride: 89 mEq/L — ABNORMAL LOW (ref 96–112)
Creatinine, Ser: 0.88 mg/dL (ref 0.50–1.10)
GFR calc Af Amer: 68 mL/min — ABNORMAL LOW (ref >90)
GFR, EST AFRICAN AMERICAN: 72 mL/min — AB (ref >90)
GFR, EST NON AFRICAN AMERICAN: 59 mL/min — AB (ref >90)
GFR, EST NON AFRICAN AMERICAN: 62 mL/min — AB (ref >90)
GLUCOSE: 62 mg/dL — AB (ref 70–99)
Glucose, Bld: 94 mg/dL (ref 70–99)
POTASSIUM: 4.2 meq/L (ref 3.7–5.3)
Potassium: 4.1 mEq/L (ref 3.7–5.3)
Sodium: 127 mEq/L — ABNORMAL LOW (ref 137–147)
Sodium: 128 mEq/L — ABNORMAL LOW (ref 137–147)

## 2014-08-09 LAB — CBC WITH DIFFERENTIAL/PLATELET
BASOS ABS: 0 10*3/uL (ref 0.0–0.1)
Basophils Relative: 0 % (ref 0–1)
EOS PCT: 1 % (ref 0–5)
Eosinophils Absolute: 0.1 10*3/uL (ref 0.0–0.7)
HCT: 27.2 % — ABNORMAL LOW (ref 36.0–46.0)
HEMOGLOBIN: 9.3 g/dL — AB (ref 12.0–15.0)
LYMPHS PCT: 63 % — AB (ref 12–46)
Lymphs Abs: 8.2 10*3/uL — ABNORMAL HIGH (ref 0.7–4.0)
MCH: 31 pg (ref 26.0–34.0)
MCHC: 34.2 g/dL (ref 30.0–36.0)
MCV: 90.7 fL (ref 78.0–100.0)
Monocytes Absolute: 0.7 10*3/uL (ref 0.1–1.0)
Monocytes Relative: 5 % (ref 3–12)
Neutro Abs: 4.1 10*3/uL (ref 1.7–7.7)
Neutrophils Relative %: 31 % — ABNORMAL LOW (ref 43–77)
Platelets: 203 10*3/uL (ref 150–400)
RBC: 3 MIL/uL — ABNORMAL LOW (ref 3.87–5.11)
RDW: 13.3 % (ref 11.5–15.5)
WBC: 13.1 10*3/uL — ABNORMAL HIGH (ref 4.0–10.5)

## 2014-08-09 LAB — LIPID PANEL
Cholesterol: 158 mg/dL (ref 0–200)
HDL: 93 mg/dL (ref >39)
LDL CALC: 58 mg/dL (ref 0–99)
Total CHOL/HDL Ratio: 1.7 RATIO
Triglycerides: 37 mg/dL (ref <150)
VLDL: 7 mg/dL (ref 0–40)

## 2014-08-09 LAB — GLUCOSE, CAPILLARY: Glucose-Capillary: 132 mg/dL — ABNORMAL HIGH (ref 70–99)

## 2014-08-09 MED ORDER — SODIUM CHLORIDE 1 G PO TABS
1.0000 g | ORAL_TABLET | Freq: Two times a day (BID) | ORAL | Status: DC
  Filled 2014-08-09 (×3): qty 1

## 2014-08-09 MED ORDER — LABETALOL HCL 200 MG PO TABS: 200.0000 mg | ORAL_TABLET | Freq: Two times a day (BID) | ORAL | Status: DC

## 2014-08-09 MED ORDER — LOSARTAN POTASSIUM 100 MG PO TABS: 100.0000 mg | ORAL_TABLET | Freq: Every morning | ORAL | Status: DC

## 2014-08-09 MED ORDER — SODIUM CHLORIDE 1 G PO TABS
1.0000 g | ORAL_TABLET | Freq: Two times a day (BID) | ORAL | Status: DC
  Administered 2014-08-09: 1 g via ORAL
  Filled 2014-08-09 (×2): qty 1

## 2014-08-09 MED ORDER — SODIUM CHLORIDE 1 G PO TABS: 1.0000 g | ORAL_TABLET | Freq: Two times a day (BID) | ORAL | Status: DC

## 2014-08-09 MED ORDER — LABETALOL HCL 200 MG PO TABS
200.0000 mg | ORAL_TABLET | Freq: Every day | ORAL | Status: DC
  Administered 2014-08-09: 200 mg via ORAL
  Filled 2014-08-09: qty 1

## 2014-08-09 NOTE — Progress Notes (Signed)
  Date: 08/09/2014  Patient name: Bynum BellowsDeborah E Parmer  Medical record number: [id number]  Date of birth: 06-17-1935   This patient has been seen and the plan of care was discussed with the house staff. Please see their note for complete details. I concur with their findings.  Inez CatalinaEmily B Dirk Vanaman, MD 08/09/2014, 12:04 PM

## 2014-08-09 NOTE — Discharge Summary (Signed)
Patient Name:  Madeline Jimenez  MRN: [id number]  PCP: Madeline Comment, FNP  DOB:  12/09/1934       Date of Admission:  08/07/2014  Date of Discharge:  08/10/2014      Attending Physician: Dr. Bonnetta Jimenez att. providers found         DISCHARGE DIAGNOSES: Principal Problem:   Hyponatremia Active Problems:   Lymphocytosis   Hypertension   Anemia   DISPOSITION AND FOLLOW-UP: Madeline Jimenez is to follow-up with the listed providers as detailed below, at patient's visiting, please address following issues:   Follow-up Information   Follow up with Madeline Comment, FNP In 1 week.   Specialty:  Nurse Practitioner   Contact information:   [address] [place] [address] [phone]      At follow-up, please address   1) Recheck Na 2) BP medication regimen 3) Referral to Heme/Onc for lymphocytosis and anemia 4) Alcohol use and diet      Discharge Instructions   Call MD for:  difficulty breathing, headache or visual disturbances    Complete by:  As directed      Call MD for:  extreme fatigue    Complete by:  As directed      Call MD for:  hives    Complete by:  As directed      Call MD for:  persistant dizziness or light-headedness    Complete by:  As directed      Call MD for:  persistant nausea and vomiting    Complete by:  As directed      Call MD for:  severe uncontrolled pain    Complete by:  As directed      Call MD for:  temperature >100.4    Complete by:  As directed      Diet - low sodium heart healthy    Complete by:  As directed      Increase activity slowly    Complete by:  As directed             DISCHARGE MEDICATIONS:   Medication List    STOP taking these medications       losartan-hydrochlorothiazide 100-12.5 MG per tablet  Commonly known as:  HYZAAR      TAKE these medications       labetalol 200 MG tablet  Commonly known as:  NORMODYNE  Take 1 tablet (200 mg total) by mouth 2 (two) times daily.     losartan 100 MG tablet    Commonly known as:  COZAAR  Take 1 tablet (100 mg total) by mouth every morning.     PROAIR HFA 108 (90 BASE) MCG/ACT inhaler  Generic drug:  albuterol  Inhale 1 puff into the lungs every 6 (six) hours as needed.     sodium chloride 1 G tablet  Take 1 tablet (1 g total) by mouth 2 (two) times daily with a meal.     SPIRIVA RESPIMAT 2.5 MCG/ACT Aers  Generic drug:  Tiotropium Bromide Monohydrate  Inhale 2.5 mcg into the lungs daily.     triamcinolone cream 0.1 %  Commonly known as:  KENALOG  Apply 1 application topically 2 (two) times daily as needed (for eczema).         CONSULTS:  Treatment Team:  Madeline Poag, MD    PROCEDURES PERFORMED:  Ct Head Wo Contrast  08/07/2014   CLINICAL DATA:  Loss of consciousness. Fell in kitchen and hit floor.  EXAM: CT HEAD WITHOUT CONTRAST  CT CERVICAL SPINE WITHOUT CONTRAST  TECHNIQUE: Multidetector CT imaging of the head and cervical spine was performed following the standard protocol without intravenous contrast. Multiplanar CT image reconstructions of the cervical spine were also generated.  COMPARISON:  CT scan of head of May 14, 2014.  FINDINGS: CT HEAD FINDINGS  Bony calvarium appears intact. Minimal diffuse cortical atrophy is noted. Mild chronic ischemic white matter disease is noted. No mass effect or midline shift is noted. Ventricular size is within normal limits. There is no evidence of mass lesion, hemorrhage or acute infarction.  CT CERVICAL SPINE FINDINGS  No fracture or spondylolisthesis is noted. Mild degenerative disc disease is noted at C4-5. Severe degenerative disc disease is noted at C5-6 and C6-7 with posterior osteophyte formation. Minimal hypertrophy of posterior facet joints is noted at multiple levels.  IMPRESSION: Minimal diffuse cortical atrophy. Mild chronic ischemic white matter disease. No acute intracranial abnormality seen.  Multilevel degenerative disc disease is noted. No acute abnormality seen in the cervical  spine.   Electronically Signed   By: Madeline Jimenez M.D.   On: 08/07/2014 13:18   Ct Cervical Spine Wo Contrast  08/07/2014   CLINICAL DATA:  Loss of consciousness. Fell in kitchen and hit floor.  EXAM: CT HEAD WITHOUT CONTRAST  CT CERVICAL SPINE WITHOUT CONTRAST  TECHNIQUE: Multidetector CT imaging of the head and cervical spine was performed following the standard protocol without intravenous contrast. Multiplanar CT image reconstructions of the cervical spine were also generated.  COMPARISON:  CT scan of head of May 14, 2014.  FINDINGS: CT HEAD FINDINGS  Bony calvarium appears intact. Minimal diffuse cortical atrophy is noted. Mild chronic ischemic white matter disease is noted. No mass effect or midline shift is noted. Ventricular size is within normal limits. There is no evidence of mass lesion, hemorrhage or acute infarction.  CT CERVICAL SPINE FINDINGS  No fracture or spondylolisthesis is noted. Mild degenerative disc disease is noted at C4-5. Severe degenerative disc disease is noted at C5-6 and C6-7 with posterior osteophyte formation. Minimal hypertrophy of posterior facet joints is noted at multiple levels.  IMPRESSION: Minimal diffuse cortical atrophy. Mild chronic ischemic white matter disease. No acute intracranial abnormality seen.  Multilevel degenerative disc disease is noted. No acute abnormality seen in the cervical spine.   Electronically Signed   By: Madeline Jimenez M.D.   On: 08/07/2014 13:18   Dg Chest Port 1 View  08/07/2014   CLINICAL DATA:  Patient experienced an episode of loss of consciousness today and fell in kitchen. Patient has a history of COPD and hypertension. Ex smoker.  EXAM: PORTABLE CHEST - 1 VIEW  COMPARISON:  05/14/2014.  FINDINGS: Cardiac silhouette is mildly enlarged. The aorta is tortuous. No mediastinal or hilar masses or convincing adenopathy.  Lungs are clear.  No pleural effusion or pneumothorax.  Bony thorax is demineralized. There are old left-sided healed rib  fractures, stable.  IMPRESSION: No acute cardiopulmonary disease.   Electronically Signed   By: Madeline Jimenez M.D.   On: 08/07/2014 12:54       ADMISSION DATA: H&P: Ms Madeline Jimenez is a 79 year old woman with COPD, HTN, NL, PVD who presents with fall. The following history was obtained from the patient. She was in her usual state of health this morning when she got up to fill a water bottle in her kitchen. She said that she lost her balance and fell and hit the stove. She says she  was awake the entire time with no prodromal symptoms (lightheadedness, dizziness, tunnel vision, feeling warm, etc). She denies losing continence of her bowel or bladder. She lives at home with her niece and her husband who called EMS who brought her here. She has not been sick recently. Her only pain is chronic back pain. She notes nocturia but denies dysuria. She has a chronic dry cough. She denies fever, chills, night sweats, diarrhea, constipation, shortness of breath, numbness, paresthesias. The patient had not eaten anything today. She had not drank today but says she normally drinks ~6 beers/day with laying of water.   I spoke to Niece Darl Pikes [phone]) on the phone. She told me that Ms Viger was in her usual state of health this morning when she went to get a glass of water and became dizzy and fell in the kitchen. The niece and her husband then helped her up and in to a chair. The patient then kept dropping alleve when it was handed to her. The patient then lost consciousness while sitting in the chair. While unconscious, her hands were slowly clenching into fists. She urinated and also had a bowel movement. She was unconscious for about one minute and confused when she woke up.   Patient was recently admitted by teaching service in 04/2014 for hyponatremia of 124 in setting of heat exhaustion.   In the ED, she began NS IVF infusion at 125 mL/hr.  Physical Exam: Gen: awake and alert, pleasant, in no acute distress    HEENT: Atraumatic, /AT, oropharynx moist, no tongue lesions noted  Heart: Regular rate and rhythm, normal S1 S2, no murmurs, rubs, or gallops  Lungs: Clear to auscultation bilaterally, respirations unlabored  Abd: Soft, non-tender, non-distended, + bowel sounds, no hepatosplenomegaly  Ext: No edema or cyanosis  Neuro: AAO x 3, responding appropriately, able to move extremities independently  Labs:  Basic Metabolic Panel:  Recent Labs Lab 08/09/14 0505 08/09/14 1150  NA 127* 128*  K 4.2 4.1  CL 89* 89*  CO2 29 31  GLUCOSE 94 62*  BUN 14 12  CREATININE 0.92 0.88  CALCIUM 9.3 9.6   Liver Function Tests:  Recent Labs Lab 08/07/14 1239 08/08/14 0647  AST 29 26  ALT 19 18  ALKPHOS 66 62  BILITOT 0.7 0.5  PROT 6.2 5.9*  ALBUMIN 3.8 3.4*   CBC:  Recent Labs Lab 08/07/14 1239 08/08/14 0647 08/09/14 0505  WBC 16.7* 15.0* 13.1*  NEUTROABS 6.3  --  4.1  HGB 10.2* 10.1* 9.3*  HCT 28.6* 28.5* 27.2*  MCV 86.9 88.5 90.7  PLT 209 204 203   Cardiac Enzymes:  Recent Labs Lab 08/07/14 1239  TROPONINI <0.30   CBG:  Recent Labs Lab 08/07/14 1321 08/08/14 1632 08/08/14 2004 08/09/14 0742 08/09/14 1158  GLUCAP 116* 152* 135* 132* 80   Fasting Lipid Panel:  Recent Labs Lab 08/09/14 0505  CHOL 158  HDL 93  LDLCALC 58  TRIG 37  CHOLHDL 1.7   Thyroid Function Tests:  Recent Labs Lab 08/07/14 2121  TSH 0.927   Coagulation:  Recent Labs Lab 08/07/14 1239  LABPROT 15.6*  INR 1.23   Anemia Panel:  Recent Labs Lab 08/07/14 1819  VITAMINB12 907  FOLATE >20.0  FERRITIN 186  TIBC 287  IRON 100  RETICCTPCT 1.5   Urinalysis:  Recent Labs Lab 08/07/14 1715  COLORURINE YELLOW  LABSPEC 1.007  PHURINE 6.5  GLUCOSEU NEGATIVE  HGBUR TRACE*  BILIRUBINUR NEGATIVE  KETONESUR NEGATIVE  PROTEINUR NEGATIVE  UROBILINOGEN 1.0  NITRITE NEGATIVE  LEUKOCYTESUR TRACE*   Misc. Labs: Serum osmolality 237  Urine osmolality 187  FeNa 0.3% on  presentation  Feurea 3.7% on presentation  HOSPITAL COURSE:  #Seizure in setting of Hyponatremia: While Ms Rolm BaptiseMcAskill describes a mechanical fall, her niece described that the patient then lost consciousness for ~ 1 minute, slowly clenched her hands, had bowel and bladder incontinence, and then was confused. Neurology was consulted who agreed this was likely a seizure in the setting of hyponatremia (sodium 116 on presentation). Given her history of extensive beer and water drinking, beer potomania likely. FeNa 0.3% is consistent with diganosis as is lower serum osmolality with attempt to concentrate urine. Renal was consulted and she was on NS IVF 125cc/hr which was discontinued when she reached a sodium of 122. Renal started NaCl tabs 2 g TID which appears to be working. Salt tabs were discontinued on 10/24 to halt overcorrection. We discussed that the patient should drink gatorade or ensure rather than pure water. We also discussed that she should stop or reduce drinking beer. Her sodium was stable at 128 at the time of discharge (she has chronic hyponatremia). Per neuro, there is no indication for anti-epileptic drug and there is no need for further neurologic testing unless she has recurrent seizure. She ambulated with PT yesterday and they feel she does not need additional follow-up. They recommend 24 hour assistance and she will return to live with her niece and nephew. We also discussed eating regular meals and/or adding Ensure which she had during her stay. She was discharged with 1g BID salt tablets for supplementation in the likely event that she continues consuming beer   #Lymphocyte Predominant Leukocytosis: Ms Rolm BaptiseMcAskill had a WBC 16.7 with 57% lymphocytes on admission now 13.1 on adission. She was afebrile without signs and symptoms of infection. This problem was noted on previous admission in 04/2014 when she had WBC 20.1 with predominant lymphocytes 54%. She was previously seen by Dr. Gaylyn RongHa (heme/onc)  in 04/2012 who thought this was 2/2 recurrent UTIs. Dr. Gaylyn RongHa had low suspicion for lymphoproliferative disorder at that time. She should follow-up with Dr. Gaylyn RongHa.  #Anemia: Ms Rolm BaptiseMcAskill was hemodynamically stable. Her anemia panel was normal. Corrected calcium is normal. Some of this may be dilutional as WBC has decreased as well. We recommend outpatient follow-up.  #COPD: No evidence of exacerbation. Oxygen saturation within normal limits on room air. I could not find PFTs in the patient's EMR. Ms Rolm BaptiseMcAskill uses 2 L O2 [place] when needed at home and uses Spiriva, Proair. Continue medications at discharge.   #HTN: She is currently prescribed losartan 100 mg- HCTZ 12.5 mg daily and labetalol 200 mg BID but only taking labetalol once at home. We advise her to continue losartan 100 mg daily and resume labetalol 200 mg - increase to appropriate BID dose at discretion of PCP. We recommend not resuming HCTZ at discharge due to hyponatremia   #Chronic Back Pain: This is a chronic issue. Degenerative disc disease on CT c spin so likely osteoarthritic throughout. During hospitalization she received acetaminophen 650 mg po q6hprn   DISCHARGE DATA: Vital Signs: BP 158/50  Pulse 66  Temp(Src) 98.2 F (36.8 C) (Oral)  Resp 18  Ht 5\' 4"  (1.626 m)  Wt 154 lb 12.8 oz (70.217 kg)  BMI 26.56 kg/m2  SpO2 97%  Services Ordered on Discharge: Y = Yes; Blank = No PT:   OT:   RN:   Equipment:   Other:  Time Spent on Discharge: 35 min   Signed: Farley Lyothman, Walterine Amodei PGY 1, Internal Medicine Resident 08/10/2014, 11:29 AM

## 2014-08-09 NOTE — Progress Notes (Signed)
Subjective: [name] was seen and examined this morning.  She slept well last night and has no complaints.  She has had no further episodes of LOC.  She ate breakfast this morning without N/V.  She has been drinking juices and says she likes the Ensure.  She is ambulating to bathroom without difficulty.  She feels ready to go home today.    Objective: Vital signs in last 24 hours: Filed Vitals:   08/08/14 1207 08/08/14 1348 08/08/14 2006 08/09/14 0611  BP: 161/49 137/58 117/41 170/58  Pulse: 78 79 66 66  Temp: 97.7 F (36.5 C) 98.5 F (36.9 C) 97.5 F (36.4 C) 98.2 F (36.8 C)  TempSrc: Oral Oral Oral Oral  Resp: 14 18    Height:      Weight:    70.217 kg (154 lb 12.8 oz)  SpO2: 96% 95% 99% 97%   Weight change: -0.083 kg (-2.9 oz)  Intake/Output Summary (Last 24 hours) at 08/09/14 0723 Last data filed at 08/08/14 2204  Gross per 24 hour  Intake   1226 ml  Output    750 ml  Net    476 ml   Gen: awake and alert, pleasant, in no acute distress  HEENT: Atraumatic, [place]/AT, oropharynx moist, no tongue lesions noted Heart: Regular rate and rhythm, normal S1 S2, no murmurs, rubs, or gallops  Lungs: Clear to auscultation bilaterally, respirations unlabored  Abd: Soft, non-tender, non-distended, + bowel sounds, no hepatosplenomegaly  Ext: No edema or cyanosis Neuro: AAO x 3, responding appropriately, able to move extremities independently  Lab Results: Basic Metabolic Panel:  Recent Labs Lab 08/08/14 1930 08/09/14 0505  NA 127* 127*  K 4.2 4.2  CL 88* 89*  CO2 30 29  GLUCOSE 141* 94  BUN 16 14  CREATININE 0.95 0.92  CALCIUM 9.6 9.3   Liver Function Tests:  Recent Labs Lab 08/07/14 1239 08/08/14 0647  AST 29 26  ALT 19 18  ALKPHOS 66 62  BILITOT 0.7 0.5  PROT 6.2 5.9*  ALBUMIN 3.8 3.4*   CBC:  Recent Labs Lab 08/07/14 1239 08/08/14 0647 08/09/14 0505  WBC 16.7* 15.0* 13.1*  NEUTROABS 6.3  --  PENDING  HGB 10.2* 10.1* 9.3*  HCT 28.6* 28.5* 27.2*    MCV 86.9 88.5 90.7  PLT 209 204 203   Cardiac Enzymes:  Recent Labs Lab 08/07/14 1239  TROPONINI <0.30  CBG:  Recent Labs Lab 08/07/14 1321 08/08/14 1632 08/08/14 2004  GLUCAP 116* 152* 135*   Thyroid Function Tests:  Recent Labs Lab 08/07/14 2121  TSH 0.927   Coagulation:  Recent Labs Lab 08/07/14 1239  LABPROT 15.6*  INR 1.23   Anemia Panel:  Recent Labs Lab 08/07/14 1819  VITAMINB12 907  FOLATE >20.0  FERRITIN 186  TIBC 287  IRON 100  RETICCTPCT 1.5   Urinalysis:  Recent Labs Lab 08/07/14 1715  COLORURINE YELLOW  LABSPEC 1.007  PHURINE 6.5  GLUCOSEU NEGATIVE  HGBUR TRACE*  BILIRUBINUR NEGATIVE  KETONESUR NEGATIVE  PROTEINUR NEGATIVE  UROBILINOGEN 1.0  NITRITE NEGATIVE  LEUKOCYTESUR TRACE*   Misc. Labs: Serum osmolality 237 Urine osmolality 187 FeNa 0.3% on presentation Feurea 3.7% on presentation  Micro Results: Recent Results (from the past 240 hour(s))  MRSA PCR SCREENING     Status: Abnormal   Collection Time    08/07/14  8:09 PM      Result Value Ref Range Status   MRSA by PCR POSITIVE (*) NEGATIVE Final   Comment:  The GeneXpert MRSA Assay (FDA     approved for NASAL specimens     only), is one component of a     comprehensive MRSA colonization     surveillance program. It is not     intended to diagnose MRSA     infection nor to guide or     monitor treatment for     MRSA infections.     RESULT CALLED TO, READ BACK BY AND VERIFIED WITH:     Franchot GalloM ANINON RN [id number] 08/07/14 A BROWNING   Studies/Results: Ct Head Wo Contrast  08/07/2014   CLINICAL DATA:  Loss of consciousness. Fell in kitchen and hit floor.  EXAM: CT HEAD WITHOUT CONTRAST  CT CERVICAL SPINE WITHOUT CONTRAST  TECHNIQUE: Multidetector CT imaging of the head and cervical spine was performed following the standard protocol without intravenous contrast. Multiplanar CT image reconstructions of the cervical spine were also generated.  COMPARISON:  CT scan  of head of May 14, 2014.  FINDINGS: CT HEAD FINDINGS  Bony calvarium appears intact. Minimal diffuse cortical atrophy is noted. Mild chronic ischemic white matter disease is noted. No mass effect or midline shift is noted. Ventricular size is within normal limits. There is no evidence of mass lesion, hemorrhage or acute infarction.  CT CERVICAL SPINE FINDINGS  No fracture or spondylolisthesis is noted. Mild degenerative disc disease is noted at C4-5. Severe degenerative disc disease is noted at C5-6 and C6-7 with posterior osteophyte formation. Minimal hypertrophy of posterior facet joints is noted at multiple levels.  IMPRESSION: Minimal diffuse cortical atrophy. Mild chronic ischemic white matter disease. No acute intracranial abnormality seen.  Multilevel degenerative disc disease is noted. No acute abnormality seen in the cervical spine.   Electronically Signed   By: Roque LiasJames  Green M.D.   On: 08/07/2014 13:18   Ct Cervical Spine Wo Contrast  08/07/2014   CLINICAL DATA:  Loss of consciousness. Fell in kitchen and hit floor.  EXAM: CT HEAD WITHOUT CONTRAST  CT CERVICAL SPINE WITHOUT CONTRAST  TECHNIQUE: Multidetector CT imaging of the head and cervical spine was performed following the standard protocol without intravenous contrast. Multiplanar CT image reconstructions of the cervical spine were also generated.  COMPARISON:  CT scan of head of May 14, 2014.  FINDINGS: CT HEAD FINDINGS  Bony calvarium appears intact. Minimal diffuse cortical atrophy is noted. Mild chronic ischemic white matter disease is noted. No mass effect or midline shift is noted. Ventricular size is within normal limits. There is no evidence of mass lesion, hemorrhage or acute infarction.  CT CERVICAL SPINE FINDINGS  No fracture or spondylolisthesis is noted. Mild degenerative disc disease is noted at C4-5. Severe degenerative disc disease is noted at C5-6 and C6-7 with posterior osteophyte formation. Minimal hypertrophy of posterior  facet joints is noted at multiple levels.  IMPRESSION: Minimal diffuse cortical atrophy. Mild chronic ischemic white matter disease. No acute intracranial abnormality seen.  Multilevel degenerative disc disease is noted. No acute abnormality seen in the cervical spine.   Electronically Signed   By: Roque LiasJames  Green M.D.   On: 08/07/2014 13:18   Dg Chest Port 1 View  08/07/2014   CLINICAL DATA:  Patient experienced an episode of loss of consciousness today and fell in kitchen. Patient has a history of COPD and hypertension. Ex smoker.  EXAM: PORTABLE CHEST - 1 VIEW  COMPARISON:  05/14/2014.  FINDINGS: Cardiac silhouette is mildly enlarged. The aorta is tortuous. No mediastinal or hilar masses or convincing adenopathy.  Lungs are clear.  No pleural effusion or pneumothorax.  Bony thorax is demineralized. There are old left-sided healed rib fractures, stable.  IMPRESSION: No acute cardiopulmonary disease.   Electronically Signed   By: Amie Portland M.D.   On: 08/07/2014 12:54   Medications: I have reviewed the patient's current medications. Scheduled Meds: . Chlorhexidine Gluconate Cloth  6 each Topical Q0600  . feeding supplement (ENSURE COMPLETE)  237 mL Oral Q24H  . folic acid  1 mg Oral Daily  . heparin  5,000 Units Subcutaneous 3 times per day  . losartan  100 mg Oral Daily  . multivitamin with minerals  1 tablet Oral Daily  . mupirocin ointment  1 application Nasal BID  . sodium chloride  3 mL Intravenous Q12H  . thiamine  100 mg Oral Daily   Or  . thiamine  100 mg Intravenous Daily  . tiotropium  18 mcg Inhalation Daily   Continuous Infusions:  PRN Meds:.acetaminophen, acetaminophen, albuterol, LORazepam, LORazepam, ondansetron (ZOFRAN) IV, triamcinolone cream Assessment/Plan:  #Seizure in setting of Hyponatremia: She has responded to NS, increased meals, salt tabs and free water restriction.  Salt tabs and NS were discontinued yesterday to avoid overly rapid correction.  Na this morning is  127.  She feels better and has been asymptomatic since admission.  I appreciate neurology and nephrology assistance with this patient.  This was likely a seizure provoked by hyponatremia.  Per neuro, there is no indication for anti-epileptic drug and there is no need for further neurologic testing unless she has recurrent seizure.  She ambulated with PT yesterday and they feel she does not need additional follow-up.  They recommend 24 hour assistance and she will return to live with her niece and nephew.  - will obtain BMP today to ensure increasing Na - she is stable for d/c home; I have advised her to decrease beer and excess free water consumption; she was advised to try and eat regular meals (she eats one meal per day) and/or add Ensure, she is agreeable to this. - will prescribe 1g BID salt tablets at discharge for supplementation in the likely event that she continues consuming beer  #Lymphocyte Predominant Leukocytosis: WBC 16.7 with 57% lymphocytes on admission now 13.1. She is afebrile without signs and symptoms of infection. Noted on previous admission in 04/2014 when she had WBC 20.1 w/predominant lymphocytes 54%. She was previously seen by Dr. Gaylyn Rong (heme/onc) in 04/2012 who thought this was 2/2 recurrent UTIs.  Dr. Gaylyn Rong had low suspicion for lymphoproliferative disorder at that time.   - will refer patient back to Dr. Gaylyn Rong (heme-onc)   #Anemia: Hemodynamically stable. Anemia panel normal.  Corrected calcium is normal.  Some of this may be dilutional as WBC has decreased as well. - recommend outpatient follow-up; I will refer back to Heme/onc for leukocytosis and anemia  #COPD: No evidence of exacerbation.  SpO2 97% on room air.  I could not find PFTs in the patient's EMR. Ms Chhim uses 2 L O2 [place] when needed at home and uses Spiriva, Proair.  Continue medications at discharge.   #HTN: SBP 170 this AM. She currently prescribed losartan 100 mg- HCTZ 12.5 mg daily and labetalol 200 mg BID but only  taking labetalol once at home.  - cont losartan 100 mg daily and resume labetalol 200 mg - increase to appropriate BID dose - do not resume HCTZ at discharge due to hyponatremia  #Chronic Back Pain: Chronic issue. Degenerative disc disease on CT c  spin so likely osteoarthritic throughout.  -acetaminophen 650 mg po q6hprn   #Diet: regular   #VTE PPx: heparin 5000 u [place] tid, SCDs   Dispo: She is stable for d/c home and should follow-up with her PCP next week.   She has been given instruction on the importance of limiting beer/excess free water and eating regular meals/drinking Ensure.  The patient does have a current PCP Marin Comment, FNP) and does need an [facility] hospital follow-up appointment after discharge.  The patient does not know have transportation limitations that hinder transportation to clinic appointments.  .Services Needed at time of discharge: Y = Yes, Blank = No PT:   OT:   RN:   Equipment:   Other:     LOS: 2 days   Madeline Manges, DO 08/09/2014, 7:23 AM

## 2014-08-09 NOTE — Discharge Instructions (Addendum)
1. Please see your primary care provider in the next week and have your sodium level checked.   2. Please take all medications as prescribed.   I have prescribed salt tablets for you to help keep your salt level from falling low.  Take 1 tablet two times per day.  Take the tablet with a meal.  Try to cut back on the amount of beer your drink because beer will lower your salt level.  Also, try to cut back on drinking too much water.  Try to eat regular meals and you can drink Ensure as well.   3. If you have worsening of your symptoms or new symptoms arise, please call the clinic [phone]), or go to the ER immediately if symptoms are severe.   Hyponatremia  Hyponatremia is when the salt (sodium) in your blood is low. When salt becomes low, your cells take in extra water and puff up (swell). The puffiness can happen in the whole body. It mostly affects the brain and is very serious.  HOME CARE  Only take medicine as told by your doctor.  Follow any diet instructions you were given. This includes limiting how much fluid you drink.  Keep all doctor visits for tests as told.  Avoid alcohol and drugs. GET HELP RIGHT AWAY IF:  You start to twitch and shake (seize).  You pass out (faint).  You continue to have watery poop (diarrhea) or you throw up (vomit).  You feel sick to your stomach (nauseous).  You are tired (fatigued), have a headache, are confused, or feel weak.  Your problems that first brought you to the doctor come back.  You have trouble following your diet instructions. MAKE SURE YOU:   Understand these instructions.  Will watch your condition.  Will get help right away if you are not doing well or get worse. Document Released: 06/14/2011 Document Revised: 12/25/2011 Document Reviewed: 06/14/2011 [facility] LLP[facility] Patient Information 2015 [place], [place]. This information is not intended to replace advice given to you by your health care provider. Make sure you discuss any  questions you have with your health care provider.

## 2014-08-10 LAB — GLUCOSE, CAPILLARY: Glucose-Capillary: 80 mg/dL (ref 70–99)

## 2014-08-10 NOTE — Discharge Summary (Signed)
INTERNAL MEDICINE ATTENDING DISCHARGE COSIGN   I discussed the discharge plan with my resident team. I agree with the discharge documentation and disposition.   Tarig Zimmers 08/10/2014, 2:39 PM

## 2014-08-11 NOTE — Progress Notes (Signed)
UR Completed Mattie Novosel Graves-Bigelow, RN,BSN [phone]  

## 2014-09-08 ENCOUNTER — Other Ambulatory Visit: Payer: Self-pay | Admitting: Internal Medicine

## 2015-10-30 IMAGING — CR DG CHEST 2V
2 series · 2 of 2 positions shown · non-contrast
Comparison: None.

CLINICAL DATA: Altered mental status.

EXAM:
CHEST  2 VIEW

[w chest lat]
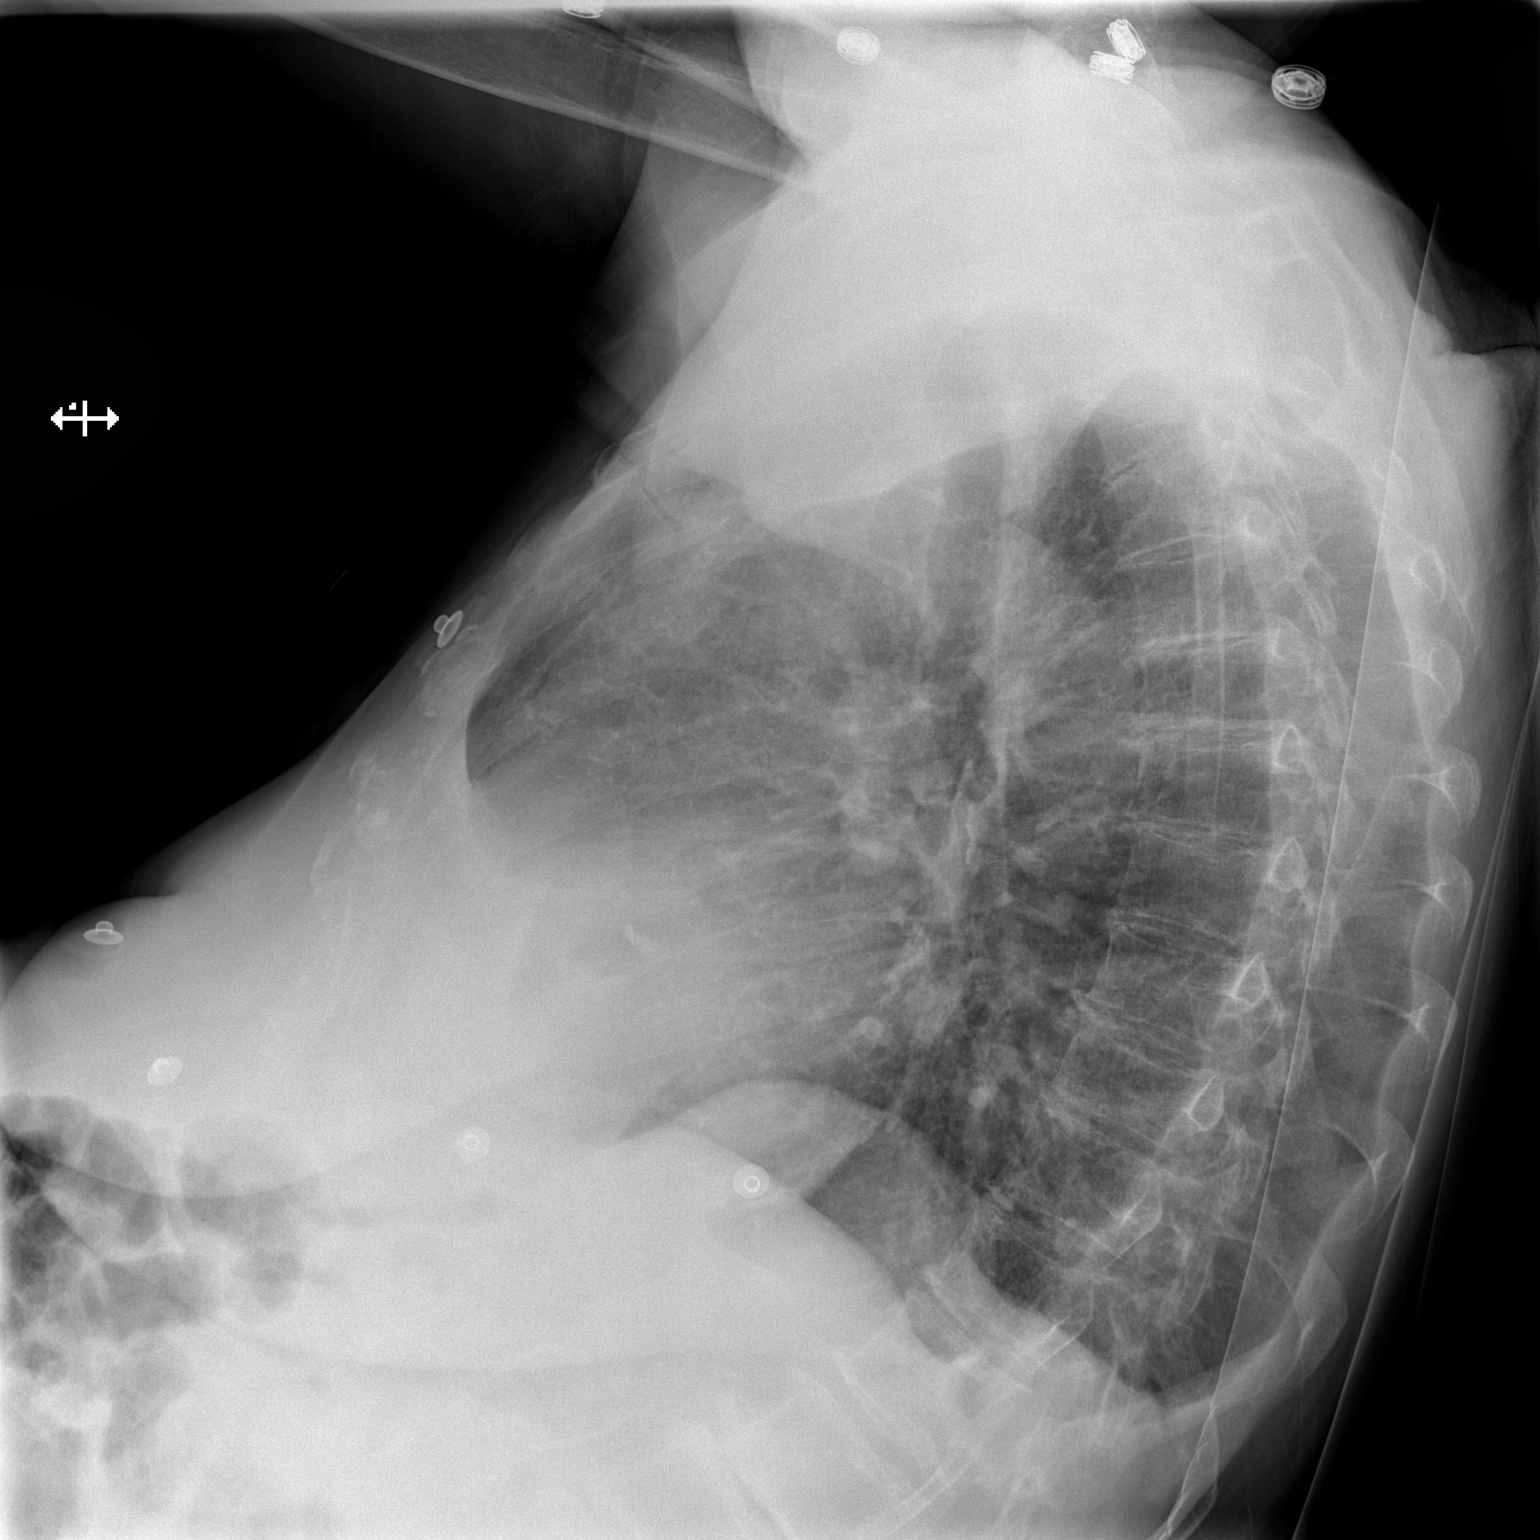

[x chest ap]
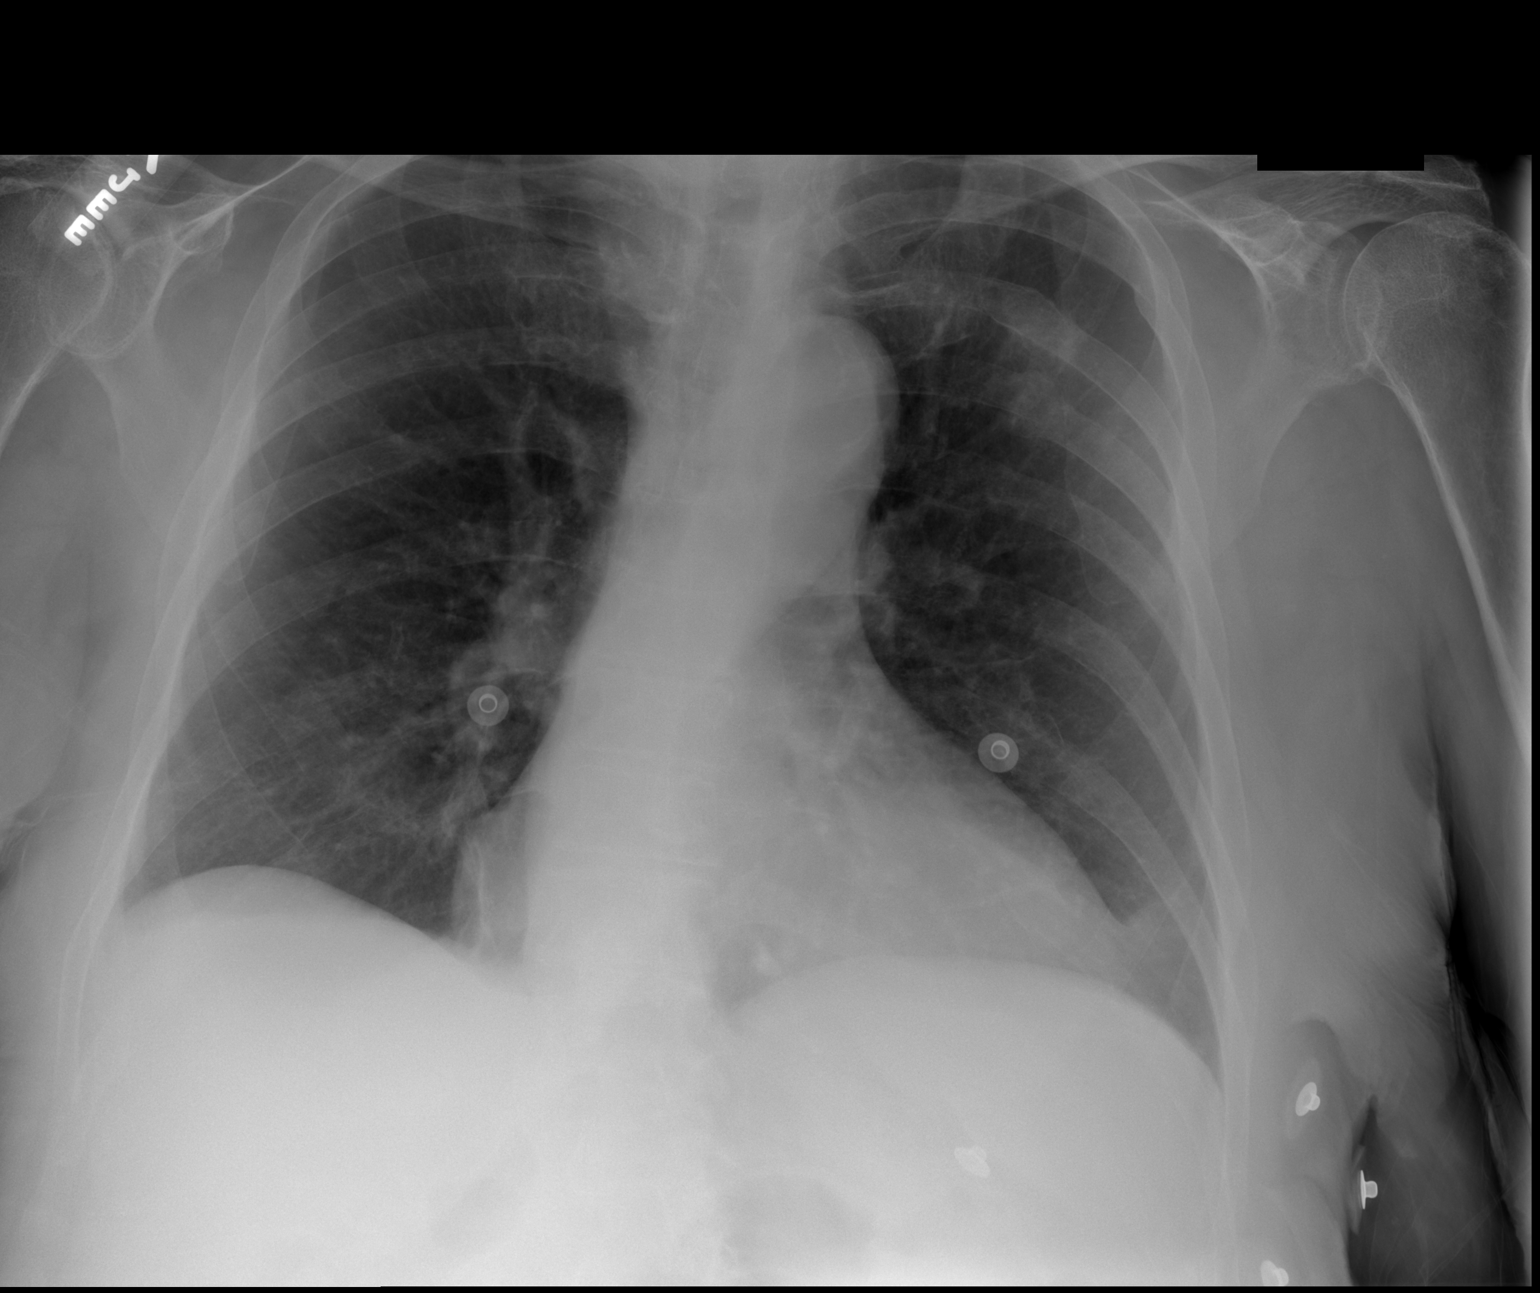

[2 of 2 positions shown; findings below may reference images not displayed]

FINDINGS: Mediastinum and hilar structures are normal. Cardiomegaly with
normal pulmonary vascularity. Mild basilar atelectasis. Multiple
left rib fractures are noted. Degenerative changes thoracic spine.
IMPRESSION: 1. Mild basilar atelectasis.
2. Cardiomegaly, normal pulmonary vascularity.
3. Old left rib fractures.

## 2019-07-08 ENCOUNTER — Ambulatory Visit
Admission: EM | Admit: 2019-07-08 | Discharge: 2019-07-08 | Disposition: A | Discharge status: Home / Self Care | Payer: Medicare Other

## 2019-07-08 ENCOUNTER — Other Ambulatory Visit: Payer: Self-pay

## 2019-07-08 DIAGNOSIS — H00014 Hordeolum externum left upper eyelid: Secondary | ICD-10-CM

## 2019-07-08 NOTE — ED Triage Notes (Signed)
Pt c/o lt eye swelling, redness and drainage for a week and getting worse

## 2019-07-08 NOTE — Discharge Instructions (Addendum)
84 year old female accompanied by her niece due to difficulty hearing presenting for 1 week course of left upper eyelid pain, swelling, redness without trauma/foreign body exposure.  Have tried hot compresses without relief.  Now having purulent discharge and decreased vision a second to discharge and obstruction from eyelid swelling.  EOMs are intact, patient denies change in vision.  Referred to ophthalmology for possible I&D and further surveillance/management.

## 2019-07-08 NOTE — ED Provider Notes (Signed)
[facility]    CSN: [id number] Arrival date & time: 07/08/19  1350      History   Chief Complaint Chief Complaint  Patient presents with  . Eye Drainage    HPI Loria Lacina is a 84 y.o. female presenting with her niece for left upper eyelid pain, swelling, redness for the last week.  States they have tried using hot compresses at significant relief.  Patient is hard of hearing, niece helps provide history: Denies foreign body sensation, exposure, decreased vision, globe pain, trauma to the area.  Does not wear contacts, use mascara.  Past Medical History:  Diagnosis Date  . COPD (chronic obstructive pulmonary disease) (HCC)   . HTN (hypertension)   . Hyperlipidemia   . Hyponatremia   . Lymphocytosis   . Peripheral vascular disease (HCC)   . Recurrent UTI     Patient Active Problem List   Diagnosis Date Noted  . Anemia 08/09/2014  . Hyponatremia 05/14/2014  . Leukocytosis 05/14/2014  . Hypertension 05/14/2014  . Heat exhaustion 05/14/2014  . Lymphocytosis     Past Surgical History:  Procedure Laterality Date  . ABDOMINAL HYSTERECTOMY     for fibroid  . TONSILECTOMY, ADENOIDECTOMY, BILATERAL MYRINGOTOMY AND TUBES      OB History   No obstetric history on file.      Home Medications    Prior to Admission medications   Medication Sig Start Date End Date Taking? Authorizing Provider  albuterol (ACCUNEB) 0.63 MG/3ML nebulizer solution Take 1 ampule by nebulization every 6 (six) hours as needed for wheezing.   Yes [provider]  labetalol (NORMODYNE) 200 MG tablet Take 1 tablet (200 mg total) by mouth 2 (two) times daily. 08/09/14   Yolanda Manges, DO  losartan (COZAAR) 100 MG tablet Take 1 tablet (100 mg total) by mouth every morning. 08/09/14   Yolanda Manges, DO  PROAIR HFA 108 (90 BASE) MCG/ACT inhaler Inhale 1 puff into the lungs every 6 (six) hours as needed. 04/29/14   [provider]  sodium chloride 1 G tablet Take 1  tablet (1 g total) by mouth 2 (two) times daily with a meal. 08/09/14   Yolanda Manges, DO  Tiotropium Bromide Monohydrate (SPIRIVA RESPIMAT) 2.5 MCG/ACT AERS Inhale 2.5 mcg into the lungs daily.    [provider]  triamcinolone cream (KENALOG) 0.1 % Apply 1 application topically 2 (two) times daily as needed (for eczema).  05/13/14   [provider]    Family History Family History  Problem Relation Age of Onset  . Hypertension Father   . Heart attack Father   . Heart attack Sister     Social History Social History   Tobacco Use  . Smoking status: Former Smoker    Packs/day: 2.00    Years: 50.00    Pack years: 100.00  . Smokeless tobacco: Never Used  Substance Use Topics  . Alcohol use: No  . Drug use: No     Allergies   Patient has no known allergies.   Review of Systems Review of Systems  Constitutional: Negative for activity change, appetite change, fatigue and fever.  HENT: Negative for ear pain, sinus pain, sore throat and voice change.   Eyes: Positive for pain, discharge and redness. Negative for photophobia, itching and visual disturbance.  Respiratory: Negative for cough and shortness of breath.   Cardiovascular: Negative for chest pain and palpitations.  Gastrointestinal: Negative for abdominal pain, diarrhea and vomiting.  Musculoskeletal: Negative  for arthralgias and myalgias.  Skin: Negative for rash and wound.  Neurological: Negative for syncope and headaches.     Physical Exam Triage Vital Signs ED Triage Vitals  Enc Vitals Group     BP 07/08/19 1404 121/70     Pulse Rate 07/08/19 1404 66     Resp 07/08/19 1404 18     Temp 07/08/19 1404 98.2 F (36.8 C)     Temp Source 07/08/19 1404 Oral     SpO2 07/08/19 1404 90 %     Weight --      Height --      Head Circumference --      Peak Flow --      Pain Score 07/08/19 1405 9     Pain Loc --      Pain Edu? --      Excl. in GC? --    No data found.  Updated Vital Signs BP  121/70 (BP Location: Left Arm)   Pulse 66   Temp 98.2 F (36.8 C) (Oral)   Resp 18   SpO2 90%    Physical Exam Constitutional:      General: She is not in acute distress.    Appearance: She is not ill-appearing.  HENT:     Head: Normocephalic and atraumatic.     Jaw: There is normal jaw occlusion. No tenderness or pain on movement.     Right Ear: Hearing, tympanic membrane, ear canal and external ear normal. No tenderness. No mastoid tenderness.     Left Ear: Hearing, tympanic membrane, ear canal and external ear normal. No tenderness. No mastoid tenderness.     Nose: No nasal deformity, septal deviation or nasal tenderness.     Right Turbinates: Not swollen or pale.     Left Turbinates: Not swollen or pale.     Right Sinus: No maxillary sinus tenderness or frontal sinus tenderness.     Left Sinus: No maxillary sinus tenderness or frontal sinus tenderness.     Mouth/Throat:     Lips: Pink. No lesions.     Mouth: Mucous membranes are moist. No injury.     Pharynx: Oropharynx is clear. Uvula midline. No posterior oropharyngeal erythema or uvula swelling.     Comments: no tonsillar exudate or hypertrophy Eyes:     General: No scleral icterus.       Right eye: No discharge.        Left eye: Discharge present.    Extraocular Movements: Extraocular movements intact.     Pupils: Pupils are equal, round, and reactive to light.     Comments: Left upper eyelid with significant edema, erythema, warmth, tenderness to palpation.  Copious purulent discharge with moderate conjunctival injection.  Neck:     Musculoskeletal: Normal range of motion and neck supple. No muscular tenderness.  Cardiovascular:     Rate and Rhythm: Normal rate.  Pulmonary:     Effort: Pulmonary effort is normal.  Lymphadenopathy:     Cervical: No cervical adenopathy.  Neurological:     Mental Status: She is alert and oriented to person, place, and time.      UC Treatments / Results  Labs (all labs ordered  are listed, but only abnormal results are displayed) Labs Reviewed - No data to display  EKG   Radiology No results found.  Procedures Procedures (including critical care time)  Medications Ordered in UC Medications - No data to display  Initial Impression / Assessment and Plan / UC Course  I have reviewed the triage vital signs and the nursing notes.  Pertinent labs & imaging results that were available during my care of the patient were reviewed by me and considered in my medical decision making (see chart for details).     1.  Hordeolum of left upper eyelid This provider coordinated care: Patient to have appointment with ophthalmology at 3:15 this afternoon.  Relayed appointment date/time/location and office phone number to patient and her niece who verbalized understanding, intent to keep this appointment.  Patient discharged in stable condition.  Return precautions discussed, patient verbalized understanding and is agreeable to plan. Final Clinical Impressions(s) / UC Diagnoses   Final diagnoses:  Hordeolum of left upper eyelid, unspecified hordeolum type   Discharge Instructions   None    ED Prescriptions    None     PDMP not reviewed this encounter.   Hall-Potvin, [place], [place] 07/08/19 1438

## 2020-02-16 ENCOUNTER — Other Ambulatory Visit: Payer: Self-pay | Admitting: Family

## 2020-02-16 DIAGNOSIS — M810 Age-related osteoporosis without current pathological fracture: Secondary | ICD-10-CM

## 2020-02-23 ENCOUNTER — Telehealth: Payer: Self-pay | Admitting: *Deleted

## 2020-02-23 NOTE — Telephone Encounter (Signed)
Received referral for low dose lung cancer screening CT scan. Message left at phone number listed in EMR for patient to call me back to facilitate scheduling scan.  

## 2020-03-03 ENCOUNTER — Telehealth: Payer: Self-pay | Admitting: *Deleted

## 2020-03-03 NOTE — Telephone Encounter (Signed)
Contacted patient's caregiver and niece. Discussed referral for lung screening scan. Made family member aware that due to age >33, patient is not eligible for lung cancer screening. Discussed that patient may be eligible for a CT scan of the chest for COPD or other diagnostic purposes. Verbalized understanding.

## 2020-12-21 ENCOUNTER — Other Ambulatory Visit: Payer: Self-pay | Admitting: Internal Medicine

## 2020-12-21 DIAGNOSIS — Z13820 Encounter for screening for osteoporosis: Secondary | ICD-10-CM

## 2021-08-20 ENCOUNTER — Emergency Department (HOSPITAL_COMMUNITY): Payer: Medicare Other

## 2021-08-20 ENCOUNTER — Other Ambulatory Visit: Payer: Self-pay

## 2021-08-20 ENCOUNTER — Emergency Department (HOSPITAL_COMMUNITY)
Admission: EM | Admit: 2021-08-20 | Discharge: 2021-08-21 | Disposition: A | Discharge status: Home / Self Care | Payer: Medicare Other | Attending: Emergency Medicine | Admitting: Emergency Medicine

## 2021-08-20 DIAGNOSIS — I10 Essential (primary) hypertension: Secondary | ICD-10-CM | POA: Diagnosis not present

## 2021-08-20 DIAGNOSIS — J449 Chronic obstructive pulmonary disease, unspecified: Secondary | ICD-10-CM | POA: Diagnosis not present

## 2021-08-20 DIAGNOSIS — W1830XA Fall on same level, unspecified, initial encounter: Secondary | ICD-10-CM | POA: Insufficient documentation

## 2021-08-20 DIAGNOSIS — Y92002 Bathroom of unspecified non-institutional (private) residence single-family (private) house as the place of occurrence of the external cause: Secondary | ICD-10-CM | POA: Insufficient documentation

## 2021-08-20 DIAGNOSIS — Z7951 Long term (current) use of inhaled steroids: Secondary | ICD-10-CM | POA: Diagnosis not present

## 2021-08-20 DIAGNOSIS — R296 Repeated falls: Secondary | ICD-10-CM

## 2021-08-20 DIAGNOSIS — S92321A Displaced fracture of second metatarsal bone, right foot, initial encounter for closed fracture: Secondary | ICD-10-CM

## 2021-08-20 DIAGNOSIS — Z79899 Other long term (current) drug therapy: Secondary | ICD-10-CM | POA: Insufficient documentation

## 2021-08-20 DIAGNOSIS — Z87891 Personal history of nicotine dependence: Secondary | ICD-10-CM | POA: Insufficient documentation

## 2021-08-20 DIAGNOSIS — S99921A Unspecified injury of right foot, initial encounter: Secondary | ICD-10-CM | POA: Diagnosis present

## 2021-08-20 DIAGNOSIS — E860 Dehydration: Secondary | ICD-10-CM

## 2021-08-20 LAB — CBC
HCT: 33 % — ABNORMAL LOW (ref 36.0–46.0)
Hemoglobin: 10.8 g/dL — ABNORMAL LOW (ref 12.0–15.0)
MCH: 31 pg (ref 26.0–34.0)
MCHC: 32.7 g/dL (ref 30.0–36.0)
MCV: 94.8 fL (ref 80.0–100.0)
Platelets: 142 10*3/uL — ABNORMAL LOW (ref 150–400)
RBC: 3.48 MIL/uL — ABNORMAL LOW (ref 3.87–5.11)
RDW: 14 % (ref 11.5–15.5)
WBC: 11.3 10*3/uL — ABNORMAL HIGH (ref 4.0–10.5)
nRBC: 0 % (ref 0.0–0.2)

## 2021-08-20 LAB — BASIC METABOLIC PANEL
Anion gap: 9 (ref 5–15)
BUN: 25 mg/dL — ABNORMAL HIGH (ref 8–23)
CO2: 26 mmol/L (ref 22–32)
Calcium: 9.3 mg/dL (ref 8.9–10.3)
Chloride: 100 mmol/L (ref 98–111)
Creatinine, Ser: 1.66 mg/dL — ABNORMAL HIGH (ref 0.44–1.00)
GFR, Estimated: 30 mL/min — ABNORMAL LOW (ref >60)
Glucose, Bld: 135 mg/dL — ABNORMAL HIGH (ref 70–99)
Potassium: 4 mmol/L (ref 3.5–5.1)
Sodium: 135 mmol/L (ref 135–145)

## 2021-08-20 MED ORDER — SODIUM CHLORIDE 0.9 % IV BOLUS
1000.0000 mL | Freq: Once | INTRAVENOUS | Status: AC
  Administered 2021-08-21: 1000 mL via INTRAVENOUS

## 2021-08-20 MED ORDER — HYDROCODONE-ACETAMINOPHEN 5-325 MG PO TABS: 1.0000 | ORAL_TABLET | Freq: Four times a day (QID) | ORAL | 0 refills | Status: DC | PRN

## 2021-08-20 MED ORDER — MORPHINE SULFATE (PF) 4 MG/ML IV SOLN
4.0000 mg | Freq: Once | INTRAVENOUS | Status: AC
  Administered 2021-08-20: 4 mg via INTRAVENOUS
  Filled 2021-08-20: qty 1

## 2021-08-20 NOTE — ED Provider Notes (Signed)
[facility] EMERGENCY DEPARTMENT Provider Note   CSN: [id number] Arrival date & time: 08/20/21  2148     History Chief Complaint  Patient presents with   Madeline Jimenez is a 86 y.o. female.   Fall  This patient is an 86 year old female, she has a history of COPD hypertension hyperlipidemia and has an intermittent history of hyponatremia as well.  She presents to the hospital today with a complaint of frequent falls, evidently the patient has had several falls today where she states that she loses her balance.  1 of these occurred while she was in the bathroom, she had some diarrhea on her leg, she was not able to clean it up when she fell injuring her right foot which caused pain and bruising.  She was brought into the hospital by ambulance this evening.  Symptoms are persistent, worse with range of motion of the right foot, not associated with head injury upper extremity injury chest pain, shortness of breath, abdominal pain or vomiting.  She has no fevers or chills.  The son is at the bedside at this time and states that he knows that she has bad balance and falls not infrequently but has had several falls today.  She lives with her nephew    Past Medical History:  Diagnosis Date   COPD (chronic obstructive pulmonary disease) (HCC)    HTN (hypertension)    Hyperlipidemia    Hyponatremia    Lymphocytosis    Peripheral vascular disease (HCC)    Recurrent UTI     Patient Active Problem List   Diagnosis Date Noted   Anemia 08/09/2014   Hyponatremia 05/14/2014   Leukocytosis 05/14/2014   Hypertension 05/14/2014   Heat exhaustion 05/14/2014   Lymphocytosis     Past Surgical History:  Procedure Laterality Date   ABDOMINAL HYSTERECTOMY     for fibroid   TONSILECTOMY, ADENOIDECTOMY, BILATERAL MYRINGOTOMY AND TUBES       OB History   No obstetric history on file.     Family History  Problem Relation Age of Onset   Hypertension Father     Heart attack Father    Heart attack Sister     Social History   Tobacco Use   Smoking status: Former    Packs/day: 2.00    Years: 50.00    Pack years: 100.00    Types: Cigarettes   Smokeless tobacco: Never  Substance Use Topics   Alcohol use: No   Drug use: No    Home Medications Prior to Admission medications   Medication Sig Start Date End Date Taking? Authorizing Provider  HYDROcodone-acetaminophen (NORCO/VICODIN) 5-325 MG tablet Take 1 tablet by mouth every 6 (six) hours as needed for moderate pain. 08/20/21  Yes Eber Hong, MD  albuterol (ACCUNEB) 0.63 MG/3ML nebulizer solution Take 1 ampule by nebulization every 6 (six) hours as needed for wheezing.    [provider]  labetalol (NORMODYNE) 200 MG tablet Take 1 tablet (200 mg total) by mouth 2 (two) times daily. 08/09/14   Yolanda Manges, DO  losartan (COZAAR) 100 MG tablet Take 1 tablet (100 mg total) by mouth every morning. 08/09/14   Yolanda Manges, DO  PROAIR HFA 108 (90 BASE) MCG/ACT inhaler Inhale 1 puff into the lungs every 6 (six) hours as needed. 04/29/14   [provider]  sodium chloride 1 G tablet Take 1 tablet (1 g total) by mouth 2 (two) times daily with a meal. 08/09/14  Yolanda Manges, DO  Tiotropium Bromide Monohydrate (SPIRIVA RESPIMAT) 2.5 MCG/ACT AERS Inhale 2.5 mcg into the lungs daily.    [provider]  triamcinolone cream (KENALOG) 0.1 % Apply 1 application topically 2 (two) times daily as needed (for eczema).  05/13/14   [provider]    Allergies    Patient has no known allergies.  Review of Systems   Review of Systems  All other systems reviewed and are negative.  Physical Exam Updated Vital Signs BP 111/70 (BP Location: Left Arm)   Pulse (!) 53   Temp 98.5 F (36.9 C) (Oral)   Resp 16   Ht 1.6 m (5\' 3" )   Wt 65.8 kg   SpO2 94%   BMI 25.69 kg/m   Physical Exam Vitals and nursing note reviewed.  Constitutional:      General: She is not in  acute distress.    Appearance: She is well-developed.  HENT:     Head: Normocephalic and atraumatic.     Mouth/Throat:     Pharynx: No oropharyngeal exudate.  Eyes:     General: No scleral icterus.       Right eye: No discharge.        Left eye: No discharge.     Conjunctiva/sclera: Conjunctivae normal.     Pupils: Pupils are equal, round, and reactive to light.  Neck:     Thyroid: No thyromegaly.     Vascular: No JVD.  Cardiovascular:     Rate and Rhythm: Normal rate and regular rhythm.     Heart sounds: Normal heart sounds. No murmur heard.   No friction rub. No gallop.  Pulmonary:     Effort: Pulmonary effort is normal. No respiratory distress.     Breath sounds: Normal breath sounds. No wheezing or rales.  Abdominal:     General: Bowel sounds are normal. There is no distension.     Palpations: Abdomen is soft. There is no mass.     Tenderness: There is no abdominal tenderness.  Musculoskeletal:        General: Swelling and tenderness present. Normal range of motion.     Cervical back: Normal range of motion and neck supple.     Comments: There is tenderness swelling and deformity of the right foot.  There is bruising associated with the midfoot, it extends to the ankle.  Lymphadenopathy:     Cervical: No cervical adenopathy.  Skin:    General: Skin is warm and dry.     Findings: Bruising present. No erythema or rash.  Neurological:     General: No focal deficit present.     Mental Status: She is alert.     Coordination: Coordination normal.     Comments: Hard of hearing, the patient is able to move all 4 extremities with normal strength, she is able to lift both legs 5 out of 5 strength, she can straight leg raise without difficulty.  Cranial nerves III through XII are normal, she does have decreased hearing  Psychiatric:        Behavior: Behavior normal.    ED Results / Procedures / Treatments   Labs (all labs ordered are listed, but only abnormal results are  displayed) Labs Reviewed  BASIC METABOLIC PANEL - Abnormal; Notable for the following components:      Result Value   Glucose, Bld 135 (*)    BUN 25 (*)    Creatinine, Ser 1.66 (*)    GFR, Estimated 30 (*)  All other components within normal limits  CBC - Abnormal; Notable for the following components:   WBC 11.3 (*)    RBC 3.48 (*)    Hemoglobin 10.8 (*)    HCT 33.0 (*)    Platelets 142 (*)    All other components within normal limits  URINE CULTURE  URINALYSIS, ROUTINE W REFLEX MICROSCOPIC    EKG None  Radiology DG Tibia/Fibula Right  Result Date: 08/20/2021 CLINICAL DATA:  Multiple falls today with right lower leg pain, initial encounter EXAM: RIGHT TIBIA AND FIBULA - 2 VIEW COMPARISON:  None. FINDINGS: Irregularity of the distal fibula is noted consistent with oblique fracture although some mild healing is noted and this likely represents a subacute abnormality. No significant soft tissue swelling is seen. No other fracture is noted. IMPRESSION: Changes consistent with subacute distal fibular fracture involving the metaphysis with mild healing. Electronically Signed   By: Alcide Clever M.D.   On: 08/20/2021 22:48   DG Ankle Complete Right  Result Date: 08/20/2021 CLINICAL DATA:  Multiple falls with ankle and foot pain, initial encounter EXAM: RIGHT ANKLE - COMPLETE 3+ VIEW COMPARISON:  None FINDINGS: Oblique fracture of the distal fibular metaphysis is noted with mild callus formation consistent with a subacute healing fracture. No significant soft tissue abnormality is noted. Calcaneal spurring is seen. Known second metatarsal fracture is not well appreciated on these images. IMPRESSION: Partial healing of a distal fibular fracture consistent with subacute injury. Electronically Signed   By: Alcide Clever M.D.   On: 08/20/2021 22:50   DG Foot Complete Right  Result Date: 08/20/2021 CLINICAL DATA:  Multiple falls today with right foot and ankle pain, initial encounter EXAM:  RIGHT FOOT COMPLETE - 3+ VIEW COMPARISON:  None. FINDINGS: There is a fracture through the base of the second metatarsal with mild lateral displacement of the distal fracture fragment with respect to the proximal fracture fragment. Associated soft tissue swelling is noted dorsally. No other definitive fracture is seen. The known subacute fracture of the distal fibula is seen with mild healing. IMPRESSION: Fracture through the base of the second metatarsal as described. Partial healing of previous distal right fibular fracture. Electronically Signed   By: Alcide Clever M.D.   On: 08/20/2021 22:49    Procedures Procedures   Medications Ordered in ED Medications  morphine 4 MG/ML injection 4 mg (4 mg Intravenous Given 08/20/21 2240)    ED Course  I have reviewed the triage vital signs and the nursing notes.  Pertinent labs & imaging results that were available during my care of the patient were reviewed by me and considered in my medical decision making (see chart for details).    MDM Rules/Calculators/A&P                           The patient does not appear to be in distress, vital signs are rather unremarkable, she does have obvious bruising and swelling around the right foot consistent with possible fracture or internal injury of the ankle or foot.  Imaging pending, pain medications ordered, labs ordered to make sure she is not hyponatremic or having a urinary tract infection as she does have a history of this.  She is not having any myoclonic jerking and has no focal neurologic deficits, she states that she has some pain in her foot but absolutely denies any other painful areas on her body at this time.  X-ray shows isolated second metatarsal fracture,  I have discussed this with the patient and her family member, labs and urinalysis pending to make sure there is no signs of urinary tract infection or other fixable condition.  Change of shift - care signed out to Dr. Adela Lank  Final Clinical  Impression(s) / ED Diagnoses Final diagnoses:  Closed displaced fracture of second metatarsal bone of right foot, initial encounter    Rx / DC Orders ED Discharge Orders          Ordered    HYDROcodone-acetaminophen (NORCO/VICODIN) 5-325 MG tablet  Every 6 hours PRN        08/20/21 2322             Eber Hong, MD 08/20/21 2324

## 2021-08-20 NOTE — ED Notes (Signed)
Pt transported to xray 

## 2021-08-20 NOTE — Discharge Instructions (Addendum)
The x-ray shows that you have a broken foot, there is a single bone that is cracked and broken and you will need to see the orthopedic surgeon to follow-up.  Please see the phone number above for the orthopedic service, if you do not have an orthopedic doctor you can call to make an appointment with them within the week.  Please continue to wear the splint on your leg, do not put any pressure on your foot, if you need to use a wheelchair I have given you a prescription for 1.  See your family doctor for ongoing pain control needs however I have given you a prescription for hydrocodone, 1 tablet every 6 hours as needed.  Your lab work showed some signs of dehydration.  Please call your family doctor and have them see you in the office for lab recheck in about a week or so.  Please return to the ED for worsening.  Emergency department for severe or worsening symptoms

## 2021-08-20 NOTE — ED Triage Notes (Signed)
BIB [facility] from home. Pt has had multiple witnessed falls today. This last fall she stepped on her right foot. There is some bruising and swelling to the right ankle and foot. Pt did not hit head and not on any blood thinner.   130/80 54 heart rate 90% room air [id number]

## 2021-08-20 NOTE — ED Notes (Signed)
Ortho tech called to apply short leg splint

## 2021-08-21 LAB — URINALYSIS, ROUTINE W REFLEX MICROSCOPIC
Bilirubin Urine: NEGATIVE
Glucose, UA: NEGATIVE mg/dL
Hgb urine dipstick: NEGATIVE
Ketones, ur: 5 mg/dL — AB
Leukocytes,Ua: NEGATIVE
Nitrite: NEGATIVE
Protein, ur: NEGATIVE mg/dL
Specific Gravity, Urine: 1.014 (ref 1.005–1.030)
pH: 5 (ref 5.0–8.0)

## 2021-08-21 NOTE — ED Provider Notes (Signed)
I see the patient in signout from Dr. Hyacinth Meeker, briefly the patient is a 86 year old female with a chief complaints of frequent falls and multiple areas of pain.  Found to have a metatarsal fracture placed in a splint.  Plan to obtain blood work and a urine.  Patient has some renal dysfunction compared to her previous.  Given a bolus of IV fluids.  UA negative for infection.  Will discharge home.  PCP follow-up.     Melene Plan, DO 08/21/21 [phone]

## 2021-08-21 NOTE — ED Notes (Signed)
Patient verbalizes understanding of discharge instructions. Prescriptions and follow-up care reviewed. Opportunity for questioning and answers were provided. Armband removed by staff, pt discharged from ED ambualatory.

## 2021-08-21 NOTE — Progress Notes (Signed)
Orthopedic Tech Progress Note Patient Details:  Madeline Jimenez 1935-02-08 [id number]  Ortho Devices Type of Ortho Device: Post (short leg) splint Ortho Device/Splint Location: rle Ortho Device/Splint Interventions: Ordered, Application, Adjustment   Post Interventions Patient Tolerated: Well Instructions Provided: Care of device, Adjustment of device  Trinna Post 08/21/2021, 12:44 AM

## 2021-08-22 LAB — URINE CULTURE: Culture: NO GROWTH

## 2022-03-07 ENCOUNTER — Other Ambulatory Visit: Payer: Self-pay

## 2022-03-07 ENCOUNTER — Encounter (HOSPITAL_COMMUNITY): Payer: Self-pay

## 2022-03-07 ENCOUNTER — Inpatient Hospital Stay (HOSPITAL_COMMUNITY)
Admission: EM | Admit: 2022-03-07 | Discharge: 2022-03-10 | DRG: 690 | Disposition: A | Discharge status: Home / Self Care | Payer: Medicare Other | Attending: Family Medicine | Admitting: Family Medicine

## 2022-03-07 ENCOUNTER — Emergency Department (HOSPITAL_COMMUNITY): Payer: Medicare Other

## 2022-03-07 DIAGNOSIS — R197 Diarrhea, unspecified: Secondary | ICD-10-CM | POA: Diagnosis present

## 2022-03-07 DIAGNOSIS — Z7951 Long term (current) use of inhaled steroids: Secondary | ICD-10-CM

## 2022-03-07 DIAGNOSIS — Z66 Do not resuscitate: Secondary | ICD-10-CM | POA: Diagnosis present

## 2022-03-07 DIAGNOSIS — J189 Pneumonia, unspecified organism: Secondary | ICD-10-CM | POA: Diagnosis not present

## 2022-03-07 DIAGNOSIS — Z87891 Personal history of nicotine dependence: Secondary | ICD-10-CM | POA: Diagnosis not present

## 2022-03-07 DIAGNOSIS — I739 Peripheral vascular disease, unspecified: Secondary | ICD-10-CM | POA: Diagnosis present

## 2022-03-07 DIAGNOSIS — Z87440 Personal history of urinary (tract) infections: Secondary | ICD-10-CM

## 2022-03-07 DIAGNOSIS — Z8249 Family history of ischemic heart disease and other diseases of the circulatory system: Secondary | ICD-10-CM | POA: Diagnosis not present

## 2022-03-07 DIAGNOSIS — J9611 Chronic respiratory failure with hypoxia: Secondary | ICD-10-CM | POA: Diagnosis present

## 2022-03-07 DIAGNOSIS — R051 Acute cough: Secondary | ICD-10-CM

## 2022-03-07 DIAGNOSIS — R509 Fever, unspecified: Secondary | ICD-10-CM | POA: Diagnosis not present

## 2022-03-07 DIAGNOSIS — Z90710 Acquired absence of both cervix and uterus: Secondary | ICD-10-CM | POA: Diagnosis not present

## 2022-03-07 DIAGNOSIS — J449 Chronic obstructive pulmonary disease, unspecified: Secondary | ICD-10-CM | POA: Diagnosis present

## 2022-03-07 DIAGNOSIS — I129 Hypertensive chronic kidney disease with stage 1 through stage 4 chronic kidney disease, or unspecified chronic kidney disease: Secondary | ICD-10-CM | POA: Diagnosis present

## 2022-03-07 DIAGNOSIS — R0902 Hypoxemia: Secondary | ICD-10-CM

## 2022-03-07 DIAGNOSIS — R531 Weakness: Secondary | ICD-10-CM | POA: Diagnosis present

## 2022-03-07 DIAGNOSIS — N1831 Chronic kidney disease, stage 3a: Secondary | ICD-10-CM | POA: Diagnosis present

## 2022-03-07 DIAGNOSIS — E785 Hyperlipidemia, unspecified: Secondary | ICD-10-CM | POA: Diagnosis present

## 2022-03-07 DIAGNOSIS — I10 Essential (primary) hypertension: Secondary | ICD-10-CM | POA: Diagnosis not present

## 2022-03-07 DIAGNOSIS — Z9981 Dependence on supplemental oxygen: Secondary | ICD-10-CM | POA: Insufficient documentation

## 2022-03-07 DIAGNOSIS — J441 Chronic obstructive pulmonary disease with (acute) exacerbation: Secondary | ICD-10-CM | POA: Diagnosis present

## 2022-03-07 DIAGNOSIS — R5383 Other fatigue: Principal | ICD-10-CM

## 2022-03-07 DIAGNOSIS — Z79899 Other long term (current) drug therapy: Secondary | ICD-10-CM | POA: Diagnosis not present

## 2022-03-07 DIAGNOSIS — R9431 Abnormal electrocardiogram [ECG] [EKG]: Secondary | ICD-10-CM | POA: Diagnosis present

## 2022-03-07 DIAGNOSIS — N390 Urinary tract infection, site not specified: Secondary | ICD-10-CM | POA: Diagnosis present

## 2022-03-07 DIAGNOSIS — N3000 Acute cystitis without hematuria: Principal | ICD-10-CM | POA: Diagnosis present

## 2022-03-07 DIAGNOSIS — J9811 Atelectasis: Secondary | ICD-10-CM | POA: Diagnosis present

## 2022-03-07 LAB — CBC WITH DIFFERENTIAL/PLATELET
Abs Immature Granulocytes: 0.01 10*3/uL (ref 0.00–0.07)
Basophils Absolute: 0 10*3/uL (ref 0.0–0.1)
Basophils Relative: 0 %
Eosinophils Absolute: 0.1 10*3/uL (ref 0.0–0.5)
Eosinophils Relative: 1 %
HCT: 36.7 % (ref 36.0–46.0)
Hemoglobin: 11.9 g/dL — ABNORMAL LOW (ref 12.0–15.0)
Immature Granulocytes: 0 %
Lymphocytes Relative: 40 %
Lymphs Abs: 3.3 10*3/uL (ref 0.7–4.0)
MCH: 30.9 pg (ref 26.0–34.0)
MCHC: 32.4 g/dL (ref 30.0–36.0)
MCV: 95.3 fL (ref 80.0–100.0)
Monocytes Absolute: 0.7 10*3/uL (ref 0.1–1.0)
Monocytes Relative: 8 %
Neutro Abs: 4.2 10*3/uL (ref 1.7–7.7)
Neutrophils Relative %: 51 %
Platelets: 154 10*3/uL (ref 150–400)
RBC: 3.85 MIL/uL — ABNORMAL LOW (ref 3.87–5.11)
RDW: 14.3 % (ref 11.5–15.5)
WBC: 8.3 10*3/uL (ref 4.0–10.5)
nRBC: 0 % (ref 0.0–0.2)

## 2022-03-07 LAB — COMPREHENSIVE METABOLIC PANEL
ALT: 13 U/L (ref 0–44)
AST: 18 U/L (ref 15–41)
Albumin: 4.1 g/dL (ref 3.5–5.0)
Alkaline Phosphatase: 65 U/L (ref 38–126)
Anion gap: 10 (ref 5–15)
BUN: 22 mg/dL (ref 8–23)
CO2: 23 mmol/L (ref 22–32)
Calcium: 8.9 mg/dL (ref 8.9–10.3)
Chloride: 102 mmol/L (ref 98–111)
Creatinine, Ser: 1.15 mg/dL — ABNORMAL HIGH (ref 0.44–1.00)
GFR, Estimated: 47 mL/min — ABNORMAL LOW (ref >60)
Glucose, Bld: 100 mg/dL — ABNORMAL HIGH (ref 70–99)
Potassium: 3.7 mmol/L (ref 3.5–5.1)
Sodium: 135 mmol/L (ref 135–145)
Total Bilirubin: 0.6 mg/dL (ref 0.3–1.2)
Total Protein: 7.2 g/dL (ref 6.5–8.1)

## 2022-03-07 LAB — LIPASE, BLOOD: Lipase: 65 U/L — ABNORMAL HIGH (ref 11–51)

## 2022-03-07 LAB — URINALYSIS, ROUTINE W REFLEX MICROSCOPIC
Bilirubin Urine: NEGATIVE
Glucose, UA: NEGATIVE mg/dL
Hgb urine dipstick: NEGATIVE
Ketones, ur: NEGATIVE mg/dL
Nitrite: NEGATIVE
Protein, ur: NEGATIVE mg/dL
Specific Gravity, Urine: 1.015 (ref 1.005–1.030)
pH: 5 (ref 5.0–8.0)

## 2022-03-07 LAB — LACTIC ACID, PLASMA
Lactic Acid, Venous: 2.1 mmol/L (ref 0.5–1.9)
Lactic Acid, Venous: 1 mmol/L (ref 0.5–1.9)

## 2022-03-07 MED ORDER — SODIUM CHLORIDE 0.9 % IV SOLN
1.0000 g | Freq: Once | INTRAVENOUS | Status: AC
  Administered 2022-03-07: 1 g via INTRAVENOUS
  Filled 2022-03-07: qty 10

## 2022-03-07 MED ORDER — LACTATED RINGERS IV SOLN: INTRAVENOUS | Status: AC

## 2022-03-07 MED ORDER — ENOXAPARIN SODIUM 40 MG/0.4ML IJ SOSY
40.0000 mg | PREFILLED_SYRINGE | INTRAMUSCULAR | Status: DC
  Administered 2022-03-08 – 2022-03-10 (×3): 40 mg via SUBCUTANEOUS
  Filled 2022-03-07 (×3): qty 0.4

## 2022-03-07 MED ORDER — TRAZODONE HCL 50 MG PO TABS
50.0000 mg | ORAL_TABLET | Freq: Every day | ORAL | Status: DC
  Administered 2022-03-08 – 2022-03-09 (×3): 50 mg via ORAL
  Filled 2022-03-07 (×3): qty 1

## 2022-03-07 MED ORDER — ACETAMINOPHEN 650 MG RE SUPP: 650.0000 mg | Freq: Four times a day (QID) | RECTAL | Status: DC | PRN

## 2022-03-07 MED ORDER — AMLODIPINE BESYLATE 5 MG PO TABS
5.0000 mg | ORAL_TABLET | Freq: Every day | ORAL | Status: DC
  Administered 2022-03-08 – 2022-03-10 (×3): 5 mg via ORAL
  Filled 2022-03-07 (×3): qty 1

## 2022-03-07 MED ORDER — SENNOSIDES-DOCUSATE SODIUM 8.6-50 MG PO TABS: 1.0000 | ORAL_TABLET | Freq: Every evening | ORAL | Status: DC | PRN

## 2022-03-07 MED ORDER — FLUTICASONE FUROATE-VILANTEROL 100-25 MCG/ACT IN AEPB
1.0000 | INHALATION_SPRAY | Freq: Every day | RESPIRATORY_TRACT | Status: DC
Start: 2022-03-08 — End: 2022-03-11
  Administered 2022-03-09 – 2022-03-10 (×2): 1 via RESPIRATORY_TRACT
  Filled 2022-03-07: qty 28

## 2022-03-07 MED ORDER — POTASSIUM CHLORIDE CRYS ER 20 MEQ PO TBCR
20.0000 meq | EXTENDED_RELEASE_TABLET | Freq: Once | ORAL | Status: AC
  Administered 2022-03-08: 20 meq via ORAL
  Filled 2022-03-07: qty 1

## 2022-03-07 MED ORDER — SODIUM CHLORIDE 0.9 % IV SOLN
500.0000 mg | Freq: Once | INTRAVENOUS | Status: DC
  Filled 2022-03-07: qty 5

## 2022-03-07 MED ORDER — UMECLIDINIUM BROMIDE 62.5 MCG/ACT IN AEPB
1.0000 | INHALATION_SPRAY | Freq: Every day | RESPIRATORY_TRACT | Status: DC
  Administered 2022-03-09 – 2022-03-10 (×2): 1 via RESPIRATORY_TRACT
  Filled 2022-03-07: qty 7

## 2022-03-07 MED ORDER — MAGNESIUM SULFATE IN D5W 1-5 GM/100ML-% IV SOLN
1.0000 g | Freq: Once | INTRAVENOUS | Status: AC
  Administered 2022-03-08: 1 g via INTRAVENOUS
  Filled 2022-03-07: qty 100

## 2022-03-07 MED ORDER — ALBUTEROL SULFATE (2.5 MG/3ML) 0.083% IN NEBU: 2.5000 mg | INHALATION_SOLUTION | RESPIRATORY_TRACT | Status: DC | PRN

## 2022-03-07 MED ORDER — ACETAMINOPHEN 325 MG PO TABS
650.0000 mg | ORAL_TABLET | Freq: Four times a day (QID) | ORAL | Status: DC | PRN
  Administered 2022-03-10: 650 mg via ORAL
  Filled 2022-03-07: qty 2

## 2022-03-07 MED ORDER — SODIUM CHLORIDE 0.9% FLUSH
3.0000 mL | Freq: Two times a day (BID) | INTRAVENOUS | Status: DC
  Administered 2022-03-08 – 2022-03-10 (×6): 3 mL via INTRAVENOUS

## 2022-03-07 MED ORDER — SODIUM CHLORIDE 0.9 % IV SOLN
2.0000 g | INTRAVENOUS | Status: DC
  Administered 2022-03-08: 2 g via INTRAVENOUS
  Filled 2022-03-07: qty 20

## 2022-03-07 NOTE — ED Triage Notes (Addendum)
Pt BIB [facility]. Pt's family called EMS d/t c/o increased weakness and urinary incontinence over the last two days. Pt. Has no complaints of Sx at this time.

## 2022-03-07 NOTE — ED Notes (Signed)
First set of blood cultures as well as rainbow of tubes sent down at 1835 and second set of cultures sent down at 1845.

## 2022-03-07 NOTE — ED Notes (Signed)
Placed patient on 2L [place] per MD due to patient oxygen sat being 87% with a good wave form

## 2022-03-07 NOTE — H&P (Signed)
History and Physical    Madeline Jimenez ZOX:[id number] DOB: Sep 07, 1935 DOA: 03/07/2022  PCP: Evie Lacks, NP (Inactive)   Patient coming from: Home   Chief Complaint: Fatigue, urinary frequency, incontinence   HPI: Madeline Jimenez is a pleasant 87 y.o. female with medical history significant for COPD, hypertension, and nocturnal supplemental oxygen requirement, now presenting to the emergency department with fatigue, increased urinary frequency, and new urinary incontinence.  Patient lives with her niece and is typically independent with her ADLs, but has had significant fatigue over the past 2 days, urinary frequency, and has developed new urinary incontinence.  She denies cough, shortness of breath, abdominal pain, flank pain, nausea, or vomiting.  She reports having 2 or 3 loose stools today.  Patient and her son are not aware of any sick contacts.  She drinks 3 beers every night but denies any history of withdrawal symptoms.  She typically only uses supplemental oxygen at night.  ED Course: Upon arrival to the ED, patient is found to be febrile to 38 C and saturating 87 to 99% on room air with systolic blood pressure in the 110s and greater.  Initial lactic acid was 2.1, normalizing to 1.0 without fluids.  Chest x-ray features of low volume lungs with bibasilar atelectasis.  Chemistry panel notable for creatinine 1.15.  QTc is 524 ms on EKG.  Blood and urine cultures were collected and the patient was given Rocephin.  Review of Systems:  All other systems reviewed and apart from HPI, are negative.  Past Medical History:  Diagnosis Date   COPD (chronic obstructive pulmonary disease) (HCC)    HTN (hypertension)    Hyperlipidemia    Hyponatremia    Lymphocytosis    Peripheral vascular disease (HCC)    Recurrent UTI     Past Surgical History:  Procedure Laterality Date   ABDOMINAL HYSTERECTOMY     for fibroid   TONSILECTOMY, ADENOIDECTOMY, BILATERAL MYRINGOTOMY AND TUBES       Social History:   reports that she has quit smoking. She has a 100.00 pack-year smoking history. She has never used smokeless tobacco. She reports that she does not drink alcohol and does not use drugs.  No Known Allergies  Family History  Problem Relation Age of Onset   Hypertension Father    Heart attack Father    Heart attack Sister      Prior to Admission medications   Medication Sig Start Date End Date Taking? Authorizing Provider  albuterol (ACCUNEB) 0.63 MG/3ML nebulizer solution Take 1 ampule by nebulization every 6 (six) hours as needed for wheezing.   Yes [provider]  amLODipine (NORVASC) 5 MG tablet Take 5 mg by mouth daily. 03/06/22  Yes [provider]  Ascorbic Acid (VITAMIN C PO) Take 1 tablet by mouth daily.   Yes [provider]  escitalopram (LEXAPRO) 10 MG tablet Take 10 mg by mouth daily. 01/26/22  Yes [provider]  Multiple Vitamins-Minerals (MULTIVITAMIN WITH MINERALS) tablet Take 1 tablet by mouth daily.   Yes [provider]  traZODone (DESYREL) 50 MG tablet Take 50 mg by mouth daily. 12/14/21  Yes [provider]  TRELEGY ELLIPTA 100-62.5-25 MCG/ACT AEPB Inhale 1 puff into the lungs daily. 02/13/22  Yes [provider]  HYDROcodone-acetaminophen (NORCO/VICODIN) 5-325 MG tablet Take 1 tablet by mouth every 6 (six) hours as needed for moderate pain. Patient not taking: Reported on 03/07/2022 08/20/21   Eber Hong, MD  labetalol (NORMODYNE) 200 MG tablet Take 1 tablet (  200 mg total) by mouth 2 (two) times daily. Patient not taking: Reported on 03/07/2022 08/09/14   Yolanda Manges, DO  losartan (COZAAR) 100 MG tablet Take 1 tablet (100 mg total) by mouth every morning. Patient not taking: Reported on 03/07/2022 08/09/14   Yolanda Manges, DO  sodium chloride 1 G tablet Take 1 tablet (1 g total) by mouth 2 (two) times daily with a meal. Patient not taking: Reported on 03/07/2022 08/09/14   Yolanda Manges, DO    Physical Exam: Vitals:   03/07/22 2315 03/07/22 2330 03/07/22 2345 03/08/22 0000  BP:  (!) 119/59    Pulse: 63 (!) 59 (!) 58 68  Resp: 14 10 19  (!) 21  Temp:      TempSrc:      SpO2: 98% 100% 97% 100%    Constitutional: NAD, calm  Eyes: PERTLA, lids and conjunctivae normal ENMT: Mucous membranes are moist. Posterior pharynx clear of any exudate or lesions.   Neck: supple, no masses  Respiratory: Diminished bilaterally, no wheezing, no crackles. No accessory muscle use.  Cardiovascular: S1 & S2 heard, regular rate and rhythm. No extremity edema.  Abdomen: No distension, no tenderness, soft. Bowel sounds active.  Musculoskeletal: no clubbing / cyanosis. No joint deformity upper and lower extremities.   Skin: no significant rashes, lesions, ulcers. Warm, dry, well-perfused. Neurologic: Gross hearing deficit, no dysarthria or aphasia. Moving all extremities. Alert and oriented to person, place, and situation.  Psychiatric: Very pleasant. Cooperative.    Labs and Imaging on Admission: I have personally reviewed following labs and imaging studies  CBC: Recent Labs  Lab 03/07/22 1933  WBC 8.3  NEUTROABS 4.2  HGB 11.9*  HCT 36.7  MCV 95.3  PLT 154   Basic Metabolic Panel: Recent Labs  Lab 03/07/22 1933  NA 135  K 3.7  CL 102  CO2 23  GLUCOSE 100*  BUN 22  CREATININE 1.15*  CALCIUM 8.9   GFR: CrCl cannot be calculated (Unknown ideal weight.). Liver Function Tests: Recent Labs  Lab 03/07/22 1933  AST 18  ALT 13  ALKPHOS 65  BILITOT 0.6  PROT 7.2  ALBUMIN 4.1   Recent Labs  Lab 03/07/22 1933  LIPASE 65*   No results for input(s): AMMONIA in the last 168 hours. Coagulation Profile: No results for input(s): INR, PROTIME in the last 168 hours. Cardiac Enzymes: No results for input(s): CKTOTAL, CKMB, CKMBINDEX, TROPONINI in the last 168 hours. BNP (last 3 results) No results for input(s): PROBNP in the last 8760 hours. HbA1C: No results for  input(s): HGBA1C in the last 72 hours. CBG: No results for input(s): GLUCAP in the last 168 hours. Lipid Profile: No results for input(s): CHOL, HDL, LDLCALC, TRIG, CHOLHDL, LDLDIRECT in the last 72 hours. Thyroid Function Tests: No results for input(s): TSH, T4TOTAL, FREET4, T3FREE, THYROIDAB in the last 72 hours. Anemia Panel: No results for input(s): VITAMINB12, FOLATE, FERRITIN, TIBC, IRON, RETICCTPCT in the last 72 hours. Urine analysis:    Component Value Date/Time   COLORURINE YELLOW 03/07/2022 2035   APPEARANCEUR HAZY (A) 03/07/2022 2035   APPEARANCEUR Cloudy 01/25/2012 1637   LABSPEC 1.015 03/07/2022 2035   LABSPEC 1.003 01/25/2012 1637   PHURINE 5.0 03/07/2022 2035   GLUCOSEU NEGATIVE 03/07/2022 2035   GLUCOSEU Negative 01/25/2012 1637   HGBUR NEGATIVE 03/07/2022 2035   BILIRUBINUR NEGATIVE 03/07/2022 2035   BILIRUBINUR Negative 01/25/2012 1637   KETONESUR NEGATIVE 03/07/2022 2035   PROTEINUR NEGATIVE 03/07/2022 2035  UROBILINOGEN 1.0 08/07/2014 1715   NITRITE NEGATIVE 03/07/2022 2035   LEUKOCYTESUR LARGE (A) 03/07/2022 2035   LEUKOCYTESUR 2+ 01/25/2012 1637   Sepsis Labs: @LABRCNTIP (procalcitonin:4,lacticidven:4) )No results found for this or any previous visit (from the past 240 hour(s)).   Radiological Exams on Admission: DG Chest Portable 1 View  Result Date: 03/07/2022 CLINICAL DATA:  Weakness and urinary incontinence. EXAM: PORTABLE CHEST 1 VIEW COMPARISON:  August 07, 2014 FINDINGS: The heart size and mediastinal contours are within normal limits. Low lung volumes are noted. Mild atelectasis is seen within the bilateral lung bases. There is no evidence of a pleural effusion or pneumothorax. Multiple chronic left-sided rib fractures are seen. IMPRESSION: Low lung volumes with bibasilar atelectasis. Electronically Signed   By: Aram Candela M.D.   On: 03/07/2022 19:53    EKG: Independently reviewed. Sinus rhythm, PAC, non-specific IVCD, QTc 524 ms.    Assessment/Plan   1. UTI  - Presents with fever, fatigue, and urinary symptoms and found to be febrile with pyuria and bacteriuria  - Blood and urine cultures were collected in the ED and she was started on Rocephin  - Continue Rocephin, monitor cultures and clinical response to treatment    2. Prolonged QT interval  - QTc is 524 ms in ED  - Supplement potassium, check magnesium level, avoid QT-prolonging medications    3. COPD; chronic hypoxic respiratory failure  - Typically only uses supplemental O2 at night  - Not in exacerbation on admission   - Continue LABA-ICS, LAMA, and as-needed SABA, continue supplemental O2    4. CKD IIIa  - SCr is 1.15 on admission; was 1.66 in November 2022  - Renally-dose medications, monitor   5. Hypertension  - Continue Norvasc    6. Daily alcohol use  - Drinks 3 beers every night  - Monitor with CIWA and use Ativan if needed, supplement b-vitamins     DVT prophylaxis: Lovenox  Code Status: DNR. Discussed with patient and her son on admission.  Level of Care: Level of care: Telemetry Family Communication: Son at bedside  Disposition Plan:  Patient is from: home  Anticipated d/c is to: Home  Anticipated d/c date is: 5/25 or 03/10/22  Patient currently: pending improvement in infection, may need PT eval  Consults called: none  Admission status: Inpatient     Briscoe Deutscher, MD [facility]  03/08/2022, 12:16 AM

## 2022-03-07 NOTE — ED Provider Notes (Signed)
[facility] DEPT Provider Note   CSN: [id number] Arrival date & time: 03/07/22  1814     History  Chief Complaint  Patient presents with   Weakness    Madeline Jimenez is a 87 y.o. female.  The history is provided by the patient and medical records. No language interpreter was used.  Cough Cough characteristics:  Non-productive Severity:  Moderate Onset quality:  Gradual Duration:  3 days Timing:  Constant Progression:  Waxing and waning Chronicity:  New Relieved by:  Nothing Worsened by:  Nothing Ineffective treatments:  None tried Associated symptoms: chills and fever   Associated symptoms: no chest pain, no diaphoresis, no headaches, no myalgias, no rash, no shortness of breath, no sinus congestion and no wheezing       Home Medications Prior to Admission medications   Medication Sig Start Date End Date Taking? Authorizing Provider  albuterol (ACCUNEB) 0.63 MG/3ML nebulizer solution Take 1 ampule by nebulization every 6 (six) hours as needed for wheezing.    [provider]  HYDROcodone-acetaminophen (NORCO/VICODIN) 5-325 MG tablet Take 1 tablet by mouth every 6 (six) hours as needed for moderate pain. 08/20/21   Noemi Chapel, MD  labetalol (NORMODYNE) 200 MG tablet Take 1 tablet (200 mg total) by mouth 2 (two) times daily. 08/09/14   Francesca Oman, DO  losartan (COZAAR) 100 MG tablet Take 1 tablet (100 mg total) by mouth every morning. 08/09/14   Francesca Oman, DO  PROAIR HFA 108 (90 BASE) MCG/ACT inhaler Inhale 1 puff into the lungs every 6 (six) hours as needed. 04/29/14   [provider]  sodium chloride 1 G tablet Take 1 tablet (1 g total) by mouth 2 (two) times daily with a meal. 08/09/14   Francesca Oman, DO  Tiotropium Bromide Monohydrate (SPIRIVA RESPIMAT) 2.5 MCG/ACT AERS Inhale 2.5 mcg into the lungs daily.    [provider]  triamcinolone cream (KENALOG) 0.1 % Apply 1 application topically 2 (two)  times daily as needed (for eczema).  05/13/14   [provider]      Allergies    Patient has no known allergies.    Review of Systems   Review of Systems  Constitutional:  Positive for chills, fatigue and fever. Negative for diaphoresis.  HENT:  Negative for congestion.   Respiratory:  Positive for cough. Negative for chest tightness, shortness of breath and wheezing.   Cardiovascular:  Negative for chest pain, palpitations and leg swelling.  Gastrointestinal:  Negative for abdominal pain, constipation, diarrhea, nausea and vomiting.  Genitourinary:  Negative for dysuria and flank pain.  Musculoskeletal:  Negative for back pain, myalgias, neck pain and neck stiffness.  Skin:  Negative for rash and wound.  Neurological:  Positive for weakness (generalized and nonfocal). Negative for dizziness, light-headedness, numbness and headaches.  Psychiatric/Behavioral:  Negative for agitation and confusion.   All other systems reviewed and are negative.  Physical Exam Updated Vital Signs BP 132/64 (BP Location: Left Arm)   Pulse 77   Temp 99.3 F (37.4 C) (Oral)   Resp 17   SpO2 92%  Physical Exam Vitals and nursing note reviewed.  Constitutional:      General: She is not in acute distress.    Appearance: She is well-developed. She is not ill-appearing, toxic-appearing or diaphoretic.  HENT:     Head: Normocephalic and atraumatic.     Mouth/Throat:     Mouth: Mucous membranes are moist.     Pharynx: No oropharyngeal  exudate or posterior oropharyngeal erythema.  Eyes:     Extraocular Movements: Extraocular movements intact.     Conjunctiva/sclera: Conjunctivae normal.     Pupils: Pupils are equal, round, and reactive to light.  Cardiovascular:     Rate and Rhythm: Normal rate and regular rhythm.     Heart sounds: No murmur heard. Pulmonary:     Effort: Pulmonary effort is normal. No respiratory distress.     Breath sounds: Rhonchi present. No wheezing or rales.  Chest:      Chest wall: No tenderness.  Abdominal:     General: Abdomen is flat.     Palpations: Abdomen is soft.     Tenderness: There is no abdominal tenderness. There is no right CVA tenderness, left CVA tenderness, guarding or rebound.  Musculoskeletal:        General: No swelling, tenderness or signs of injury.     Cervical back: Neck supple. No tenderness.     Right lower leg: No edema.     Left lower leg: No edema.  Skin:    General: Skin is warm and dry.     Capillary Refill: Capillary refill takes less than 2 seconds.     Findings: No bruising, erythema or rash.  Neurological:     General: No focal deficit present.     Mental Status: She is alert.     Sensory: No sensory deficit.     Motor: No weakness.  Psychiatric:        Mood and Affect: Mood normal.    ED Results / Procedures / Treatments   Labs (all labs ordered are listed, but only abnormal results are displayed) Labs Reviewed  CBC WITH DIFFERENTIAL/PLATELET - Abnormal; Notable for the following components:      Result Value   RBC 3.85 (*)    Hemoglobin 11.9 (*)    All other components within normal limits  COMPREHENSIVE METABOLIC PANEL - Abnormal; Notable for the following components:   Glucose, Bld 100 (*)    Creatinine, Ser 1.15 (*)    GFR, Estimated 47 (*)    All other components within normal limits  LACTIC ACID, PLASMA - Abnormal; Notable for the following components:   Lactic Acid, Venous 2.1 (*)    All other components within normal limits  LIPASE, BLOOD - Abnormal; Notable for the following components:   Lipase 65 (*)    All other components within normal limits  URINALYSIS, ROUTINE W REFLEX MICROSCOPIC - Abnormal; Notable for the following components:   APPearance HAZY (*)    Leukocytes,Ua LARGE (*)    Bacteria, UA RARE (*)    All other components within normal limits  URINE CULTURE  CULTURE, BLOOD (ROUTINE X 2)  CULTURE, BLOOD (ROUTINE X 2)  RESP PANEL BY RT-PCR (FLU A&B, COVID) [id number]  LACTIC ACID,  PLASMA    EKG None  Radiology DG Chest Portable 1 View  Result Date: 03/07/2022 CLINICAL DATA:  Weakness and urinary incontinence. EXAM: PORTABLE CHEST 1 VIEW COMPARISON:  August 07, 2014 FINDINGS: The heart size and mediastinal contours are within normal limits. Low lung volumes are noted. Mild atelectasis is seen within the bilateral lung bases. There is no evidence of a pleural effusion or pneumothorax. Multiple chronic left-sided rib fractures are seen. IMPRESSION: Low lung volumes with bibasilar atelectasis. Electronically Signed   By: Virgina Norfolk M.D.   On: 03/07/2022 19:53    Procedures Procedures    Medications Ordered in ED Medications - No data to display  ED Course/ Medical Decision Making/ A&P                           Medical Decision Making Problems Addressed: Acute cough: acute illness or injury Fatigue, unspecified type: acute illness or injury Fever, unspecified fever cause: acute illness or injury Hypoxia: acute illness or injury that poses a threat to life or bodily functions  Amount and/or Complexity of Data Reviewed Independent Historian: parent Labs: ordered. Radiology: ordered.   Madeline Jimenez is a 87 y.o. female with a past medical history significant for COPD, hypertension, hyperlipidemia, peripheral vascular disease, previous urinary tract infections, and chronic anemia who presents via EMS for generalized fatigue, weakness, and reported urinary incontinence for the last few days.  Patient denies urinary symptoms but does report that she has felt very fatigued and tired and had some chills.  She also reports a cough but no shortness of breath.  During my initial evaluation, she is warm to the touch and rectal temperature was found to be positive with a temperature of 100.4.  She also was hypoxic with oxygen saturations going into the mid 80s at 87 with a good waveform.  She was not tachycardic or tachypneic during my exam however.  She denies  any chest pain, palpitations, or shortness of breath.  Denies any nausea, vomiting, constipation, or diarrhea.  She denies any urinary symptoms to her and denies any leg pain or leg swelling.  Denies any rashes or other complaints.  On exam, lungs did have some rhonchi but chest and abdomen were nontender.  Patient moving all extremities.  Patient resting comfortably.  Given patient's new hypoxia, we will put her on oxygen.  With her rhonchi and fever I am concerned about possible pneumonia.  We will get chest x-ray and basic labs including blood culture, urine culture, urinalysis, and keep her on the monitor.  Anticipate admission for new  Hypoxia with fatigue and generalized weakness.       With new hypoxia we will work-up for possible life-threatening condition.   Patient's x-ray did not show pneumonia however clinically Still concerned about it.  Patient family arrived and said that she occasionally takes oxygen at night but not during the day.  Patient was hypoxic while talking with me and wide-awake.  Family also reports that she has had some frequency and patient agrees for urinary frequency.  Urinalysis returned showing leukocytes and bacteria.  Suspect UTI.  Given the patient reporting severe fatigue, this worsened oxygen requirement, and evidence of UTI with some mild confusion per family, do not feel she is safe for discharge home.  We will call for admission for further management.        Final Clinical Impression(s) / ED Diagnoses Final diagnoses:  Fatigue, unspecified type  Fever, unspecified fever cause  Acute cough  Hypoxia  Community acquired pneumonia, unspecified laterality  Acute cystitis without hematuria   Clinical Impression: 1. Fatigue, unspecified type   2. Fever, unspecified fever cause   3. Acute cough   4. Hypoxia   5. Community acquired pneumonia, unspecified laterality   6. Acute cystitis without hematuria     Disposition: Admit  This note was  prepared with assistance of Dragon voice recognition software. Occasional wrong-word or sound-a-like substitutions may have occurred due to the inherent limitations of voice recognition software.      Rayane Gallardo, Gwenyth Allegra, MD 03/07/22 [phone]

## 2022-03-08 DIAGNOSIS — J9611 Chronic respiratory failure with hypoxia: Secondary | ICD-10-CM | POA: Insufficient documentation

## 2022-03-08 DIAGNOSIS — N1831 Chronic kidney disease, stage 3a: Secondary | ICD-10-CM | POA: Diagnosis not present

## 2022-03-08 DIAGNOSIS — R197 Diarrhea, unspecified: Secondary | ICD-10-CM

## 2022-03-08 DIAGNOSIS — N3000 Acute cystitis without hematuria: Secondary | ICD-10-CM | POA: Diagnosis not present

## 2022-03-08 DIAGNOSIS — R509 Fever, unspecified: Secondary | ICD-10-CM

## 2022-03-08 DIAGNOSIS — J9620 Acute and chronic respiratory failure, unspecified whether with hypoxia or hypercapnia: Secondary | ICD-10-CM | POA: Insufficient documentation

## 2022-03-08 LAB — BASIC METABOLIC PANEL
Anion gap: 7 (ref 5–15)
BUN: 29 mg/dL — ABNORMAL HIGH (ref 8–23)
CO2: 27 mmol/L (ref 22–32)
Calcium: 8.9 mg/dL (ref 8.9–10.3)
Chloride: 103 mmol/L (ref 98–111)
Creatinine, Ser: 1.23 mg/dL — ABNORMAL HIGH (ref 0.44–1.00)
GFR, Estimated: 43 mL/min — ABNORMAL LOW (ref >60)
Glucose, Bld: 101 mg/dL — ABNORMAL HIGH (ref 70–99)
Potassium: 3.4 mmol/L — ABNORMAL LOW (ref 3.5–5.1)
Sodium: 137 mmol/L (ref 135–145)

## 2022-03-08 LAB — URINE CULTURE: Culture: NO GROWTH

## 2022-03-08 LAB — CBC
HCT: 33 % — ABNORMAL LOW (ref 36.0–46.0)
Hemoglobin: 10.6 g/dL — ABNORMAL LOW (ref 12.0–15.0)
MCH: 30.5 pg (ref 26.0–34.0)
MCHC: 32.1 g/dL (ref 30.0–36.0)
MCV: 95.1 fL (ref 80.0–100.0)
Platelets: 120 10*3/uL — ABNORMAL LOW (ref 150–400)
RBC: 3.47 MIL/uL — ABNORMAL LOW (ref 3.87–5.11)
RDW: 14.3 % (ref 11.5–15.5)
WBC: 6.9 10*3/uL (ref 4.0–10.5)
nRBC: 0 % (ref 0.0–0.2)

## 2022-03-08 LAB — MAGNESIUM: Magnesium: 2.2 mg/dL (ref 1.7–2.4)

## 2022-03-08 MED ORDER — THIAMINE HCL 100 MG/ML IJ SOLN
100.0000 mg | Freq: Every day | INTRAMUSCULAR | Status: DC
  Filled 2022-03-08: qty 2

## 2022-03-08 MED ORDER — THIAMINE HCL 100 MG PO TABS
100.0000 mg | ORAL_TABLET | Freq: Every day | ORAL | Status: DC
  Administered 2022-03-08 – 2022-03-10 (×3): 100 mg via ORAL
  Filled 2022-03-08 (×3): qty 1

## 2022-03-08 MED ORDER — POTASSIUM CHLORIDE CRYS ER 20 MEQ PO TBCR
40.0000 meq | EXTENDED_RELEASE_TABLET | Freq: Once | ORAL | Status: AC
  Administered 2022-03-08: 40 meq via ORAL
  Filled 2022-03-08: qty 2

## 2022-03-08 MED ORDER — ADULT MULTIVITAMIN W/MINERALS CH
1.0000 | ORAL_TABLET | Freq: Every day | ORAL | Status: DC
  Administered 2022-03-08 – 2022-03-10 (×3): 1 via ORAL
  Filled 2022-03-08 (×3): qty 1

## 2022-03-08 MED ORDER — LORAZEPAM 1 MG PO TABS: 1.0000 mg | ORAL_TABLET | ORAL | Status: DC | PRN

## 2022-03-08 MED ORDER — LORAZEPAM 1 MG PO TABS: 0.0000 mg | ORAL_TABLET | Freq: Two times a day (BID) | ORAL | Status: DC

## 2022-03-08 MED ORDER — FOLIC ACID 1 MG PO TABS
1.0000 mg | ORAL_TABLET | Freq: Every day | ORAL | Status: DC
  Administered 2022-03-08 – 2022-03-10 (×3): 1 mg via ORAL
  Filled 2022-03-08 (×3): qty 1

## 2022-03-08 MED ORDER — LORAZEPAM 1 MG PO TABS: 0.0000 mg | ORAL_TABLET | Freq: Four times a day (QID) | ORAL | Status: AC

## 2022-03-08 MED ORDER — LORAZEPAM 2 MG/ML IJ SOLN: 1.0000 mg | INTRAMUSCULAR | Status: DC | PRN

## 2022-03-08 NOTE — Progress Notes (Addendum)
[facility]  PROGRESS NOTE  Madeline Jimenez [id number] DOB: 04/30/1935 DOA: 03/07/2022 PCP: Laneta Simmers, NP (Inactive)   Brief HPI:   87 year old female with medical history of COPD, hypertension, nocturnal supplemental oxygen requirement came to ED with fatigue, increased urinary frequency and new urinary incontinence.  Also having 2-3 loose stool every day. In the ED she was found to be febrile with temperature 38 C, O2 sats 87 to 89% on room air.  Lactic acid was 2.1 normalized to 1.0 without fluids.    Subjective   Patient seen and examined, continues to have loose stools.   Assessment/Plan:    UTI -Presented with fever, fatigue, urinary symptoms -Found to be febrile with pyuria and bacteriuria -Started on IV Rocephin -Follow urine culture results  Prolonged QT interval -QTc 524 ms in the ED -Serum potassium 3.4, replaced -Serum magnesium 2.2  COPD/chronic hypoxemic respiratory failure -Typically uses supplemental oxygen at night -Not in exacerbation -Continue bronchodilators  CKD stage IIIa -Creatinine 1.23, at baseline  Hypertension -Continue Norvasc  Daily alcohol use -Drinks 3 beers every night -Monitor with CIWA protocol -Continue supplemental B vitamins  Diarrhea -Check for stool for C. difficile and GI pathogen panel  Medications     amLODipine  5 mg Oral Daily   enoxaparin (LOVENOX) injection  40 mg Subcutaneous Q24H   fluticasone furoate-vilanterol  1 puff Inhalation Daily   And   umeclidinium bromide  1 puff Inhalation Daily   folic acid  1 mg Oral Daily   LORazepam  0-4 mg Oral Q6H   Followed by   Derrill Memo ON 03/10/2022] LORazepam  0-4 mg Oral Q12H   multivitamin with minerals  1 tablet Oral Daily   sodium chloride flush  3 mL Intravenous Q12H   thiamine  100 mg Oral Daily   Or   thiamine  100 mg Intravenous Daily   traZODone  50 mg Oral QHS     Data Reviewed:   CBG:  No results for input(s): GLUCAP in the last  168 hours.  SpO2: 98 % O2 Flow Rate (L/min): 2 L/min    Vitals:   03/08/22 1004 03/08/22 1147 03/08/22 1312 03/08/22 1821  BP: (!) 119/53 139/71 (!) 135/59 133/66  Pulse: (!) 52 (!) 54 (!) 54 77  Resp:   20   Temp:   98 F (36.7 C)   TempSrc:   Oral   SpO2:  99% 98%   Weight:      Height:          Data Reviewed:  Basic Metabolic Panel: Recent Labs  Lab 03/07/22 1933 03/08/22 0455  NA 135 137  K 3.7 3.4*  CL 102 103  CO2 23 27  GLUCOSE 100* 101*  BUN 22 29*  CREATININE 1.15* 1.23*  CALCIUM 8.9 8.9  MG  --  2.2    CBC: Recent Labs  Lab 03/07/22 1933 03/08/22 0455  WBC 8.3 6.9  NEUTROABS 4.2  --   HGB 11.9* 10.6*  HCT 36.7 33.0*  MCV 95.3 95.1  PLT 154 120*    LFT Recent Labs  Lab 03/07/22 1933  AST 18  ALT 13  ALKPHOS 65  BILITOT 0.6  PROT 7.2  ALBUMIN 4.1     Antibiotics: Anti-infectives (From admission, onward)    Start     Dose/Rate Route Frequency Ordered Stop   03/08/22 2300  cefTRIAXone (ROCEPHIN) 2 g in sodium chloride 0.9 % 100 mL IVPB        2 g  200 mL/hr over 30 Minutes Intravenous Every 24 hours 03/07/22 2345 03/15/22 2259   03/07/22 2300  cefTRIAXone (ROCEPHIN) 1 g in sodium chloride 0.9 % 100 mL IVPB        1 g 200 mL/hr over 30 Minutes Intravenous  Once 03/07/22 2254 03/08/22 0009   03/07/22 2300  azithromycin (ZITHROMAX) 500 mg in sodium chloride 0.9 % 250 mL IVPB  Status:  Discontinued        500 mg 250 mL/hr over 60 Minutes Intravenous  Once 03/07/22 2254 03/07/22 2332        DVT prophylaxis: Lovenox  Code Status: Full code  Family Communication: No family at bedside   CONSULTS    Objective    Physical Examination:   General-appears in no acute distress Heart-S1-S2, regular, no murmur auscultated Lungs-clear to auscultation bilaterally, no wheezing or crackles auscultated Abdomen-soft, nontender, no organomegaly Extremities-no edema in the lower extremities Neuro-alert, oriented x3, no focal deficit  noted   Status is: Inpatient: UTI           [place]   [facility] If 7PM-7AM, please contact night-coverage at [email or web address], Office  [phone]   03/08/2022, 6:21 PM  LOS: 1 day

## 2022-03-08 NOTE — Plan of Care (Signed)

## 2022-03-09 DIAGNOSIS — N1831 Chronic kidney disease, stage 3a: Secondary | ICD-10-CM | POA: Diagnosis not present

## 2022-03-09 DIAGNOSIS — I10 Essential (primary) hypertension: Secondary | ICD-10-CM | POA: Diagnosis not present

## 2022-03-09 DIAGNOSIS — N3000 Acute cystitis without hematuria: Secondary | ICD-10-CM | POA: Diagnosis not present

## 2022-03-09 LAB — CBC
HCT: 32.8 % — ABNORMAL LOW (ref 36.0–46.0)
Hemoglobin: 10.4 g/dL — ABNORMAL LOW (ref 12.0–15.0)
MCH: 30.4 pg (ref 26.0–34.0)
MCHC: 31.7 g/dL (ref 30.0–36.0)
MCV: 95.9 fL (ref 80.0–100.0)
Platelets: 120 10*3/uL — ABNORMAL LOW (ref 150–400)
RBC: 3.42 MIL/uL — ABNORMAL LOW (ref 3.87–5.11)
RDW: 14.3 % (ref 11.5–15.5)
WBC: 7.6 10*3/uL (ref 4.0–10.5)
nRBC: 0 % (ref 0.0–0.2)

## 2022-03-09 LAB — BASIC METABOLIC PANEL
Anion gap: 6 (ref 5–15)
BUN: 26 mg/dL — ABNORMAL HIGH (ref 8–23)
CO2: 24 mmol/L (ref 22–32)
Calcium: 8.7 mg/dL — ABNORMAL LOW (ref 8.9–10.3)
Chloride: 108 mmol/L (ref 98–111)
Creatinine, Ser: 1.13 mg/dL — ABNORMAL HIGH (ref 0.44–1.00)
GFR, Estimated: 48 mL/min — ABNORMAL LOW (ref >60)
Glucose, Bld: 101 mg/dL — ABNORMAL HIGH (ref 70–99)
Potassium: 3.7 mmol/L (ref 3.5–5.1)
Sodium: 138 mmol/L (ref 135–145)

## 2022-03-09 NOTE — Progress Notes (Signed)
[facility]  PROGRESS NOTE  Madeline Jimenez [id number] DOB: 11-21-1934 DOA: 03/07/2022 PCP: Laneta Simmers, NP (Inactive)   Brief HPI:   87 year old female with medical history of COPD, hypertension, nocturnal supplemental oxygen requirement came to ED with fatigue, increased urinary frequency and new urinary incontinence.  Also having 2-3 loose stool every day. In the ED she was found to be febrile with temperature 38 C, O2 sats 87 to 89% on room air.  Lactic acid was 2.1 normalized to 1.0 without fluids.    Subjective   Patient seen and examined, diarrhea has improved.   Assessment/Plan:    UTI -Presented with fever, fatigue, urinary symptoms -Found to be febrile with pyuria and bacteriuria -Started on IV Rocephin -Urine culture showed no growth -We will discontinue IV Rocephin  Prolonged QT interval -QTc 524 ms in the ED -Serum potassium is replaced  COPD/chronic hypoxemic respiratory failure -Typically uses supplemental oxygen at night -Not in exacerbation -Continue bronchodilators  CKD stage IIIa -Creatinine improved to 1.13  Hypertension -Continue Norvasc  Daily alcohol use -Drinks 3 beers every night -Monitor with CIWA protocol -Continue supplemental B vitamins  Diarrhea -Resolved  Medications     amLODipine  5 mg Oral Daily   enoxaparin (LOVENOX) injection  40 mg Subcutaneous Q24H   fluticasone furoate-vilanterol  1 puff Inhalation Daily   And   umeclidinium bromide  1 puff Inhalation Daily   folic acid  1 mg Oral Daily   LORazepam  0-4 mg Oral Q6H   Followed by   Derrill Memo ON 03/10/2022] LORazepam  0-4 mg Oral Q12H   multivitamin with minerals  1 tablet Oral Daily   sodium chloride flush  3 mL Intravenous Q12H   thiamine  100 mg Oral Daily   Or   thiamine  100 mg Intravenous Daily   traZODone  50 mg Oral QHS     Data Reviewed:   CBG:  No results for input(s): GLUCAP in the last 168 hours.  SpO2: 94 % O2 Flow Rate  (L/min): 2 L/min    Vitals:   03/08/22 1821 03/08/22 2152 03/09/22 0634 03/09/22 0846  BP: 133/66 120/68 (!) 150/63   Pulse: 77 72 (!) 59   Resp:  20 16   Temp:  98.7 F (37.1 C) (!) 97.3 F (36.3 C)   TempSrc:  Oral Oral   SpO2:  95% 94% 94%  Weight:      Height:          Data Reviewed:  Basic Metabolic Panel: Recent Labs  Lab 03/07/22 1933 03/08/22 0455 03/09/22 0512  NA 135 137 138  K 3.7 3.4* 3.7  CL 102 103 108  CO2 23 27 24   GLUCOSE 100* 101* 101*  BUN 22 29* 26*  CREATININE 1.15* 1.23* 1.13*  CALCIUM 8.9 8.9 8.7*  MG  --  2.2  --     CBC: Recent Labs  Lab 03/07/22 1933 03/08/22 0455 03/09/22 0512  WBC 8.3 6.9 7.6  NEUTROABS 4.2  --   --   HGB 11.9* 10.6* 10.4*  HCT 36.7 33.0* 32.8*  MCV 95.3 95.1 95.9  PLT 154 120* 120*    LFT Recent Labs  Lab 03/07/22 1933  AST 18  ALT 13  ALKPHOS 65  BILITOT 0.6  PROT 7.2  ALBUMIN 4.1     Antibiotics: Anti-infectives (From admission, onward)    Start     Dose/Rate Route Frequency Ordered Stop   03/08/22 2300  cefTRIAXone (ROCEPHIN) 2 g in  sodium chloride 0.9 % 100 mL IVPB        2 g 200 mL/hr over 30 Minutes Intravenous Every 24 hours 03/07/22 2345 03/15/22 2259   03/07/22 2300  cefTRIAXone (ROCEPHIN) 1 g in sodium chloride 0.9 % 100 mL IVPB        1 g 200 mL/hr over 30 Minutes Intravenous  Once 03/07/22 2254 03/08/22 0009   03/07/22 2300  azithromycin (ZITHROMAX) 500 mg in sodium chloride 0.9 % 250 mL IVPB  Status:  Discontinued        500 mg 250 mL/hr over 60 Minutes Intravenous  Once 03/07/22 2254 03/07/22 2332        DVT prophylaxis: Lovenox  Code Status: Full code  Family Communication: No family at bedside   CONSULTS    Objective    Physical Examination:   General-appears in no acute distress Heart-S1-S2, regular, no murmur auscultated Lungs-clear to auscultation bilaterally, no wheezing or crackles auscultated Abdomen-soft, nontender, no organomegaly Extremities-no  edema in the lower extremities Neuro-alert, oriented x3, no focal deficit noted   Status is: Inpatient: UTI           [place]   [facility] If 7PM-7AM, please contact night-coverage at [email or web address], Office  [phone]   03/09/2022, 2:28 PM  LOS: 2 days

## 2022-03-09 NOTE — Evaluation (Signed)
Physical Therapy Evaluation Patient Details Name: Madeline Jimenez MRN: [id number] DOB: 1935-08-18 Today's Date: 03/09/2022  History of Present Illness  87 year old female with medical history of COPD, hypertension, nocturnal supplemental oxygen requirement came to ED 03/07/22 with fatigue, increased urinary frequency and new urinary incontinence, loose stool, febrile and hypoxia.  Clinical Impression   The patient is seated in recliner, son at   bedside. Son provides info as patient is very HOH.  Patient independent in self ADL's and ambulates in home, usually "furniture walking." [employer] has a RW if needed. Patient resides with  a niece and has 24/7 caregivers. Patient should progress to return home at [facility]. Pt admitted with above diagnosis.  Pt currently with functional limitations due to the deficits listed below (see PT Problem List). Pt will benefit from skilled PT to increase their independence and safety with mobility to allow discharge to the venue listed below.        Recommendations for follow up therapy are one component of a multi-disciplinary discharge planning process, led by the attending physician.  Recommendations may be updated based on patient status, additional functional criteria and insurance authorization.  Follow Up Recommendations No PT follow up    Assistance Recommended at Discharge    Patient can return home with the following  A little help with walking and/or transfers;A little help with bathing/dressing/bathroom;Help with stairs or ramp for entrance;Assistance with cooking/housework;Assist for transportation    Equipment Recommendations None recommended by PT  Recommendations for Other Services       Functional Status Assessment Patient has had a recent decline in their functional status and demonstrates the ability to make significant improvements in function in a reasonable and predictable amount of time.     Precautions / Restrictions  Precautions Precautions: Fall      Mobility  Bed Mobility               General bed mobility comments: in recliner    Transfers Overall transfer level: Needs assistance Equipment used: 1 person hand held assist Transfers: Sit to/from Stand Sit to Stand: Supervision                Ambulation/Gait Ambulation/Gait assistance: Min guard Gait Distance (Feet): 50 Feet Assistive device: 1 person hand held assist Gait Pattern/deviations: Step-through pattern, Drifts right/left, Trunk flexed Gait velocity: decr     General Gait Details: requires some support  in room,  Stairs            Wheelchair Mobility    Modified Rankin (Stroke Patients Only)       Balance Overall balance assessment: Needs assistance   Sitting balance-Leahy Scale: Good     Standing balance support: Single extremity supported Standing balance-Leahy Scale: Fair                               Pertinent Vitals/Pain Pain Assessment Pain Assessment: No/denies pain    Home Living Family/patient expects to be discharged to:: Private residence Living Arrangements: Other relatives Available Help at Discharge: Available 24 hours/day;Family Type of Home: House Home Access: Stairs to enter Entrance Stairs-Rails: None     Home Layout: One level Home Equipment: Agricultural consultant (2 wheels);Grab bars - tub/shower      Prior Function Prior Level of Function : Needs assist       Physical Assist : Mobility (physical) Mobility (physical): Stairs   Mobility Comments: furniture walks in house mostly ADLs Comments: family  provides meals, Independnet in self care     Hand Dominance        Extremity/Trunk Assessment   Upper Extremity Assessment Upper Extremity Assessment: Overall WFL for tasks assessed    Lower Extremity Assessment Lower Extremity Assessment: Overall WFL for tasks assessed    Cervical / Trunk Assessment Cervical / Trunk Assessment: Kyphotic   Communication   Communication: HOH  Cognition Arousal/Alertness: Awake/alert Behavior During Therapy: WFL for tasks assessed/performed Overall Cognitive Status: Within Functional Limits for tasks assessed                                          General Comments      Exercises     Assessment/Plan    PT Assessment Patient needs continued PT services  PT Problem List Decreased strength;Decreased knowledge of precautions;Decreased mobility;Decreased activity tolerance       PT Treatment Interventions DME instruction;Therapeutic activities;Gait training;Therapeutic exercise;Patient/family education;Functional mobility training    PT Goals (Current goals can be found in the Care Plan section)  Acute Rehab PT Goals Patient Stated Goal: return home PT Goal Formulation: With patient/family Time For Goal Achievement: 03/23/22 Potential to Achieve Goals: Good    Frequency Min 3X/week     Co-evaluation               AM-PAC PT "6 Clicks" Mobility  Outcome Measure Help needed turning from your back to your side while in a flat bed without using bedrails?: A Little Help needed moving from lying on your back to sitting on the side of a flat bed without using bedrails?: A Little Help needed moving to and from a bed to a chair (including a wheelchair)?: A Little Help needed standing up from a chair using your arms (e.g., wheelchair or bedside chair)?: A Little Help needed to walk in hospital room?: A Little Help needed climbing 3-5 steps with a railing? : A Lot 6 Click Score: 17    End of Session   Activity Tolerance: Patient tolerated treatment well Patient left: in chair;with call bell/phone within reach;with chair alarm set;with family/visitor present Nurse Communication: Mobility status PT Visit Diagnosis: Unsteadiness on feet (R26.81);Difficulty in walking, not elsewhere classified (R26.2)    Time: 1450-1505 PT Time Calculation (min) (ACUTE ONLY):  15 min   Charges:   PT Evaluation $PT Eval Low Complexity: 1 Low          Blanchard Kelch PT Acute Rehabilitation Services Pager [phone] Office [phone]   Rada Hay 03/09/2022, 3:52 PM

## 2022-03-10 DIAGNOSIS — J449 Chronic obstructive pulmonary disease, unspecified: Secondary | ICD-10-CM | POA: Diagnosis not present

## 2022-03-10 DIAGNOSIS — J189 Pneumonia, unspecified organism: Secondary | ICD-10-CM

## 2022-03-10 DIAGNOSIS — R509 Fever, unspecified: Secondary | ICD-10-CM | POA: Diagnosis not present

## 2022-03-10 DIAGNOSIS — N3000 Acute cystitis without hematuria: Secondary | ICD-10-CM | POA: Diagnosis not present

## 2022-03-10 LAB — CBC
HCT: 31.2 % — ABNORMAL LOW (ref 36.0–46.0)
Hemoglobin: 10.1 g/dL — ABNORMAL LOW (ref 12.0–15.0)
MCH: 30.6 pg (ref 26.0–34.0)
MCHC: 32.4 g/dL (ref 30.0–36.0)
MCV: 94.5 fL (ref 80.0–100.0)
Platelets: 121 10*3/uL — ABNORMAL LOW (ref 150–400)
RBC: 3.3 MIL/uL — ABNORMAL LOW (ref 3.87–5.11)
RDW: 14.2 % (ref 11.5–15.5)
WBC: 6.6 10*3/uL (ref 4.0–10.5)
nRBC: 0 % (ref 0.0–0.2)

## 2022-03-10 LAB — BASIC METABOLIC PANEL
Anion gap: 5 (ref 5–15)
BUN: 24 mg/dL — ABNORMAL HIGH (ref 8–23)
CO2: 26 mmol/L (ref 22–32)
Calcium: 8.9 mg/dL (ref 8.9–10.3)
Chloride: 108 mmol/L (ref 98–111)
Creatinine, Ser: 0.95 mg/dL (ref 0.44–1.00)
GFR, Estimated: 59 mL/min — ABNORMAL LOW (ref >60)
Glucose, Bld: 95 mg/dL (ref 70–99)
Potassium: 3.9 mmol/L (ref 3.5–5.1)
Sodium: 139 mmol/L (ref 135–145)

## 2022-03-10 MED ORDER — THIAMINE HCL 100 MG PO TABS: 100.0000 mg | ORAL_TABLET | Freq: Every day | ORAL | 0 refills | Status: DC

## 2022-03-10 NOTE — Progress Notes (Signed)
Pt's niece arrived to pick up pt. and would not stay to speak with staff.

## 2022-03-10 NOTE — Care Management Important Message (Signed)
Important Message  Patient Details IM Letter given to the Patient. Name: Madeline Jimenez MRN: [id number] Date of Birth: 25-Jan-1935   Medicare Important Message Given:  Yes     Caren Macadam 03/10/2022, 10:05 AM

## 2022-03-10 NOTE — Discharge Summary (Signed)
Physician Discharge Summary   Patient: Madeline Jimenez MRN: [id number] DOB: 1935/04/23  Admit date:     03/07/2022  Discharge date: 03/10/22  Discharge Physician: Oswald Hillock   PCP: Laneta Simmers, NP (Inactive)   Recommendations at discharge:   Follow-up PCP in 1 week  Discharge Diagnoses: Principal Problem:   UTI (urinary tract infection) Active Problems:   Hypertension   COPD (chronic obstructive pulmonary disease) (HCC)   Stage 3a chronic kidney disease (CKD) (HCC)   Prolonged QT interval  Resolved Problems:   * No resolved hospital problems. *  Hospital Course:  87 year old female with medical history of COPD, hypertension, nocturnal supplemental oxygen requirement came to ED with fatigue, increased urinary frequency and new urinary incontinence.  Also having 2-3 loose stool every day. In the ED she was found to be febrile with temperature 38 C, O2 sats 87 to 89% on room air.  Lactic acid was 2.1 normalized to 1.0 without fluids.   Assessment and Plan:  UTI -Presented with fever, fatigue, urinary symptoms -Found to be febrile with pyuria and bacteriuria -Started on IV Rocephin -Urine culture showed no growth -IV Rocephin was discontinued   Prolonged QT interval -QTc 524 ms in the ED -Serum potassium is replaced   COPD/chronic hypoxemic respiratory failure -Typically uses supplemental oxygen at night -Not in exacerbation -Continue bronchodilators   CKD stage IIIa -Creatinine improved to 1.13   Hypertension -Continue Norvasc   Daily alcohol use -Drinks 3 beers every night -Monitor with CIWA protocol -Continue supplemental B vitamins   Diarrhea -Resolved      Consultants:  Procedures performed:  Disposition: Home Diet recommendation:  Discharge Diet Orders (From admission, onward)     Start     Ordered   03/10/22 0000  Diet - low sodium heart healthy        03/10/22 1042           Regular diet DISCHARGE MEDICATION: Allergies as  of 03/10/2022   No Known Allergies      Medication List     STOP taking these medications    HYDROcodone-acetaminophen 5-325 MG tablet Commonly known as: NORCO/VICODIN   labetalol 200 MG tablet Commonly known as: NORMODYNE   losartan 100 MG tablet Commonly known as: COZAAR       TAKE these medications    albuterol 0.63 MG/3ML nebulizer solution Commonly known as: ACCUNEB Take 1 ampule by nebulization every 6 (six) hours as needed for wheezing.   amLODipine 5 MG tablet Commonly known as: NORVASC Take 5 mg by mouth daily.   escitalopram 10 MG tablet Commonly known as: LEXAPRO Take 10 mg by mouth daily.   multivitamin with minerals tablet Take 1 tablet by mouth daily.   sodium chloride 1 g tablet Take 1 tablet (1 g total) by mouth 2 (two) times daily with a meal.   thiamine 100 MG tablet Take 1 tablet (100 mg total) by mouth daily. Start taking on: Mar 11, 2022   traZODone 50 MG tablet Commonly known as: DESYREL Take 50 mg by mouth daily.   Trelegy Ellipta 100-62.5-25 MCG/ACT Aepb Generic drug: Fluticasone-Umeclidin-Vilant Inhale 1 puff into the lungs daily.   VITAMIN C PO Take 1 tablet by mouth daily.        Discharge Exam: Filed Weights   03/08/22 0055  Weight: 60.2 kg   General-appears in no acute distress Heart-S1-S2, regular, no murmur auscultated Lungs-clear to auscultation bilaterally, no wheezing or crackles auscultated Abdomen-soft, nontender, no organomegaly Extremities-no edema in  the lower extremities Neuro-alert, oriented x3, no focal deficit noted  Condition at discharge: good  The results of significant diagnostics from this hospitalization (including imaging, microbiology, ancillary and laboratory) are listed below for reference.   Imaging Studies: DG Chest Portable 1 View  Result Date: 03/07/2022 CLINICAL DATA:  Weakness and urinary incontinence. EXAM: PORTABLE CHEST 1 VIEW COMPARISON:  August 07, 2014 FINDINGS: The heart  size and mediastinal contours are within normal limits. Low lung volumes are noted. Mild atelectasis is seen within the bilateral lung bases. There is no evidence of a pleural effusion or pneumothorax. Multiple chronic left-sided rib fractures are seen. IMPRESSION: Low lung volumes with bibasilar atelectasis. Electronically Signed   By: Virgina Norfolk M.D.   On: 03/07/2022 19:53    Microbiology: Results for orders placed or performed during the hospital encounter of 03/07/22  Blood culture (routine x 2)     Status: None (Preliminary result)   Collection Time: 03/07/22  6:35 PM   Specimen: BLOOD  Result Value Ref Range Status   Specimen Description   Final    BLOOD BLOOD RIGHT FOREARM Performed at [facility] [address] [place], [place] [address]    Special Requests   Final    BOTTLES DRAWN AEROBIC AND ANAEROBIC Blood Culture results may not be optimal due to an excessive volume of blood received in culture bottles Performed at [facility] [address], [place], [place] [address]    Culture   Final    NO GROWTH 3 DAYS Performed at [facility], [place] [address], [place], [place] [address]    Report Status PENDING  Incomplete  Blood culture (routine x 2)     Status: None (Preliminary result)   Collection Time: 03/07/22  6:45 PM   Specimen: BLOOD  Result Value Ref Range Status   Specimen Description   Final    BLOOD RIGHT ANTECUBITAL Performed at [facility] [address], [place], [place] [address]    Special Requests   Final    BOTTLES DRAWN AEROBIC AND ANAEROBIC Blood Culture results may not be optimal due to an excessive volume of blood received in culture bottles Performed at [facility] [address] [place], [place] [address]    Culture   Final    NO GROWTH 3 DAYS Performed at [facility], [place] [address] [place], [place] [address]    Report Status PENDING  Incomplete   Urine Culture     Status: None   Collection Time: 03/07/22  8:35 PM   Specimen: Urine, Clean Catch  Result Value Ref Range Status   Specimen Description   Final    URINE, CLEAN CATCH Performed at [facility], [place] [address], [place], [place] [address]    Special Requests   Final    NONE Performed at [facility], [place] [address], [place], [place] [address]    Culture   Final    NO GROWTH Performed at [facility], [place] [address], [place], [place] [address]    Report Status 03/08/2022 FINAL  Final    Labs: CBC: Recent Labs  Lab 03/07/22 1933 03/08/22 0455 03/09/22 0512 03/10/22 0627  WBC 8.3 6.9 7.6 6.6  NEUTROABS 4.2  --   --   --   HGB 11.9* 10.6* 10.4* 10.1*  HCT 36.7 33.0* 32.8* 31.2*  MCV 95.3 95.1 95.9 94.5  PLT 154 120* 120* 121*  Basic Metabolic Panel: Recent Labs  Lab 03/07/22 1933 03/08/22 0455 03/09/22 0512 03/10/22 0627  NA 135 137 138 139  K 3.7 3.4* 3.7 3.9  CL 102 103 108 108  CO2 23 27 24 26   GLUCOSE 100* 101* 101* 95  BUN 22 29* 26* 24*  CREATININE 1.15* 1.23* 1.13* 0.95  CALCIUM 8.9 8.9 8.7* 8.9  MG  --  2.2  --   --    Liver Function Tests: Recent Labs  Lab 03/07/22 1933  AST 18  ALT 13  ALKPHOS 65  BILITOT 0.6  PROT 7.2  ALBUMIN 4.1   CBG: No results for input(s): GLUCAP in the last 168 hours.  Discharge time spent: greater than 30 minutes.  Signed: Oswald Hillock, MD [facility] 03/10/2022

## 2022-03-10 NOTE — Progress Notes (Signed)
Still waiting on family to come pick up patient, I just spoke with niece Darl Pikes again and she has agreed to come pick up the patient since I have not been able to get in touch with her son.

## 2022-03-10 NOTE — Progress Notes (Signed)
Called and left message for patient's niece, Darl Pikes regarding patient's discharge. Will wait to hear back.

## 2022-03-10 NOTE — Progress Notes (Signed)
Attempted to call family again about discharge, went straight to voicemail.

## 2022-03-12 LAB — CULTURE, BLOOD (ROUTINE X 2)
Culture: NO GROWTH
Culture: NO GROWTH

## 2022-09-12 ENCOUNTER — Other Ambulatory Visit: Payer: Self-pay

## 2022-09-12 ENCOUNTER — Emergency Department (HOSPITAL_COMMUNITY): Payer: Medicare Other

## 2022-09-12 ENCOUNTER — Inpatient Hospital Stay (HOSPITAL_COMMUNITY)
Admission: EM | Admit: 2022-09-12 | Discharge: 2022-09-14 | DRG: 177 | Disposition: A | Discharge status: Home Health | Payer: Medicare Other | Attending: Family Medicine | Admitting: Family Medicine

## 2022-09-12 ENCOUNTER — Encounter (HOSPITAL_COMMUNITY): Payer: Self-pay

## 2022-09-12 DIAGNOSIS — E785 Hyperlipidemia, unspecified: Secondary | ICD-10-CM | POA: Diagnosis not present

## 2022-09-12 DIAGNOSIS — J441 Chronic obstructive pulmonary disease with (acute) exacerbation: Secondary | ICD-10-CM

## 2022-09-12 DIAGNOSIS — J9621 Acute and chronic respiratory failure with hypoxia: Secondary | ICD-10-CM

## 2022-09-12 DIAGNOSIS — A419 Sepsis, unspecified organism: Secondary | ICD-10-CM | POA: Diagnosis not present

## 2022-09-12 DIAGNOSIS — J9600 Acute respiratory failure, unspecified whether with hypoxia or hypercapnia: Secondary | ICD-10-CM | POA: Diagnosis present

## 2022-09-12 DIAGNOSIS — J9620 Acute and chronic respiratory failure, unspecified whether with hypoxia or hypercapnia: Secondary | ICD-10-CM | POA: Diagnosis present

## 2022-09-12 DIAGNOSIS — R9431 Abnormal electrocardiogram [ECG] [EKG]: Secondary | ICD-10-CM | POA: Diagnosis present

## 2022-09-12 DIAGNOSIS — N189 Chronic kidney disease, unspecified: Secondary | ICD-10-CM

## 2022-09-12 DIAGNOSIS — R6521 Severe sepsis with septic shock: Secondary | ICD-10-CM | POA: Diagnosis not present

## 2022-09-12 DIAGNOSIS — Z8249 Family history of ischemic heart disease and other diseases of the circulatory system: Secondary | ICD-10-CM

## 2022-09-12 DIAGNOSIS — N183 Chronic kidney disease, stage 3 unspecified: Secondary | ICD-10-CM | POA: Diagnosis not present

## 2022-09-12 DIAGNOSIS — R7881 Bacteremia: Secondary | ICD-10-CM | POA: Diagnosis not present

## 2022-09-12 DIAGNOSIS — N179 Acute kidney failure, unspecified: Secondary | ICD-10-CM | POA: Diagnosis present

## 2022-09-12 DIAGNOSIS — B9562 Methicillin resistant Staphylococcus aureus infection as the cause of diseases classified elsewhere: Secondary | ICD-10-CM | POA: Diagnosis not present

## 2022-09-12 DIAGNOSIS — U071 COVID-19: Principal | ICD-10-CM | POA: Insufficient documentation

## 2022-09-12 DIAGNOSIS — W19XXXA Unspecified fall, initial encounter: Secondary | ICD-10-CM | POA: Diagnosis present

## 2022-09-12 DIAGNOSIS — I959 Hypotension, unspecified: Secondary | ICD-10-CM | POA: Diagnosis not present

## 2022-09-12 DIAGNOSIS — J9601 Acute respiratory failure with hypoxia: Secondary | ICD-10-CM | POA: Diagnosis not present

## 2022-09-12 DIAGNOSIS — Z9981 Dependence on supplemental oxygen: Secondary | ICD-10-CM

## 2022-09-12 DIAGNOSIS — I2699 Other pulmonary embolism without acute cor pulmonale: Secondary | ICD-10-CM | POA: Diagnosis not present

## 2022-09-12 DIAGNOSIS — A4102 Sepsis due to Methicillin resistant Staphylococcus aureus: Secondary | ICD-10-CM | POA: Diagnosis not present

## 2022-09-12 DIAGNOSIS — L02413 Cutaneous abscess of right upper limb: Secondary | ICD-10-CM | POA: Diagnosis not present

## 2022-09-12 DIAGNOSIS — L02414 Cutaneous abscess of left upper limb: Secondary | ICD-10-CM | POA: Diagnosis not present

## 2022-09-12 DIAGNOSIS — I129 Hypertensive chronic kidney disease with stage 1 through stage 4 chronic kidney disease, or unspecified chronic kidney disease: Secondary | ICD-10-CM | POA: Diagnosis not present

## 2022-09-12 DIAGNOSIS — E860 Dehydration: Secondary | ICD-10-CM | POA: Diagnosis present

## 2022-09-12 DIAGNOSIS — I10 Essential (primary) hypertension: Secondary | ICD-10-CM | POA: Diagnosis present

## 2022-09-12 DIAGNOSIS — J449 Chronic obstructive pulmonary disease, unspecified: Secondary | ICD-10-CM | POA: Diagnosis not present

## 2022-09-12 DIAGNOSIS — Z87891 Personal history of nicotine dependence: Secondary | ICD-10-CM | POA: Diagnosis not present

## 2022-09-12 DIAGNOSIS — J189 Pneumonia, unspecified organism: Secondary | ICD-10-CM | POA: Diagnosis not present

## 2022-09-12 LAB — CBC WITH DIFFERENTIAL/PLATELET
Abs Immature Granulocytes: 0.27 10*3/uL — ABNORMAL HIGH (ref 0.00–0.07)
Basophils Absolute: 0 10*3/uL (ref 0.0–0.1)
Basophils Relative: 0 %
Eosinophils Absolute: 0 10*3/uL (ref 0.0–0.5)
Eosinophils Relative: 0 %
HCT: 34.9 % — ABNORMAL LOW (ref 36.0–46.0)
Hemoglobin: 11.2 g/dL — ABNORMAL LOW (ref 12.0–15.0)
Immature Granulocytes: 3 %
Lymphocytes Relative: 16 %
Lymphs Abs: 1.7 10*3/uL (ref 0.7–4.0)
MCH: 30.7 pg (ref 26.0–34.0)
MCHC: 32.1 g/dL (ref 30.0–36.0)
MCV: 95.6 fL (ref 80.0–100.0)
Monocytes Absolute: 1.6 10*3/uL — ABNORMAL HIGH (ref 0.1–1.0)
Monocytes Relative: 15 %
Neutro Abs: 6.9 10*3/uL (ref 1.7–7.7)
Neutrophils Relative %: 66 %
Platelets: 125 10*3/uL — ABNORMAL LOW (ref 150–400)
RBC: 3.65 MIL/uL — ABNORMAL LOW (ref 3.87–5.11)
RDW: 14 % (ref 11.5–15.5)
WBC: 10.5 10*3/uL (ref 4.0–10.5)
nRBC: 0 % (ref 0.0–0.2)

## 2022-09-12 LAB — I-STAT VENOUS BLOOD GAS, ED
Acid-Base Excess: 3 mmol/L — ABNORMAL HIGH (ref 0.0–2.0)
Bicarbonate: 26.9 mmol/L (ref 20.0–28.0)
Calcium, Ion: 1.15 mmol/L (ref 1.15–1.40)
HCT: 31 % — ABNORMAL LOW (ref 36.0–46.0)
Hemoglobin: 10.5 g/dL — ABNORMAL LOW (ref 12.0–15.0)
O2 Saturation: 97 %
Potassium: 3.9 mmol/L (ref 3.5–5.1)
Sodium: 136 mmol/L (ref 135–145)
TCO2: 28 mmol/L (ref 22–32)
pCO2, Ven: 39 mmHg — ABNORMAL LOW (ref 44–60)
pH, Ven: 7.447 — ABNORMAL HIGH (ref 7.25–7.43)
pO2, Ven: 88 mmHg — ABNORMAL HIGH (ref 32–45)

## 2022-09-12 LAB — COMPREHENSIVE METABOLIC PANEL
ALT: 16 U/L (ref 0–44)
AST: 22 U/L (ref 15–41)
Albumin: 3.4 g/dL — ABNORMAL LOW (ref 3.5–5.0)
Alkaline Phosphatase: 60 U/L (ref 38–126)
Anion gap: 10 (ref 5–15)
BUN: 23 mg/dL (ref 8–23)
CO2: 25 mmol/L (ref 22–32)
Calcium: 8.8 mg/dL — ABNORMAL LOW (ref 8.9–10.3)
Chloride: 101 mmol/L (ref 98–111)
Creatinine, Ser: 1.41 mg/dL — ABNORMAL HIGH (ref 0.44–1.00)
GFR, Estimated: 37 mL/min — ABNORMAL LOW (ref >60)
Glucose, Bld: 226 mg/dL — ABNORMAL HIGH (ref 70–99)
Potassium: 3.6 mmol/L (ref 3.5–5.1)
Sodium: 136 mmol/L (ref 135–145)
Total Bilirubin: 0.7 mg/dL (ref 0.3–1.2)
Total Protein: 6.1 g/dL — ABNORMAL LOW (ref 6.5–8.1)

## 2022-09-12 LAB — MAGNESIUM: Magnesium: 1.8 mg/dL (ref 1.7–2.4)

## 2022-09-12 LAB — LACTIC ACID, PLASMA
Lactic Acid, Venous: 2.2 mmol/L (ref 0.5–1.9)
Lactic Acid, Venous: 0.9 mmol/L (ref 0.5–1.9)

## 2022-09-12 LAB — PROTIME-INR
INR: 1.3 — ABNORMAL HIGH (ref 0.8–1.2)
Prothrombin Time: 16 seconds — ABNORMAL HIGH (ref 11.4–15.2)

## 2022-09-12 LAB — BRAIN NATRIURETIC PEPTIDE: B Natriuretic Peptide: 383 pg/mL — ABNORMAL HIGH (ref 0.0–100.0)

## 2022-09-12 LAB — TROPONIN I (HIGH SENSITIVITY): Troponin I (High Sensitivity): 18 ng/L — ABNORMAL HIGH (ref <18)

## 2022-09-12 LAB — RESP PANEL BY RT-PCR (FLU A&B, COVID) ARPGX2
Influenza A by PCR: NEGATIVE
Influenza B by PCR: NEGATIVE
SARS Coronavirus 2 by RT PCR: POSITIVE — AB

## 2022-09-12 LAB — APTT: aPTT: 32 seconds (ref 24–36)

## 2022-09-12 MED ORDER — ACETAMINOPHEN 650 MG RE SUPP: 650.0000 mg | Freq: Four times a day (QID) | RECTAL | Status: DC | PRN

## 2022-09-12 MED ORDER — ENOXAPARIN SODIUM 30 MG/0.3ML IJ SOSY
30.0000 mg | PREFILLED_SYRINGE | INTRAMUSCULAR | Status: DC
  Administered 2022-09-12 – 2022-09-14 (×3): 30 mg via SUBCUTANEOUS
  Filled 2022-09-12 (×2): qty 0.3

## 2022-09-12 MED ORDER — METHYLPREDNISOLONE SODIUM SUCC 40 MG IJ SOLR
40.0000 mg | Freq: Every day | INTRAMUSCULAR | Status: DC
  Filled 2022-09-12: qty 1

## 2022-09-12 MED ORDER — LACTATED RINGERS IV BOLUS (SEPSIS)
1000.0000 mL | Freq: Once | INTRAVENOUS | Status: AC
  Administered 2022-09-12: 1000 mL via INTRAVENOUS

## 2022-09-12 MED ORDER — POLYMYXIN B-TRIMETHOPRIM 10000-0.1 UNIT/ML-% OP SOLN
1.0000 [drp] | OPHTHALMIC | Status: DC
  Administered 2022-09-12 – 2022-09-14 (×11): 1 [drp] via OPHTHALMIC
  Filled 2022-09-12 (×2): qty 10

## 2022-09-12 MED ORDER — MAGNESIUM SULFATE IN D5W 1-5 GM/100ML-% IV SOLN
1.0000 g | Freq: Once | INTRAVENOUS | Status: AC
  Administered 2022-09-12: 1 g via INTRAVENOUS
  Filled 2022-09-12: qty 100

## 2022-09-12 MED ORDER — MOLNUPIRAVIR EUA 200MG CAPSULE
4.0000 | ORAL_CAPSULE | Freq: Two times a day (BID) | ORAL | Status: DC
  Administered 2022-09-12 – 2022-09-14 (×4): 800 mg via ORAL
  Filled 2022-09-12 (×3): qty 4

## 2022-09-12 MED ORDER — ACETAMINOPHEN 325 MG PO TABS
650.0000 mg | ORAL_TABLET | Freq: Four times a day (QID) | ORAL | Status: DC | PRN
  Administered 2022-09-12 – 2022-09-14 (×3): 650 mg via ORAL
  Filled 2022-09-12 (×3): qty 2

## 2022-09-12 MED ORDER — LIDOCAINE 5 % EX PTCH
1.0000 | MEDICATED_PATCH | Freq: Every day | CUTANEOUS | Status: DC | PRN
  Administered 2022-09-12: 1 via TRANSDERMAL
  Filled 2022-09-12: qty 1

## 2022-09-12 MED ORDER — METHYLPREDNISOLONE SODIUM SUCC 125 MG IJ SOLR
125.0000 mg | Freq: Once | INTRAMUSCULAR | Status: AC
  Administered 2022-09-12: 125 mg via INTRAVENOUS
  Filled 2022-09-12: qty 2

## 2022-09-12 MED ORDER — SODIUM CHLORIDE 0.9 % IV SOLN: INTRAVENOUS | Status: DC

## 2022-09-12 MED ORDER — IPRATROPIUM-ALBUTEROL 0.5-2.5 (3) MG/3ML IN SOLN
3.0000 mL | RESPIRATORY_TRACT | Status: AC
  Administered 2022-09-12 (×3): 3 mL via RESPIRATORY_TRACT
  Filled 2022-09-12: qty 3
  Filled 2022-09-12: qty 6
  Filled 2022-09-12: qty 3

## 2022-09-12 MED ORDER — IPRATROPIUM-ALBUTEROL 0.5-2.5 (3) MG/3ML IN SOLN
3.0000 mL | Freq: Four times a day (QID) | RESPIRATORY_TRACT | Status: DC | PRN
  Administered 2022-09-12: 3 mL via RESPIRATORY_TRACT
  Filled 2022-09-12: qty 3

## 2022-09-12 NOTE — Assessment & Plan Note (Addendum)
Creatinine 1.41>1.21.  Baseline appears to be ~1.0, but carries diagnosis of CKD3.   - mIVF NS 75 mL/h - Regular diet, PO as tolerated - Daily BMP

## 2022-09-12 NOTE — Hospital Course (Addendum)
Madeline Jimenez is a 87 y.o. female presenting for SOB and URI symptoms for several days. Her past medical history is significant for COPD, HTN, HLD. Her hospital course is outlined below:    Acute on chronic respiratory failure  COVID infection  COPD: Patient admitted due to increased work of breathing from her baseline oxygen of 2 L at home.  She reports having upper respiratory symptoms for the past 3 days.  COVID test positive.  CXR did not show pneumonia.  She was started on molnupiravir and Decadron to treat COVID infection and support her COPD.  She was maintained on 2 L with goal O2 saturation 88-92%.  At discharge she was continued on molnupiravir ending 12/2 and Decadron ending 12/8.   AKI: Creatinine admission 1.41.  Baseline appears to be around 1.0.  She was treated with IV fluids and at discharge her creatinine was 0.93.  Prolonged QTc interval: EKG on admission showed QTc at 598.  Repeat EKG showed 466.    PCP follow-up: Follow up respiratory status s/p COVID infection.  Follow up on COPD maintenance therapy.

## 2022-09-12 NOTE — Progress Notes (Signed)
FMTS Interim Progress Note  S: Patient seen at bedside with Dr. Marsh Dolly.  Resting comfortably in bed sleeping. She put her hearing aids in to talk with [place].  Denied any complaints.  O: BP (!) 146/53 (BP Location: Right Arm)   Pulse 61   Temp 98.9 F (37.2 C) (Oral)   Resp 20   Ht 5\' 4"  (1.626 m)   Wt 59 kg   SpO2 99%   BMI 22.31 kg/m   General: Ill-appearing elderly female in bed CV: Regular rate and rhythm Respiratory: Slightly increased work of breathing on 4 L nasal cannula.  Rhonchorous and coarse but breath sounds throughout, but decent air movement.  No respiratory distress.No wheezing or crackles.   A/P: Acute on chronic respiratory failure likely secondary to COVID +/- COPD Stable.  Afebrile.  On 4 L nasal cannula.  On molnupiravir and Solu-Medrol. - Change steroids to covid dosing decadron  -Continue as needed DuoNebs -Supplemental oxygen with goal 88-92% Will adjust bounds on O2 order and discuss decreasing oxygen with nurse  - Added strict I&O to help evaluate fluid status   Prolonged QT interval -Will follow-up morning EKG and morning BMP  Hypotension BP much improved since admission.  Most recent pressure 146/53 -Continue to hold amlodipine, likely restart in the morning  , DO 09/12/2022, 10:10 PM PGY-2, [facility] Family Medicine Service pager [phone]

## 2022-09-12 NOTE — H&P (Addendum)
Hospital Admission History and Physical Service Pager: [phone]  Patient name: Madeline Jimenez Medical record number: [id number] Date of Birth: 1935-05-27 Age: 87 y.o. Gender: female  Primary Care Provider: Evie Lacks, NP (Inactive) Consultants: None Code Status: Full code Preferred Emergency Contact:  Contact Information     Name Relation Home Work Mobile   Reberg,Susan Niece [phone]  [phone]   Toshika, [place] [phone]  [phone]        Chief Complaint: Fall and SOB  Assessment and Plan: Madeline Jimenez is a 87 y.o. female presenting with fall and respiratory distress likely in the setting of COVID infection. Other possible causes include PNA (less likely given no focal findings on exam and absence of leukocytosis), COPD exacerbation (absence of copious sputum production or use of tobacco product, no wheezing on exam).  * Acute on chronic respiratory failure (HCC) Presenting w/ 5 day hx of SOB, increased O2 use (baseline 2L @ night, now needing 2L all day), fever, and decreased PO. Reports using at home albuterol nebulizer, but unsure if this was helpful. Son had "cold" at Thanksgiving, day before symptom onset. Found to be Covid positive. CXR showed BL small pleural eff. Exam notable for diffuse rhonchi. No leukocytosis. Differential includes COVID infection, COPD exacerbation, and PNA. COVID infection is supported by fever, increased O2 need, diffuse lung rhonchi, and covid positive infection. COPD is less likely given lack of wheezing. PNA is less likely given lack of focal lung sounds of focal imaging findings, lack of white count. Will treat with steroid and molnupiravir. Will defer antibiotics at this time given lower suspicion for bacterial etiology. - Admit to FMTS, attending Dr. Lum Babe  - med-surg, Vital signs per floor - Solumedrol 40mg  IV daily for 3 days (consider extending to 5 days if resp status not improving) - regular diet  - PT/OT  to treat - VTE prophylaxis Lovenox - AM CBC/BMP - Follow-up blood culture - Fall precautions - On 2L [place], maintain sats 88-92% - COVID precautions  COVID Fever and cough beginning 11/24. Meets criteria for covid treatment with molnupiravir. - Tylenol prn for fever - Start molnupiravir (11/28 - 12/2) - Solumedrol (as above)  Acute kidney injury superimposed on chronic kidney disease (HCC) Creatinine 1.41.  Baseline appears to be ~1.0, but carries diagnosis of CKD3.  Most likely due to dehydration 2/2 to COVID infection. - S/p 1 L bolus fluid - mIVF NS 75 mL/h - Regular diet, PO as tolerated - Daily BMP  COPD (chronic obstructive pulmonary disease) (HCC) Pt unsure if she takes Trelegy at home. Does report using Albuterol neb BID. Baseline 2L O2 at night - prn Duonebs - Cont Trelegy - Continuous pulse ox  Prolonged QT interval EKG showing prolonged Qtc 598, historical EKG review seems QTc is normally prolonged. - Avoid QTc prolonging medications - Repeat EKG in the morning - Check BMP and Mag daily, K >4, Mag >2   Hypertension Patient hypotensive in the ED. Home medications include amlodipine 5 mg. - Holding amlo given soft BP on admission - Reassess blood pressure to restart medications tomorrow    FEN/GI: Regular diet VTE Prophylaxis: Lovenox  Disposition: Med Surg  History of Present Illness:  Madeline Jimenez is a 87 y.o. female presenting with a fall. Reports falling on her knees yesterday and on her back this morning. Denies hitting her head. Reports leg just gave out and usually use a walker at baseline. Denies vision changes or dizziness. Denies LOC.   In addition patient  reports worsening SOB that started last Friday.  Also endorse sore throat and worsening dry cough similar to normal COPD cough, but increased in frequency. Denies congestion, running nose, sneezing. Had subjective fever today. Usually use O2 nightly (2L) but in the last couple of days needing it  during the day. Also endorses decrease in appetite and eating but denies nausea, vomiting, diarrhea or constipation. Of note, her son had a "cold" this Thanksgiving and then pt started feeling sick the next day.   Pt BIB EMS for fall this AM, found to have temp of 101.1 w/ EMS. In the ED, was tachypneic but no longer febrile.  Found to be COVID-positive.  Mildly elevated lactic acid.  CMP notable for AKI.  CBC without leukocytosis.  CXR showed small bilateral pleural effusion.  Received 1L LR and DuoNeb x 3.  Lactic acid subsequently down trended to normal.  Pertinent Past Medical History: COPD, HTN, HLD Remainder reviewed in history tab.   Pertinent Past Surgical History: Abdominal hysterectomy Remainder reviewed in history tab.  Pertinent Social History: Tobacco use:No Alcohol use: 3 reg beers a day Other Substance use: None Lives with Niece, her husband and 2 kids  Pertinent Family History: Father: heart attack, HTN Sister: Heart attack  Remainder reviewed in history tab.   Important Outpatient Medications: Amilodipine Albuterol Ascorbic Acid Lexapro 10mg  daily Thiamine 100mg  daily Trazodone 50mg  daily Trelegy Elliptal 1 puff daily Albuterol Neb PRN Remainder reviewed in medication history.   Objective: BP 98/64   Pulse 95   Temp (!) 101.2 F (38.4 C) (Oral)   Resp 16   Ht 5\' 4"  (1.626 m)   Wt 59 kg   SpO2 92%   BMI 22.31 kg/m  Exam: General: Pleasant, laying in bed. Alert. Difficulty with hearing, only has L ear hearing aid in. HEENT: Dry mucous membranes.  NCAT. Cardiovascular: RRR, no murmurs. 2+ radial pulse Respiratory: Diffusely rhonchorous, especially in bilateral lung bases.  No wheezing, crackles. On 2L [place].  Gastrointestinal: Soft, nontender, nondistended. Normal BS. Ext: No pitting edema  Labs:  CBC BMET  Recent Labs  Lab 09/12/22 1110 09/12/22 1434  WBC 10.5  --   HGB 11.2* 10.5*  HCT 34.9* 31.0*  PLT 125*  --    Recent Labs  Lab  09/12/22 1110 09/12/22 1434  NA 136 136  K 3.6 3.9  CL 101  --   CO2 25  --   BUN 23  --   CREATININE 1.41*  --   GLUCOSE 226*  --   CALCIUM 8.8*  --      EKG: NSR, prolonged QTc  Imaging Studies Performed:  CXR My Interpretation: In addition to radiology read, the L pleural eff and cardiac silhouette could be obscuring a potential L lower lung field consolidation.   09/14/22, MD 09/12/2022, 4:42 PM PGY-1, [facility]  I was personally present and re-performed the exam. I verified the service and findings are accurately documented in the intern's note. My changes made within intern's note.  09/14/22, MD 09/12/2022 5:31 PM   FPTS Intern pager: [phone], text pages welcome Secure chat group [facility] Teaching Service

## 2022-09-12 NOTE — Assessment & Plan Note (Addendum)
Fever and cough beginning 11/24. Meets criteria for covid treatment with molnupiravir. -Tylenol prn for fever - Start molnupiravir (11/28 - 12/2)

## 2022-09-12 NOTE — Assessment & Plan Note (Addendum)
Pt unsure if she takes Trelegy at home. Does report using Albuterol neb BID. Baseline 2L O2 at night - prn Duonebs - Cont Trelegy - Continuous pulse ox

## 2022-09-12 NOTE — H&P (Signed)
I have evaluated this patient and discussed management plan with the residents. I will cosign their note once it is done.

## 2022-09-12 NOTE — ED Triage Notes (Signed)
Pt BIBA from Home for fall. Denies injury from fall. Pt then reports SOB, fever and cough since 11/24. Hx of PNA and COPD. 2L baseline. TEMP 101 with EMS.  HR -76 94% 2L [place] 100/62 RR- 18 CBG-213 101.1 Tympanic

## 2022-09-12 NOTE — Assessment & Plan Note (Addendum)
EKG showing prolonged Qtc 598, historical EKG review seems QTc is normally prolonged. - Avoid QTc prolonging medications - Repeat EKG in the morning - Check BMP and Mag daily, K >4, Mag >2

## 2022-09-12 NOTE — Assessment & Plan Note (Addendum)
Patient hypotensive in the ED. Home medications include amlodipine 5 mg. - Holding amlo given soft BP on admission - Reassess blood pressure to restart medications tomorrow

## 2022-09-12 NOTE — Assessment & Plan Note (Addendum)
Work of breathing normal. Still diffusely rhonchorous on exam. O2 maintained on home 2L oxygen.  - Decadron 6 mg for 10 days - Molnupiravir 5-day course - Blood culture: no growth  - On 2L [place], maintain sats 88-92% - COVID precautions - Consider repeat CXR if symptoms worsen

## 2022-09-12 NOTE — ED Provider Notes (Signed)
[facility] EMERGENCY DEPARTMENT Provider Note   CSN: [id number] Arrival date & time: 09/12/22  1042     History  Chief Complaint  Patient presents with   Shortness of Breath    Madeline Jimenez is a 87 y.o. female brought to ED from home by EMS.  EMS was called out for a fall, patient denies any injury from the fall but does report shortness of breath, fever, and cough since 11/24.  She has history pertinent for pneumonia and COPD.  She is oxygen dependent, 2L at baseline.  Temp was 101.1 tympanic, CBG 213, blood pressure 100/62, heart rate 76 with EMS.  Patient reports a productive cough and increased urinary frequency also.  Fever at home was subjective, patient did not take her temperature.  She has not used any medicines at home to alleviate her symptoms.  Denies syncope, lightheadedness, chest pain, palpitations, chest tightness.  Denies taking blood thinners.        Home Medications Prior to Admission medications   Medication Sig Start Date End Date Taking? Authorizing Provider  albuterol (ACCUNEB) 0.63 MG/3ML nebulizer solution Take 1 ampule by nebulization every 6 (six) hours as needed for wheezing.    [provider]  amLODipine (NORVASC) 5 MG tablet Take 5 mg by mouth daily. 03/06/22   [provider]  Ascorbic Acid (VITAMIN C PO) Take 1 tablet by mouth daily.    [provider]  escitalopram (LEXAPRO) 10 MG tablet Take 10 mg by mouth daily. 01/26/22   [provider]  Multiple Vitamins-Minerals (MULTIVITAMIN WITH MINERALS) tablet Take 1 tablet by mouth daily.    [provider]  sodium chloride 1 G tablet Take 1 tablet (1 g total) by mouth 2 (two) times daily with a meal. Patient not taking: Reported on 03/07/2022 08/09/14   Francesca Oman, DO  thiamine 100 MG tablet Take 1 tablet (100 mg total) by mouth daily. 03/11/22   Oswald Hillock, MD  traZODone (DESYREL) 50 MG tablet Take 50 mg by mouth daily. 12/14/21    [provider]  TRELEGY ELLIPTA 100-62.5-25 MCG/ACT AEPB Inhale 1 puff into the lungs daily. 02/13/22   [provider]      Allergies    Patient has no known allergies.    Review of Systems   Review of Systems  Constitutional:  Positive for chills and fever.  Respiratory:  Positive for cough and shortness of breath. Negative for chest tightness.   Cardiovascular:  Negative for chest pain and palpitations.  Gastrointestinal:  Negative for diarrhea, nausea and vomiting.  Genitourinary:  Positive for frequency.  Neurological:  Positive for weakness. Negative for syncope and light-headedness.  Psychiatric/Behavioral:  Negative for confusion.     Physical Exam Updated Vital Signs BP (!) 106/54   Pulse (!) 58   Temp 99.1 F (37.3 C) (Oral)   Resp (!) 22   SpO2 99%  Physical Exam Vitals and nursing note reviewed.  Constitutional:      General: She is not in acute distress.    Appearance: She is ill-appearing. She is not toxic-appearing.  HENT:     Head: Normocephalic and atraumatic.     Mouth/Throat:     Mouth: Mucous membranes are moist.     Pharynx: Oropharynx is clear.  Eyes:     General:        Right eye: Discharge present.        Left eye: Discharge present.    Comments: Yellow crusting  appreciated bilaterally in eyelashes; mild erythema bilaterally  Cardiovascular:     Rate and Rhythm: Normal rate and regular rhythm.     Pulses: Normal pulses.     Heart sounds: Normal heart sounds.  Pulmonary:     Effort: Pulmonary effort is normal. No respiratory distress.     Breath sounds: Normal air entry. Decreased breath sounds and rhonchi present.     Comments: Significant rhonchi appreciated in all fields, patient coughs on exam with deep inspiration Abdominal:     General: Abdomen is flat. Bowel sounds are normal. There is no distension.     Palpations: Abdomen is soft.     Tenderness: There is no abdominal tenderness.  Musculoskeletal:     Cervical  back: Full passive range of motion without pain and normal range of motion. No crepitus. No pain with movement, spinous process tenderness or muscular tenderness.     Right lower leg: No edema.     Left lower leg: No edema.  Skin:    General: Skin is warm and dry.     Capillary Refill: Capillary refill takes less than 2 seconds.     Coloration: Skin is not cyanotic or pale.  Neurological:     Mental Status: She is alert and oriented to person, place, and time. Mental status is at baseline.     Comments: Patient is hard of hearing, wears hearing aids, is able to answer all questions appropriately, but does require repeating of questions due to not hearing well  Psychiatric:        Mood and Affect: Mood normal.        Behavior: Behavior normal.     ED Results / Procedures / Treatments   Labs (all labs ordered are listed, but only abnormal results are displayed) Labs Reviewed  RESP PANEL BY RT-PCR (FLU A&B, COVID) ARPGX2 - Abnormal; Notable for the following components:      Result Value   SARS Coronavirus 2 by RT PCR POSITIVE (*)    All other components within normal limits  LACTIC ACID, PLASMA - Abnormal; Notable for the following components:   Lactic Acid, Venous 2.2 (*)    All other components within normal limits  COMPREHENSIVE METABOLIC PANEL - Abnormal; Notable for the following components:   Glucose, Bld 226 (*)    Creatinine, Ser 1.41 (*)    Calcium 8.8 (*)    Total Protein 6.1 (*)    Albumin 3.4 (*)    GFR, Estimated 37 (*)    All other components within normal limits  CBC WITH DIFFERENTIAL/PLATELET - Abnormal; Notable for the following components:   RBC 3.65 (*)    Hemoglobin 11.2 (*)    HCT 34.9 (*)    Platelets 125 (*)    Monocytes Absolute 1.6 (*)    Abs Immature Granulocytes 0.27 (*)    All other components within normal limits  PROTIME-INR - Abnormal; Notable for the following components:   Prothrombin Time 16.0 (*)    INR 1.3 (*)    All other components  within normal limits  I-STAT VENOUS BLOOD GAS, ED - Abnormal; Notable for the following components:   pH, Ven 7.447 (*)    pCO2, Ven 39.0 (*)    pO2, Ven 88 (*)    Acid-Base Excess 3.0 (*)    HCT 31.0 (*)    Hemoglobin 10.5 (*)    All other components within normal limits  CULTURE, BLOOD (ROUTINE X 2)  CULTURE, BLOOD (ROUTINE X 2)  URINE CULTURE  APTT  LACTIC ACID, PLASMA  URINALYSIS, ROUTINE W REFLEX MICROSCOPIC  BLOOD GAS, VENOUS  BRAIN NATRIURETIC PEPTIDE  TROPONIN I (HIGH SENSITIVITY)  TROPONIN I (HIGH SENSITIVITY)    EKG EKG Interpretation  Date/Time:  Tuesday September 12 2022 11:09:34 EST Ventricular Rate:  71 PR Interval:  125 QRS Duration: 107 QT Interval:  550 QTC Calculation: 598 R Axis:   -65 Text Interpretation: Sinus rhythm LAD, consider left anterior fascicular block Borderline ST depression, anterolateral leads Prolonged QT interval Confirmed by Ernie Avena ([id number]) on 09/12/2022 1:12:01 PM  Radiology DG Chest 2 View  Result Date: 09/12/2022 CLINICAL DATA:  Sepsis, fever, cough EXAM: CHEST - 2 VIEW COMPARISON:  03/07/2022 FINDINGS: Lungs are clear. No pneumothorax. Eventration of the right hemidiaphragm noted. Superimposed small bilateral pleural effusions are present. Cardiac size is mildly enlarged, stable. Pulmonary vascularity is normal. No acute bone abnormality. Healed left rib fractures noted. Degenerative changes noted within the right shoulder. Remote T12 compression deformity with moderate loss of height and mild resultant focal kyphosis is noted IMPRESSION: 1. Small bilateral pleural effusions. 2. Mild cardiomegaly. Electronically Signed   By: Helyn Numbers M.D.   On: 09/12/2022 12:07    Procedures .Critical Care  Performed by: Lenard Simmer, PA Authorized by: Lenard Simmer, PA   Critical care provider statement:    Critical care time (minutes):  30   Critical care was necessary to treat or prevent imminent or life-threatening deterioration  of the following conditions:  Respiratory failure   Critical care was time spent personally by me on the following activities:  Development of treatment plan with patient or surrogate, discussions with consultants, evaluation of patient's response to treatment, examination of patient, ordering and review of laboratory studies, ordering and review of radiographic studies, ordering and performing treatments and interventions, pulse oximetry, re-evaluation of patient's condition, review of old charts and obtaining history from patient or surrogate   Care discussed with: admitting provider       Medications Ordered in ED Medications  trimethoprim-polymyxin b (POLYTRIM) ophthalmic solution 1 drop (has no administration in time range)  methylPREDNISolone sodium succinate (SOLU-MEDROL) 125 mg/2 mL injection 125 mg (has no administration in time range)  lactated ringers bolus 1,000 mL (0 mLs Intravenous Stopped 09/12/22 1238)  ipratropium-albuterol (DUONEB) 0.5-2.5 (3) MG/3ML nebulizer solution 3 mL (3 mLs Nebulization Given 09/12/22 1400)    ED Course/ Medical Decision Making/ A&P Clinical Course as of 09/12/22 1500  Tue Sep 12, 2022  1239 ED EKG 12-Lead [MC]  1414 SARS Coronavirus 2 by RT PCR(!): POSITIVE [JL]    Clinical Course User Index [JL] Ernie Avena, MD [MC] Melton Alar R, PA                           Medical Decision Making Problems Addressed: COPD exacerbation [facility]): acute illness or injury COVID-19: acute illness or injury  Amount and/or Complexity of Data Reviewed Labs: ordered. Decision-making details documented in ED Course. Radiology: ordered. ECG/medicine tests: ordered. Decision-making details documented in ED Course.  Risk Prescription drug management. Decision regarding hospitalization.   This patient presents to the ED with chief complaint(s) of dyspnea, subjective fever, cough with pertinent past medical history of COPD, CKD, oxygen dependence, PNA which  further complicates the presenting complaint. The complaint involves an extensive differential diagnosis and treatment options and also carries with it a high risk of complications and morbidity.    The differential diagnosis includes  pneumonia, COVID, influenza, acute exacerbation of COPD, new onset CHF   The initial plan is to order sepsis workup and to treat patient with LR fluid bolus per protocol  Additional history obtained: Additional history obtained from EMS  Records reviewed previous admission documents  Reassessment and review (also see workup area): Lab Tests: I ordered and personally interpreted labs.  The pertinent results include:   Elevated lactic acid at 2.2 CBC reveals microcytic anemia, Hgb 11.2, and thrombocytopenia, plt 125; no evidence of leukocytosis, WBC 10.5 CMP significant for elevated glucose at 226, Cr 1.41, appears to be worse than her baseline, hx of CKD 3a PT INR both elevated at 16.0 and 1.3 respectively  COVID-positive, influenza negative VBG reveals mild alkalosis, pH 7.447, patient is tachypneic  Urinalysis also pending, patient has purewick in place to collect urine  ECG:  I personally reviewed ECG which demonstrates sinus rhythm with prolonged QTc interval of 598 ms and LAD.   Imaging Studies: I ordered and independently visualized and interpreted the following imaging X-ray chest   which showed mild cardiomegaly with bilateral pleural effusions; no evidence of pneumothorax, infiltrates indicative of pneumonia The interpretation of the imaging was limited to assessing for emergent pathology, for which purpose it was ordered.  Initial Assessment:  On exam, patient is A&Ox3, hard of hearing, requires repetition of questions.  Rhonchi appreciated in all fields on auscultation with decreased breath sounds overall.  Heart rate and rhythm are normal.  Abdomen is soft and nontender.  She does report increased urinary frequency.  No bilateral lower extremity  edema appreciated on exam.  She is oxygen dependent and is on nasal cannula at 2L/min with SPO2 94-96%.  Skin is warm and dry.  Bilateral yellow crusting to eyelashes with mild erythematous lids.   Consultations Obtained: I requested consultation with the admitting physician Dr. Markus Jarvis , and discussed  findings as well as pertinent plan - they recommend: admission of patient   Medicines ordered and prescription drug management: I ordered the following medications LR fluid bolus for fluid resuscitation per sepsis protocol  I ordered DuoNeb x3 every 15 minutes for tx of COPD/decreased breath sounds with rhonchi   Reevaluation of the patient after these medicines showed that the patient    stayed the same Reassessment of lung sounds revealed significant rhonchi in all lung fields, no wheezing, patient is still tachypneic with decreased breath sounds despite 3 rounds of DuoNeb  Cardiac Monitoring: The patient was maintained on a cardiac monitor.  I personally viewed and interpreted the cardiac monitor which showed an underlying rhythm of:  sinus rhythm  Complexity of problems addressed: Patient's presentation is most consistent with  severe exacerbation of chronic illness  Disposition: After consideration of the diagnostic results and the patient's response to treatment,  I feel that the patent would benefit from admission for acute exacerbation of COPD in the setting of COVID and AKI .          Final Clinical Impression(s) / ED Diagnoses Final diagnoses:  COPD exacerbation ([place])  COVID-19  AKI (acute kidney injury) [facility])    Rx / DC Orders ED Discharge Orders     None         Pat Kocher, [place] 09/12/22 1501    Regan Lemming, MD 09/12/22 1540

## 2022-09-13 ENCOUNTER — Encounter (HOSPITAL_COMMUNITY): Payer: Self-pay | Admitting: Family Medicine

## 2022-09-13 LAB — CBC
HCT: 29.7 % — ABNORMAL LOW (ref 36.0–46.0)
Hemoglobin: 10 g/dL — ABNORMAL LOW (ref 12.0–15.0)
MCH: 31.3 pg (ref 26.0–34.0)
MCHC: 33.7 g/dL (ref 30.0–36.0)
MCV: 92.8 fL (ref 80.0–100.0)
Platelets: 107 10*3/uL — ABNORMAL LOW (ref 150–400)
RBC: 3.2 MIL/uL — ABNORMAL LOW (ref 3.87–5.11)
RDW: 13.9 % (ref 11.5–15.5)
WBC: 15.5 10*3/uL — ABNORMAL HIGH (ref 4.0–10.5)
nRBC: 0 % (ref 0.0–0.2)

## 2022-09-13 LAB — BASIC METABOLIC PANEL
Anion gap: 7 (ref 5–15)
BUN: 29 mg/dL — ABNORMAL HIGH (ref 8–23)
CO2: 26 mmol/L (ref 22–32)
Calcium: 8.6 mg/dL — ABNORMAL LOW (ref 8.9–10.3)
Chloride: 99 mmol/L (ref 98–111)
Creatinine, Ser: 1.21 mg/dL — ABNORMAL HIGH (ref 0.44–1.00)
GFR, Estimated: 44 mL/min — ABNORMAL LOW (ref >60)
Glucose, Bld: 148 mg/dL — ABNORMAL HIGH (ref 70–99)
Potassium: 3.9 mmol/L (ref 3.5–5.1)
Sodium: 132 mmol/L — ABNORMAL LOW (ref 135–145)

## 2022-09-13 LAB — MAGNESIUM: Magnesium: 2.1 mg/dL (ref 1.7–2.4)

## 2022-09-13 MED ORDER — UMECLIDINIUM BROMIDE 62.5 MCG/ACT IN AEPB
1.0000 | INHALATION_SPRAY | Freq: Every day | RESPIRATORY_TRACT | Status: DC
  Administered 2022-09-13 – 2022-09-14 (×2): 1 via RESPIRATORY_TRACT
  Filled 2022-09-13: qty 7

## 2022-09-13 MED ORDER — AMLODIPINE BESYLATE 5 MG PO TABS: 5.0000 mg | ORAL_TABLET | Freq: Every day | ORAL | Status: DC

## 2022-09-13 MED ORDER — AMLODIPINE BESYLATE 5 MG PO TABS
5.0000 mg | ORAL_TABLET | Freq: Every day | ORAL | Status: DC
  Administered 2022-09-13 – 2022-09-14 (×2): 5 mg via ORAL
  Filled 2022-09-13 (×2): qty 1

## 2022-09-13 MED ORDER — FLUTICASONE FUROATE-VILANTEROL 100-25 MCG/ACT IN AEPB
1.0000 | INHALATION_SPRAY | Freq: Every day | RESPIRATORY_TRACT | Status: DC
  Administered 2022-09-13 – 2022-09-14 (×2): 1 via RESPIRATORY_TRACT
  Filled 2022-09-13: qty 28

## 2022-09-13 MED ORDER — DEXAMETHASONE 6 MG PO TABS
6.0000 mg | ORAL_TABLET | Freq: Every day | ORAL | Status: DC
  Administered 2022-09-13 – 2022-09-14 (×2): 6 mg via ORAL
  Filled 2022-09-13 (×2): qty 1

## 2022-09-13 NOTE — Progress Notes (Signed)
Mobility Specialist Progress Note:   09/13/22 1152  Mobility  Activity Ambulated with assistance in room  Level of Assistance Standby assist, set-up cues, supervision of patient - no hands on  Assistive Device Front wheel walker  Distance Ambulated (ft) 100 ft  Activity Response Tolerated well  Mobility Referral Yes  $Mobility charge 1 Mobility   Pt received in bed and agreeable. No complaints of SOB. Unable to get a good reading on SpO2 throughout session. Pt left in bed with all needs met and call bell in reach.   Andrey Campanile Mobility Specialist Please contact via SecureChat or  Rehab office at [phone]

## 2022-09-13 NOTE — Progress Notes (Signed)
Doctor on duty informed about the  concern of pts  lung sounding coarse with crackles. with increase work of breathing, she was on IVF I had paused same and was given a breathing treatment. Waiting on R/T to reassess. Spo2 is above 95% other vital signs stable . was concern about fluid overload Plan to pause fluid overnight

## 2022-09-13 NOTE — Progress Notes (Addendum)
     Daily Progress Note Intern Pager: [phone]  Patient name: Madeline Jimenez Medical record number: [id number] Date of birth: 1935/05/09 Age: 87 y.o. Gender: female  Primary Care Provider: Evie Lacks, NP (Inactive) Consultants: none Code Status: Full code   Pt Overview and Major Events to Date:  11/28: Admitted   Assessment and Plan:  Madeline Jimenez is a 87 y.o. female presenting with fall and respiratory distress likely in the setting of COVID infection. . Pertinent PMH/PSH includes COPD, hypertension, hyperlipidemia.   * Acute on chronic respiratory failure (HCC) Work of breathing normal. Still diffusely rhonchorous on exam. O2 maintained on home 2L oxygen.  - Decadron 6 mg for 10 days - Molnupiravir 5-day course - Blood culture: no growth  - On 2L [place], maintain sats 88-92% - COVID precautions - Consider repeat CXR if symptoms worsen  COVID-19 Fever and cough beginning 11/24. Meets criteria for covid treatment with molnupiravir. -Tylenol prn for fever - Start molnupiravir (11/28 - 12/2)  Acute kidney injury superimposed on chronic kidney disease (HCC) Creatinine 1.41>1.21.  Baseline appears to be ~1.0, but carries diagnosis of CKD3.   - mIVF NS 75 mL/h - Regular diet, PO as tolerated - Daily BMP  COPD exacerbation (HCC) Breathing stable.  Continues to be diffusely rhonchorous on exam.  Baseline 2L O2 at night - prn Duonebs - Cont Trelegy - Continuous pulse ox  Prolonged QT interval EKG showing prolonged Qtc 598, Follow up 466  - Avoid QTc prolonging medications - Repeat EKG in the morning - Check BMP and Mag daily, K >4, Mag >2   Hypertension Hypertensive overnight to the 150s systolic. Home medications include amlodipine 5 mg. - Restart amlodipine 5 mg   FEN/GI: Regular diet PPx: Lovenox Dispo:Home pending clinical improvement . Barriers include monitoring respiratory status.   Subjective:  No acute events overnight.  Patient resting  comfortably.  She reports her breathing is doing well and she is maintaining her O2 sats on her home 2 L of oxygen.  Objective: Temp:  [98.2 F (36.8 C)-101.2 F (38.4 C)] 98.3 F (36.8 C) (11/29 0810) Pulse Rate:  [56-95] 61 (11/29 0810) Resp:  [15-25] 18 (11/29 0810) BP: (91-156)/(46-77) 156/62 (11/29 0810) SpO2:  [92 %-99 %] 94 % (11/29 0810) Weight:  [59 kg] 59 kg (11/28 1558) Physical Exam: General: Chronically ill-appearing, no acute distress Cardiovascular: Regular rate, regular rhythm, no murmurs on exam Respiratory: No increased work of breathing.  Diffuse expiratory rhonchi.  No wheezing or crackles appreciated. Abdomen: Nondistended, nontender Extremities: No peripheral edema, SCDs in place  Laboratory: Most recent CBC Lab Results  Component Value Date   WBC 15.5 (H) 09/13/2022   HGB 10.0 (L) 09/13/2022   HCT 29.7 (L) 09/13/2022   MCV 92.8 09/13/2022   PLT 107 (L) 09/13/2022   Most recent BMP    Latest Ref Rng & Units 09/13/2022    6:22 AM  BMP  Glucose 70 - 99 mg/dL 678   BUN 8 - 23 mg/dL 29   Creatinine 9.38 - 1.00 mg/dL 1.01   Sodium 751 - 025 mmol/L 132   Potassium 3.5 - 5.1 mmol/L 3.9   Chloride 98 - 111 mmol/L 99   CO2 22 - 32 mmol/L 26   Calcium 8.9 - 10.3 mg/dL 8.6     Glendale Chard, DO 09/13/2022, 9:43 AM  PGY-1, [facility] FPTS Intern pager: [phone], text pages welcome Secure chat group [facility] Teaching Service

## 2022-09-13 NOTE — Evaluation (Signed)
Physical Therapy Evaluation Patient Details Name: Madeline Jimenez MRN: [id number] DOB: [birth year] Today's Date: 09/13/2022  History of Present Illness  87 yo female presents to [facility] on 11/28 with fall, ShOB. Workup for acute on chronic respiratory failure, covid. PMH includes  COPD, hypertension, hyperlipidemia.  Clinical Impression   Pt presents with generalized weakness, impaired balance, and impaired activity tolerance vs baseline. Pt to benefit from acute PT to address deficits. Pt ambulated 60 ft with RW and close guard for safety, has more difficulty transferring without assist. Pt states she has good family support at home, recommend d/c home with HHPT post-acutely. SPO2 stable on 2LO2, >91% throughout. PT to progress mobility as tolerated, and will continue to follow acutely.         Recommendations for follow up therapy are one component of a multi-disciplinary discharge planning process, led by the attending physician.  Recommendations may be updated based on patient status, additional functional criteria and insurance authorization.  Follow Up Recommendations Home health PT      Assistance Recommended at Discharge Intermittent Supervision/Assistance  Patient can return home with the following  A little help with walking and/or transfers;A little help with bathing/dressing/bathroom    Equipment Recommendations None recommended by PT  Recommendations for Other Services       Functional Status Assessment Patient has had a recent decline in their functional status and demonstrates the ability to make significant improvements in function in a reasonable and predictable amount of time.     Precautions / Restrictions Precautions Precautions: Fall Restrictions Weight Bearing Restrictions: No      Mobility  Bed Mobility Overal bed mobility: Needs Assistance Bed Mobility: Supine to Sit, Sit to Supine     Supine to sit: Supervision Sit to supine: Supervision   General  bed mobility comments: for safety, lines/leads management    Transfers Overall transfer level: Needs assistance Equipment used: Rolling walker (2 wheels) Transfers: Sit to/from Stand Sit to Stand: Min assist           General transfer comment: light rise assist    Ambulation/Gait Ambulation/Gait assistance: Min guard Gait Distance (Feet): 60 Feet Assistive device: Rolling walker (2 wheels) Gait Pattern/deviations: Step-through pattern, Decreased stride length, Trunk flexed Gait velocity: decr     General Gait Details: cues for room navigation, upright posture  Stairs            Wheelchair Mobility    Modified Rankin (Stroke Patients Only)       Balance Overall balance assessment: Needs assistance Sitting-balance support: No upper extremity supported, Feet supported Sitting balance-Leahy Scale: Fair     Standing balance support: Bilateral upper extremity supported, During functional activity, Reliant on assistive device for balance Standing balance-Leahy Scale: Poor Standing balance comment: reliant on external assist                             Pertinent Vitals/Pain Pain Assessment Pain Assessment: No/denies pain    Home Living Family/patient expects to be discharged to:: Private residence Living Arrangements: Children;Other relatives (Niece) Available Help at Discharge: Available 24 hours/day;Family Type of Home: House Home Access: Stairs to enter Entrance Stairs-Rails: None Entrance Stairs-Number of Steps: 2   Home Layout: One level Home Equipment: Agricultural consultant (2 wheels);Grab bars - tub/shower      Prior Function Prior Level of Function : Needs assist             Mobility Comments: walks with RW,  on 2L O2 chronically ADLs Comments: family provides meals, Independent in self care     Hand Dominance        Extremity/Trunk Assessment   Upper Extremity Assessment Upper Extremity Assessment: Defer to OT evaluation     Lower Extremity Assessment Lower Extremity Assessment: Generalized weakness (attempted to MMT test, HOH makes instructions difficult)    Cervical / Trunk Assessment Cervical / Trunk Assessment: Kyphotic  Communication   Communication: HOH  Cognition Arousal/Alertness: Awake/alert Behavior During Therapy: WFL for tasks assessed/performed, Impulsive Overall Cognitive Status: Within Functional Limits for tasks assessed                                 General Comments: Patient very HOH        General Comments General comments (skin integrity, edema, etc.): vss on 2LO2- SpO2 post-gait is 91%    Exercises     Assessment/Plan    PT Assessment Patient needs continued PT services  PT Problem List Decreased strength;Decreased mobility;Decreased range of motion;Decreased activity tolerance;Decreased balance;Decreased knowledge of use of DME;Pain       PT Treatment Interventions DME instruction;Therapeutic activities;Gait training;Therapeutic exercise;Patient/family education;Balance training;Stair training;Functional mobility training    PT Goals (Current goals can be found in the Care Plan section)  Acute Rehab PT Goals Patient Stated Goal: home PT Goal Formulation: With patient Time For Goal Achievement: 09/27/22 Potential to Achieve Goals: Good    Frequency Min 3X/week     Co-evaluation               AM-PAC PT "6 Clicks" Mobility  Outcome Measure Help needed turning from your back to your side while in a flat bed without using bedrails?: A Little Help needed moving from lying on your back to sitting on the side of a flat bed without using bedrails?: A Little Help needed moving to and from a bed to a chair (including a wheelchair)?: A Little Help needed standing up from a chair using your arms (e.g., wheelchair or bedside chair)?: A Little Help needed to walk in hospital room?: A Little Help needed climbing 3-5 steps with a railing? : A Little 6  Click Score: 18    End of Session Equipment Utilized During Treatment: Oxygen Activity Tolerance: Patient tolerated treatment well Patient left: with call bell/phone within reach;in bed;with bed alarm set (sitting EOB per pt preference, watching tv) Nurse Communication: Mobility status PT Visit Diagnosis: Other abnormalities of gait and mobility (R26.89);Muscle weakness (generalized) (M62.81)    Time: 0347-4259 PT Time Calculation (min) (ACUTE ONLY): 12 min   Charges:   PT Evaluation $PT Eval Low Complexity: 1 Low         Prakriti Carignan S, PT DPT Acute Rehabilitation Services Pager [phone]  Office [phone]   Truddie Coco 09/13/2022, 5:31 PM

## 2022-09-13 NOTE — TOC Initial Note (Signed)
Transition of Care [facility]) - Initial/Assessment Note    Patient Details  Name: Madeline Jimenez MRN: [id number] Date of Birth: January 19, 1935  Transition of Care Madeline Jimenez) CM/SW Contact:    Madeline Bridgeman, RN Phone Number: 09/13/2022, 2:11 PM  Clinical Narrative:                 Patient is currently under respiratory isolation for COVID.  I called and spoke with the patient's niece via phone for Madeline Jimenez assessment and needs for home.  The patient lives with the niece at her home and was evaluated by therapy and in need of home Jimenez.  PT eval is still pending at this time.  DME at the home include RW and oxygen (2L/min [place] at home through [employer]).  The patient's niece states that the patient does not use the RW and ambulates without need for DME.  Transportation is provided by the niece for appointments.  PCP - Dr. Selmer Jimenez.  Home Jimenez - The patient's niece was given Medicare choice regarding home Jimenez services and the niece did not have a preference.  PT eval is pending at this time.  I called Madeline Jimenez, RNCM with [facility] and she will follow up for possible approval for services - I will place home Jimenez orders once patient is seen by PT.  CM will continue to follow the patient for [facility] needs for home.  Patient will need to d/c home on home O2 - Madeline Jimenez oxygen at the home and niece will need to bring portable tank.  Expected Discharge Plan: Home w Home [facility]enez Services Barriers to Discharge: Continued Madeline Work up   Patient Goals and CMS Choice Patient states their goals for this hospitalization and ongoing recovery are:: to get better and return home CMS Medicare.gov Compare Post Acute Care list provided to:: Patient Represenative (must comment) (Patient's neice, Madeline Jimenez) Choice offered to / list presented to :  Madeline Jimenez)  Expected Discharge Plan and Services Expected Discharge Plan: Home w Home Jimenez Services   Discharge Planning Services: CM Consult Post Acute Care Choice: Home  Jimenez Living arrangements for the past 2 months: Single Family Home                   DME Agency: Madeline Jimenez (Patient active with Madeline Jimenez DME company for home oxygen at 2L/min [place] chronic oxygen needs)       HH Arranged: PT, OT HH Agency: Madeline Jimenez (Madeline Jimenez) Date HH Agency Contacted: 09/13/22 Time HH Agency Contacted: [id number] Representative spoke with at Madeline Jimenez Agency: Madeline Jimenez, RNCM with Madeline Jimenez  Prior Living Arrangements/Services Living arrangements for the past 2 months: Single Family Home Lives with:: Relatives Patient language and need for interpreter reviewed:: Yes Do you feel safe going back to the place where you live?: Yes      Need for Family Participation in Patient Care: Yes (Comment) Care giver support system in place?: Yes (comment) Current home services: DME (RW at home) Criminal Activity/Legal Involvement Pertinent to Current Situation/Hospitalization: No - Comment as needed  Activities of Daily Living Home Assistive Devices/Equipment: None ADL Screening (condition at time of admission) Patient's cognitive ability adequate to safely complete daily activities?: Yes Is the patient deaf or have difficulty hearing?: Yes Does the patient have difficulty seeing, even when wearing glasses/contacts?: Yes Does the patient have difficulty concentrating, remembering, or making decisions?: No Patient able to express need for assistance with ADLs?: Yes Does the patient have difficulty dressing or bathing?: No Independently performs ADLs?: Yes (appropriate  for developmental age) Does the patient have difficulty walking or climbing stairs?: Yes Weakness of Legs: Both Weakness of Arms/Hands: None  Permission Sought/Granted Permission sought to share information with : Case Manager, Family Supports, Oceanographer granted to share information with : Yes, Verbal Permission Granted     Permission granted to share info w AGENCY: Home Jimenez  company, Madeline Jimenez  Permission granted to share info w Relationship: neice, Madeline Jimenez     Emotional Assessment Appearance:: Appears stated age Attitude/Demeanor/Rapport: Gracious Affect (typically observed): Accepting   Alcohol / Substance Use: Not Applicable Psych Involvement: No (comment)  Admission diagnosis:  COPD exacerbation (HCC) [J44.1] AKI (acute kidney injury) (HCC) [N17.9] Acute kidney injury superimposed on chronic kidney disease (HCC) [N17.9, N18.9] COVID [U07.1] COVID-19 [U07.1] Acute respiratory failure (HCC) [J96.00] Patient Active Problem List   Diagnosis Date Noted   COVID-19 09/12/2022   AKI (acute kidney injury) (HCC) 09/12/2022   Acute respiratory failure (HCC) 09/12/2022   Acute on chronic respiratory failure (HCC)    UTI (urinary tract infection) 03/07/2022   COPD exacerbation (HCC)    Stage 3a chronic kidney disease (CKD) (HCC)    Prolonged QT interval    Dependence on nocturnal oxygen therapy    Anemia 08/09/2014   Hypertension 05/14/2014   PCP:  Madeline Lacks, NP (Inactive) Pharmacy:   [facility] - [place], [place] - [address] [place] [address] Phone: [phone] Fax: [phone]     Social Determinants of Jimenez (SDOH) Interventions    Readmission Risk Interventions    09/13/2022    2:11 PM  Readmission Risk Prevention Plan  Transportation Screening Complete  PCP or Specialist Appt within 5-7 Days Complete  Home Care Screening Complete  Medication Review (RN CM) Complete

## 2022-09-13 NOTE — Evaluation (Signed)
Occupational Therapy Evaluation Patient Details Name: Madeline Jimenez MRN: [id number] DOB: 1935-07-18 Today's Date: 09/13/2022   History of Present Illness 87 yo female presents to [facility] on 11/28 with fall, ShOB. Workup for acute on chronic respiratory failure, covid. PMH includes  COPD, hypertension, hyperlipidemia.   Clinical Impression   Prior to this admission, patient living with her niece, walking with a RW, able to complete ADLs independently, and no longer driving. Patient reports her family assists with meals, and to assist with stairs getting into the house. Currently, patient is minimally impulsive with movement (bathroom urgency) and minimally tremulous in BUEs with functional tasks. No RW in room therefore one person HHA utilized for functional ambulation. Patient min A for transfers and ADLs and would benefit from [facility] at discharge. OT will continue to follow acutely.      Recommendations for follow up therapy are one component of a multi-disciplinary discharge planning process, led by the attending physician.  Recommendations may be updated based on patient status, additional functional criteria and insurance authorization.   Follow Up Recommendations  Home health OT     Assistance Recommended at Discharge Intermittent Supervision/Assistance  Patient can return home with the following A little help with walking and/or transfers;A little help with bathing/dressing/bathroom;Assistance with cooking/housework;Assist for transportation;Help with stairs or ramp for entrance    Functional Status Assessment  Patient has had a recent decline in their functional status and demonstrates the ability to make significant improvements in function in a reasonable and predictable amount of time.  Equipment Recommendations  Other (comment) (Will continue to assess pending progress)    Recommendations for Other Services       Precautions / Restrictions Precautions Precautions:  Fall Restrictions Weight Bearing Restrictions: No      Mobility Bed Mobility Overal bed mobility: Needs Assistance Bed Mobility: Supine to Sit, Sit to Supine     Supine to sit: Supervision Sit to supine: Supervision   General bed mobility comments: supervision due to impulsivity with bathroom urgency    Transfers Overall transfer level: Needs assistance Equipment used: Rolling walker (2 wheels) Transfers: Sit to/from Stand Sit to Stand: Min assist           General transfer comment: min A to come into standing as there was no RW in room, minimally tremulous and impulsive with movement due to bathroom urgency      Balance Overall balance assessment: Needs assistance Sitting-balance support: No upper extremity supported, Feet supported Sitting balance-Leahy Scale: Fair     Standing balance support: Bilateral upper extremity supported, During functional activity, Reliant on assistive device for balance Standing balance-Leahy Scale: Poor Standing balance comment: reliant on external assist                           ADL either performed or assessed with clinical judgement   ADL Overall ADL's : Needs assistance/impaired Eating/Feeding: Set up;Sitting   Grooming: Wash/dry face;Wash/dry hands;Min guard;Standing Grooming Details (indicate cue type and reason): min gaurd due to no RW being present Upper Body Bathing: Min guard;Standing   Lower Body Bathing: Moderate assistance;Sitting/lateral leans;Sit to/from stand   Upper Body Dressing : Min guard;Sitting   Lower Body Dressing: Moderate assistance;Sit to/from stand Lower Body Dressing Details (indicate cue type and reason): to maintain balance when pulling up underwear Toilet Transfer: Minimal assistance;Ambulation Toilet Transfer Details (indicate cue type and reason): no RW in room, relying on external assit from OT Toileting- Clothing Manipulation and Hygiene:  Min guard;Sitting/lateral lean;Sit to/from  stand       Functional mobility during ADLs: Minimal assistance;Cueing for sequencing;Cueing for safety General ADL Comments: Patient presenting with decreased activity tolerance and need for increased assist with ADLs     Vision Baseline Vision/History: 1 Wears glasses Ability to See in Adequate Light: 0 Adequate Patient Visual Report: No change from baseline       Perception     Praxis      Pertinent Vitals/Pain Pain Assessment Pain Assessment: No/denies pain     Hand Dominance     Extremity/Trunk Assessment Upper Extremity Assessment Upper Extremity Assessment: Generalized weakness (minimally tremulous with movement)   Lower Extremity Assessment Lower Extremity Assessment: Defer to PT evaluation   Cervical / Trunk Assessment Cervical / Trunk Assessment: Other exceptions (minimally kyphotic)   Communication Communication Communication: HOH   Cognition Arousal/Alertness: Awake/alert Behavior During Therapy: WFL for tasks assessed/performed, Impulsive Overall Cognitive Status: Within Functional Limits for tasks assessed                                 General Comments: Patient very HOH, but able to answer questions correctly, impulsive in standing due to bathroom urgency     General Comments  VSS on 2L O2    Exercises     Shoulder Instructions      Home Living Family/patient expects to be discharged to:: Private residence Living Arrangements: Children;Other relatives (Niece) Available Help at Discharge: Available 24 hours/day;Family Type of Home: House Home Access: Stairs to enter [employer] of Steps: 2 Entrance Stairs-Rails: None Home Layout: One level     Bathroom Shower/Tub: Tub/shower unit         Home Equipment: Agricultural consultant (2 wheels);Grab bars - tub/shower          Prior Functioning/Environment Prior Level of Function : Needs assist             Mobility Comments: walks with RW, on 2L O2  chronically ADLs Comments: family provides meals, Independent in self care        OT Problem List: Decreased strength;Decreased activity tolerance;Impaired balance (sitting and/or standing);Decreased coordination;Decreased safety awareness      OT Treatment/Interventions: Self-care/ADL training;Therapeutic exercise;Energy conservation;DME and/or AE instruction;Manual therapy;Patient/family education;Balance training    OT Goals(Current goals can be found in the care plan section) Acute Rehab OT Goals Patient Stated Goal: to get better OT Goal Formulation: With patient Time For Goal Achievement: 09/27/22 Potential to Achieve Goals: Good  OT Frequency: Min 2X/week    Co-evaluation              AM-PAC OT "6 Clicks" Daily Activity     Outcome Measure Help from another person eating meals?: A Little Help from another person taking care of personal grooming?: A Little Help from another person toileting, which includes using toliet, bedpan, or urinal?: A Little Help from another person bathing (including washing, rinsing, drying)?: A Little Help from another person to put on and taking off regular upper body clothing?: A Little Help from another person to put on and taking off regular lower body clothing?: A Lot 6 Click Score: 17   End of Session Nurse Communication: Mobility status  Activity Tolerance: Patient tolerated treatment well Patient left: in bed;with call bell/phone within reach;with bed alarm set  OT Visit Diagnosis: Unsteadiness on feet (R26.81);History of falling (Z91.81);Muscle weakness (generalized) (M62.81)  Time: 9622-2979 OT Time Calculation (min): 18 min Charges:  OT General Charges $OT Visit: 1 Visit OT Evaluation $OT Eval Moderate Complexity: 1 Mod  Pollyann Glen E. Khadijah Mastrianni, OTR/L Acute Rehabilitation Services [phone]   Cherlyn Cushing 09/13/2022, 10:02 AM

## 2022-09-14 ENCOUNTER — Other Ambulatory Visit (HOSPITAL_COMMUNITY): Payer: Self-pay

## 2022-09-14 LAB — BASIC METABOLIC PANEL
Anion gap: 10 (ref 5–15)
BUN: 24 mg/dL — ABNORMAL HIGH (ref 8–23)
CO2: 27 mmol/L (ref 22–32)
Calcium: 8.8 mg/dL — ABNORMAL LOW (ref 8.9–10.3)
Chloride: 101 mmol/L (ref 98–111)
Creatinine, Ser: 0.93 mg/dL (ref 0.44–1.00)
GFR, Estimated: >60 mL/min (ref >60)
Glucose, Bld: 190 mg/dL — ABNORMAL HIGH (ref 70–99)
Potassium: 4.1 mmol/L (ref 3.5–5.1)
Sodium: 138 mmol/L (ref 135–145)

## 2022-09-14 LAB — CBC
HCT: 33.8 % — ABNORMAL LOW (ref 36.0–46.0)
Hemoglobin: 11.1 g/dL — ABNORMAL LOW (ref 12.0–15.0)
MCH: 30.4 pg (ref 26.0–34.0)
MCHC: 32.8 g/dL (ref 30.0–36.0)
MCV: 92.6 fL (ref 80.0–100.0)
Platelets: 152 10*3/uL (ref 150–400)
RBC: 3.65 MIL/uL — ABNORMAL LOW (ref 3.87–5.11)
RDW: 13.8 % (ref 11.5–15.5)
WBC: 24.2 10*3/uL — ABNORMAL HIGH (ref 4.0–10.5)
nRBC: 0 % (ref 0.0–0.2)

## 2022-09-14 MED ORDER — DEXAMETHASONE 6 MG PO TABS
6.0000 mg | ORAL_TABLET | Freq: Every day | ORAL | 0 refills | Status: DC
  Filled 2022-09-14: qty 8, 8d supply, fill #0

## 2022-09-14 MED ORDER — MOLNUPIRAVIR EUA 200MG CAPSULE
4.0000 | ORAL_CAPSULE | Freq: Two times a day (BID) | ORAL | 0 refills | Status: AC
  Filled 2022-09-14: qty 16, 2d supply, fill #0

## 2022-09-14 NOTE — Progress Notes (Signed)
Daily Progress Note Intern Pager: [phone]  Patient name: Madeline Jimenez Medical record number: [id number] Date of birth: July 19, 1935 Age: 87 y.o. Gender: female  Primary Care Provider: Evie Lacks, NP (Inactive) Consultants: None Code Status: Full code  Pt Overview and Major Events to Date:  11/28: Admitted  Assessment and Plan:  Madeline Jimenez is a 87 y.o. female presenting with fall and respiratory distress likely in the setting of COVID infection. . Pertinent PMH/PSH includes COPD, hypertension, hyperlipidemia.    * Acute on chronic respiratory failure (HCC) Work of breathing normal.  Exam unchanged from prior: diffusely rhonchorous, no wheezing. O2 maintained on home 2L oxygen.  Patient will be stable for late afternoon discharge. - Decadron 6 mg for 10 days - Molnupiravir 5-day course - Blood culture: no growth  - On 2L [place], maintain sats 88-92% - COVID precautions - Consider repeat CXR if symptoms worsen  COVID-19 Fever and cough beginning 11/24. Meets criteria for covid treatment with molnupiravir. -Tylenol prn for fever - Start molnupiravir (11/28 - 12/2)  AKI (acute kidney injury) (HCC) Creatinine 1.41>1.21.  Baseline appears to be ~1.0, but carries diagnosis of CKD3.   - mIVF NS 75 mL/h - Regular diet, PO as tolerated - Daily BMP pending  COPD exacerbation (HCC) Breathing stable.  Continues to be diffusely rhonchorous on exam.  Baseline 2L O2 at night - prn Duonebs - Cont Trelegy - Continuous pulse ox  Prolonged QT interval EKG initially with prolonged Qtc 598, Follow up 466  - Avoid QTc prolonging medications - Repeat EKG to reassess restarting Lexapro and trazodone - Check BMP and Mag daily, K >4, Mag >2   Hypertension Hypertensive overnight to the 150s systolic. Home medications include amlodipine 5 mg. - Restart amlodipine 5 mg   FEN/GI: Regular diet PPx: Lovenox Dispo:Home today. Barriers include monitoring respiratory status.    Subjective:  No acute events overnight.  Patient resting comfortably.  She was not on her oxygen when I came into the room this morning with her O2 sats down into the high 70s.  Reapplied O2 and monitored O2 sat which recovered to above 88%.  Notified nurse tech on the floor to assist with personal care.  Objective: Temp:  [97.8 F (36.6 C)-98.9 F (37.2 C)] 97.8 F (36.6 C) (11/30 0803) Pulse Rate:  [57-74] 57 (11/30 0803) Resp:  [15-19] 15 (11/30 0803) BP: (143-155)/(55-82) 155/70 (11/30 0803) SpO2:  [97 %-98 %] 98 % (11/30 0803) Physical Exam: General: Chronically ill-appearing, no acute distress Cardiovascular: Regular rate, regular rhythm, no murmurs on exam Respiratory: Work of breathing.  Diffuse rhonchi on exam with no wheezing or crackles. Abdomen: Nondistended, nontender Extremities: No peripheral edema, SCDs in place.  Laboratory: Most recent CBC Lab Results  Component Value Date   WBC 15.5 (H) 09/13/2022   HGB 10.0 (L) 09/13/2022   HCT 29.7 (L) 09/13/2022   MCV 92.8 09/13/2022   PLT 107 (L) 09/13/2022   Most recent BMP    Latest Ref Rng & Units 09/13/2022    6:22 AM  BMP  Glucose 70 - 99 mg/dL 938   BUN 8 - 23 mg/dL 29   Creatinine 1.01 - 1.00 mg/dL 7.51   Sodium 025 - 852 mmol/L 132   Potassium 3.5 - 5.1 mmol/L 3.9   Chloride 98 - 111 mmol/L 99   CO2 22 - 32 mmol/L 26   Calcium 8.9 - 10.3 mg/dL 8.6    Glendale Chard, DO 09/14/2022, 8:40 AM  PGY-1, [facility] Intern pager: [phone], text pages welcome Secure chat group [place]

## 2022-09-14 NOTE — Discharge Instructions (Signed)
Dear Madeline Jimenez,  Thank you for letting us participate in your care. You were hospitalized for  Acute on chronic respiratory failure (HCC) and a COVID infection. You were treated with steroids and molnupiravir for your COVID infection.   POST-HOSPITAL & CARE INSTRUCTIONS You will continue your Decadron until 12/8. He will continue your molnupiravir until 12/2.  Go to your follow up appointments (listed below)   DOCTOR'S APPOINTMENT   No future appointments.   Take care and be well!  Family Medicine Teaching Service Inpatient Team [place]  [facility]  [address] [place], [place] [address] [phone]

## 2022-09-14 NOTE — Progress Notes (Signed)
Mobility Specialist: Progress Note   09/14/22 1134  Mobility  Activity Ambulated with assistance in hallway  Level of Assistance Standby assist, set-up cues, supervision of patient - no hands on  Assistive Device Front wheel walker  Distance Ambulated (ft) 120 ft  Activity Response Tolerated well  Mobility Referral Yes  $Mobility charge 1 Mobility   Post-Mobility: 97% SpO2  Pt received sitting EOB and agreeable to mobility. Ambulated on 2 L/min [place]. No c/o throughout. Pt back to bed after session with call bell at her side. Bed alarm is on.   Robin Pafford Mobility Specialist Please contact via SecureChat or Rehab office at [phone]

## 2022-09-14 NOTE — Discharge Summary (Addendum)
Family Medicine Teaching [facility] Discharge Summary  Patient name: Madeline Jimenez Medical record number: [id number] Date of birth: January 20, 1935 Age: 87 y.o. Gender: female Date of Admission: 09/12/2022  Date of Discharge: 09/14/22  Admitting Physician: Doreene Eland, MD  Primary Care Provider: Evie Lacks, NP (Inactive) Consultants: None   Indication for Hospitalization: Acute on Chronic Respiratory Failure   Discharge Diagnoses/Problem List:  Principal Problem for Admission: Acute on Chronic Respiratory Failure  Other Problems addressed during stay:  Principal Problem:   Acute on chronic respiratory failure (HCC) Active Problems:   COVID-19   COPD exacerbation (HCC)   AKI (acute kidney injury) (HCC)   Prolonged QT interval   Hypertension   Acute respiratory failure [facility])    Brief Hospital Course:  Madeline Jimenez is a 87 y.o. female presenting for SOB and URI symptoms for several days. Her past medical history is significant for COPD, HTN, HLD. Her hospital course is outlined below:    Acute on chronic respiratory failure  COVID infection  COPD: Patient admitted due to increased work of breathing from her baseline oxygen of 2 L at home.  She reports having upper respiratory symptoms for the past 3 days.  COVID test positive.  CXR did not show pneumonia.  She was started on molnupiravir and Decadron to treat COVID infection and support her COPD.  She was maintained on 2 L with goal O2 saturation 88-92%.  At discharge she was continued on molnupiravir ending 12/2 and Decadron ending 12/8.   AKI: Creatinine admission 1.41.  Baseline appears to be around 1.0.  She was treated with IV fluids and at discharge her creatinine was 0.93.  Prolonged QTc interval: EKG on admission showed QTc at 598.  Repeat EKG showed 466.    PCP follow-up: Follow up respiratory status s/p COVID infection.  Follow up on COPD maintenance therapy.  Disposition: Stable    Discharge Condition: Home  Discharge Exam:  Vitals:   09/14/22 0353 09/14/22 0803  BP: (!) 143/72 (!) 155/70  Pulse: 74 (!) 57  Resp: 18 15  Temp: 98.8 F (37.1 C) 97.8 F (36.6 C)  SpO2: 98% 98%   Chronically ill-appearing, no acute distress Cardio: Regular rate, regular rhythm, no murmurs on exam. Pulm: No increased work of breathing, diffuse expiratory rhonchi on exam Abdominal: bowel sounds present, soft, non-tender, non-distended Extremities: no peripheral edema  Neuro: alert and oriented x3, speech normal in content, no facial asymmetry, strength intact and equal bilaterally in UE and LE  Significant Procedures: none   Significant Labs and Imaging:  Recent Labs  Lab 09/12/22 1434 09/13/22 0622 09/14/22 1018  WBC  --  15.5* 24.2*  HGB 10.5* 10.0* 11.1*  HCT 31.0* 29.7* 33.8*  PLT  --  107* 152   Recent Labs  Lab 09/12/22 1420 09/12/22 1434 09/12/22 1434 09/13/22 0622 09/14/22 1018  NA  --  136  --  132* 138  K  --  3.9   < > 3.9 4.1  CL  --   --   --  99 101  CO2  --   --   --  26 27  GLUCOSE  --   --   --  148* 190*  BUN  --   --   --  29* 24*  CREATININE  --   --   --  1.21* 0.93  CALCIUM  --   --   --  8.6* 8.8*  MG 1.8  --   --  2.1  --    < > = values in this interval not displayed.    CXR:  1. Small bilateral pleural effusions. 2. Mild cardiomegaly  Results/Tests Pending at Time of Discharge: None  Discharge Medications:  Allergies as of 09/14/2022   No Known Allergies      Medication List     STOP taking these medications    sodium chloride 1 g tablet       TAKE these medications    amLODipine 5 MG tablet Commonly known as: NORVASC Take 5 mg by mouth daily.   dexamethasone 6 MG tablet Commonly known as: DECADRON Take 1 tablet (6 mg total) by mouth daily for 8 days.   escitalopram 10 MG tablet Commonly known as: LEXAPRO Take 10 mg by mouth daily.   molnupiravir EUA 200 mg Caps capsule Commonly known as:  LAGEVRIO Take 4 capsules (800 mg total) by mouth 2 (two) times daily for 2 days.   multivitamin with minerals tablet Take 1 tablet by mouth daily with breakfast.   thiamine 100 MG tablet Commonly known as: VITAMIN B1 Take 1 tablet (100 mg total) by mouth daily.   traZODone 50 MG tablet Commonly known as: DESYREL Take 50 mg by mouth at bedtime.   Trelegy Ellipta 100-62.5-25 MCG/ACT Aepb Generic drug: Fluticasone-Umeclidin-Vilant Inhale 1 puff into the lungs See admin instructions. Inhale 1 puff into the lungs at 8 AM and 5 PM        Discharge Instructions: Please refer to Patient Instructions section of EMR for full details.  Patient was counseled important signs and symptoms that should prompt return to medical care, changes in medications, dietary instructions, activity restrictions, and follow up appointments.   Follow-Up Appointments:   Glendale Chard, DO 09/14/2022, 12:21 PM PGY-1, [facility] Family Medicine   I was personally present and re-performed the exam. I verified the service and findings are accurately documented in the intern's note. Jerre Simon, MD 09/14/2022 12:27 PM

## 2022-09-17 LAB — CULTURE, BLOOD (ROUTINE X 2)
Culture: NO GROWTH
Culture: NO GROWTH
Special Requests: ADEQUATE
Special Requests: ADEQUATE

## 2022-09-20 ENCOUNTER — Emergency Department (HOSPITAL_COMMUNITY): Payer: Medicare Other

## 2022-09-20 ENCOUNTER — Other Ambulatory Visit: Payer: Self-pay

## 2022-09-20 ENCOUNTER — Inpatient Hospital Stay (HOSPITAL_COMMUNITY)
Admission: EM | Admit: 2022-09-20 | Discharge: 2022-10-03 | DRG: 579 | Disposition: A | Discharge status: Skilled Nursing Facility | Payer: Medicare Other | Attending: Family Medicine | Admitting: Family Medicine

## 2022-09-20 DIAGNOSIS — I2699 Other pulmonary embolism without acute cor pulmonale: Secondary | ICD-10-CM | POA: Diagnosis present

## 2022-09-20 DIAGNOSIS — Z8249 Family history of ischemic heart disease and other diseases of the circulatory system: Secondary | ICD-10-CM

## 2022-09-20 DIAGNOSIS — Z6821 Body mass index (BMI) 21.0-21.9, adult: Secondary | ICD-10-CM

## 2022-09-20 DIAGNOSIS — Z87440 Personal history of urinary (tract) infections: Secondary | ICD-10-CM

## 2022-09-20 DIAGNOSIS — I808 Phlebitis and thrombophlebitis of other sites: Secondary | ICD-10-CM | POA: Diagnosis present

## 2022-09-20 DIAGNOSIS — H9190 Unspecified hearing loss, unspecified ear: Secondary | ICD-10-CM | POA: Diagnosis present

## 2022-09-20 DIAGNOSIS — L03114 Cellulitis of left upper limb: Principal | ICD-10-CM | POA: Diagnosis present

## 2022-09-20 DIAGNOSIS — E785 Hyperlipidemia, unspecified: Secondary | ICD-10-CM | POA: Diagnosis present

## 2022-09-20 DIAGNOSIS — T368X5A Adverse effect of other systemic antibiotics, initial encounter: Secondary | ICD-10-CM | POA: Diagnosis present

## 2022-09-20 DIAGNOSIS — W19XXXA Unspecified fall, initial encounter: Secondary | ICD-10-CM | POA: Diagnosis present

## 2022-09-20 DIAGNOSIS — Z66 Do not resuscitate: Secondary | ICD-10-CM | POA: Diagnosis present

## 2022-09-20 DIAGNOSIS — D72828 Other elevated white blood cell count: Secondary | ICD-10-CM

## 2022-09-20 DIAGNOSIS — U071 COVID-19: Secondary | ICD-10-CM | POA: Diagnosis present

## 2022-09-20 DIAGNOSIS — E871 Hypo-osmolality and hyponatremia: Secondary | ICD-10-CM | POA: Diagnosis not present

## 2022-09-20 DIAGNOSIS — E870 Hyperosmolality and hypernatremia: Secondary | ICD-10-CM | POA: Diagnosis not present

## 2022-09-20 DIAGNOSIS — I330 Acute and subacute infective endocarditis: Secondary | ICD-10-CM | POA: Diagnosis present

## 2022-09-20 DIAGNOSIS — D638 Anemia in other chronic diseases classified elsewhere: Secondary | ICD-10-CM | POA: Diagnosis present

## 2022-09-20 DIAGNOSIS — Z789 Other specified health status: Secondary | ICD-10-CM

## 2022-09-20 DIAGNOSIS — I4719 Other supraventricular tachycardia: Secondary | ICD-10-CM | POA: Diagnosis not present

## 2022-09-20 DIAGNOSIS — R7303 Prediabetes: Secondary | ICD-10-CM | POA: Diagnosis present

## 2022-09-20 DIAGNOSIS — I129 Hypertensive chronic kidney disease with stage 1 through stage 4 chronic kidney disease, or unspecified chronic kidney disease: Secondary | ICD-10-CM | POA: Diagnosis present

## 2022-09-20 DIAGNOSIS — N170 Acute kidney failure with tubular necrosis: Secondary | ICD-10-CM | POA: Diagnosis not present

## 2022-09-20 DIAGNOSIS — Z7951 Long term (current) use of inhaled steroids: Secondary | ICD-10-CM

## 2022-09-20 DIAGNOSIS — L02413 Cutaneous abscess of right upper limb: Secondary | ICD-10-CM | POA: Diagnosis present

## 2022-09-20 DIAGNOSIS — E860 Dehydration: Secondary | ICD-10-CM | POA: Diagnosis present

## 2022-09-20 DIAGNOSIS — R0902 Hypoxemia: Secondary | ICD-10-CM

## 2022-09-20 DIAGNOSIS — R9431 Abnormal electrocardiogram [ECG] [EKG]: Secondary | ICD-10-CM | POA: Diagnosis present

## 2022-09-20 DIAGNOSIS — Z87891 Personal history of nicotine dependence: Secondary | ICD-10-CM

## 2022-09-20 DIAGNOSIS — J441 Chronic obstructive pulmonary disease with (acute) exacerbation: Secondary | ICD-10-CM | POA: Diagnosis present

## 2022-09-20 DIAGNOSIS — N1831 Chronic kidney disease, stage 3a: Secondary | ICD-10-CM | POA: Diagnosis present

## 2022-09-20 DIAGNOSIS — M25511 Pain in right shoulder: Secondary | ICD-10-CM | POA: Diagnosis present

## 2022-09-20 DIAGNOSIS — R7881 Bacteremia: Secondary | ICD-10-CM | POA: Diagnosis present

## 2022-09-20 DIAGNOSIS — Z90710 Acquired absence of both cervix and uterus: Secondary | ICD-10-CM

## 2022-09-20 DIAGNOSIS — E43 Unspecified severe protein-calorie malnutrition: Secondary | ICD-10-CM | POA: Diagnosis present

## 2022-09-20 DIAGNOSIS — A419 Sepsis, unspecified organism: Secondary | ICD-10-CM

## 2022-09-20 DIAGNOSIS — I739 Peripheral vascular disease, unspecified: Secondary | ICD-10-CM | POA: Diagnosis present

## 2022-09-20 DIAGNOSIS — Z79899 Other long term (current) drug therapy: Secondary | ICD-10-CM

## 2022-09-20 DIAGNOSIS — J9621 Acute and chronic respiratory failure with hypoxia: Secondary | ICD-10-CM | POA: Diagnosis present

## 2022-09-20 DIAGNOSIS — L03113 Cellulitis of right upper limb: Secondary | ICD-10-CM | POA: Diagnosis present

## 2022-09-20 DIAGNOSIS — J440 Chronic obstructive pulmonary disease with acute lower respiratory infection: Secondary | ICD-10-CM | POA: Diagnosis present

## 2022-09-20 DIAGNOSIS — R54 Age-related physical debility: Secondary | ICD-10-CM | POA: Diagnosis present

## 2022-09-20 DIAGNOSIS — J15212 Pneumonia due to Methicillin resistant Staphylococcus aureus: Secondary | ICD-10-CM | POA: Diagnosis present

## 2022-09-20 DIAGNOSIS — L02414 Cutaneous abscess of left upper limb: Secondary | ICD-10-CM | POA: Diagnosis present

## 2022-09-20 DIAGNOSIS — J9622 Acute and chronic respiratory failure with hypercapnia: Secondary | ICD-10-CM | POA: Diagnosis present

## 2022-09-20 DIAGNOSIS — J9620 Acute and chronic respiratory failure, unspecified whether with hypoxia or hypercapnia: Secondary | ICD-10-CM | POA: Diagnosis present

## 2022-09-20 DIAGNOSIS — M00022 Staphylococcal arthritis, left elbow: Secondary | ICD-10-CM | POA: Diagnosis present

## 2022-09-20 DIAGNOSIS — Z9981 Dependence on supplemental oxygen: Secondary | ICD-10-CM

## 2022-09-20 DIAGNOSIS — B9562 Methicillin resistant Staphylococcus aureus infection as the cause of diseases classified elsewhere: Secondary | ICD-10-CM | POA: Diagnosis present

## 2022-09-20 DIAGNOSIS — E876 Hypokalemia: Secondary | ICD-10-CM | POA: Diagnosis not present

## 2022-09-20 DIAGNOSIS — R7989 Other specified abnormal findings of blood chemistry: Secondary | ICD-10-CM | POA: Diagnosis not present

## 2022-09-20 DIAGNOSIS — D72820 Lymphocytosis (symptomatic): Secondary | ICD-10-CM | POA: Diagnosis present

## 2022-09-20 DIAGNOSIS — J1282 Pneumonia due to coronavirus disease 2019: Secondary | ICD-10-CM | POA: Diagnosis present

## 2022-09-20 DIAGNOSIS — R000 Tachycardia, unspecified: Secondary | ICD-10-CM | POA: Insufficient documentation

## 2022-09-20 DIAGNOSIS — I10 Essential (primary) hypertension: Secondary | ICD-10-CM | POA: Diagnosis present

## 2022-09-20 DIAGNOSIS — N179 Acute kidney failure, unspecified: Secondary | ICD-10-CM | POA: Diagnosis present

## 2022-09-20 DIAGNOSIS — R1310 Dysphagia, unspecified: Secondary | ICD-10-CM | POA: Diagnosis present

## 2022-09-20 DIAGNOSIS — D72829 Elevated white blood cell count, unspecified: Secondary | ICD-10-CM | POA: Diagnosis present

## 2022-09-20 DIAGNOSIS — J189 Pneumonia, unspecified organism: Secondary | ICD-10-CM

## 2022-09-20 LAB — COMPREHENSIVE METABOLIC PANEL
ALT: 30 U/L (ref 0–44)
AST: 19 U/L (ref 15–41)
Albumin: 2.8 g/dL — ABNORMAL LOW (ref 3.5–5.0)
Alkaline Phosphatase: 97 U/L (ref 38–126)
Anion gap: 13 (ref 5–15)
BUN: 44 mg/dL — ABNORMAL HIGH (ref 8–23)
CO2: 31 mmol/L (ref 22–32)
Calcium: 9.5 mg/dL (ref 8.9–10.3)
Chloride: 92 mmol/L — ABNORMAL LOW (ref 98–111)
Creatinine, Ser: 1.62 mg/dL — ABNORMAL HIGH (ref 0.44–1.00)
GFR, Estimated: 31 mL/min — ABNORMAL LOW (ref >60)
Glucose, Bld: 275 mg/dL — ABNORMAL HIGH (ref 70–99)
Potassium: 3.8 mmol/L (ref 3.5–5.1)
Sodium: 136 mmol/L (ref 135–145)
Total Bilirubin: 0.5 mg/dL (ref 0.3–1.2)
Total Protein: 6.5 g/dL (ref 6.5–8.1)

## 2022-09-20 LAB — CBC WITH DIFFERENTIAL/PLATELET
Abs Immature Granulocytes: 0.7 10*3/uL — ABNORMAL HIGH (ref 0.00–0.07)
Basophils Absolute: 0 10*3/uL (ref 0.0–0.1)
Basophils Relative: 0 %
Eosinophils Absolute: 0 10*3/uL (ref 0.0–0.5)
Eosinophils Relative: 0 %
HCT: 36.2 % (ref 36.0–46.0)
Hemoglobin: 12.1 g/dL (ref 12.0–15.0)
Lymphocytes Relative: 32 %
Lymphs Abs: 21.4 10*3/uL — ABNORMAL HIGH (ref 0.7–4.0)
MCH: 30.6 pg (ref 26.0–34.0)
MCHC: 33.4 g/dL (ref 30.0–36.0)
MCV: 91.6 fL (ref 80.0–100.0)
Monocytes Absolute: 2 10*3/uL — ABNORMAL HIGH (ref 0.1–1.0)
Monocytes Relative: 3 %
Myelocytes: 1 %
Neutro Abs: 42.8 10*3/uL — ABNORMAL HIGH (ref 1.7–7.7)
Neutrophils Relative %: 64 %
Platelets: 319 10*3/uL (ref 150–400)
RBC: 3.95 MIL/uL (ref 3.87–5.11)
RDW: 14.3 % (ref 11.5–15.5)
WBC: 66.9 10*3/uL (ref 4.0–10.5)
nRBC: 0 % (ref 0.0–0.2)
nRBC: 0 /100 WBC

## 2022-09-20 LAB — LACTIC ACID, PLASMA: Lactic Acid, Venous: 2.8 mmol/L (ref 0.5–1.9)

## 2022-09-20 LAB — APTT: aPTT: 28 seconds (ref 24–36)

## 2022-09-20 LAB — PROTIME-INR
INR: 1.6 — ABNORMAL HIGH (ref 0.8–1.2)
Prothrombin Time: 18.6 seconds — ABNORMAL HIGH (ref 11.4–15.2)

## 2022-09-20 MED ORDER — SODIUM CHLORIDE 0.9 % IV SOLN
500.0000 mg | Freq: Once | INTRAVENOUS | Status: AC
  Administered 2022-09-20: 500 mg via INTRAVENOUS
  Filled 2022-09-20: qty 5

## 2022-09-20 MED ORDER — SODIUM CHLORIDE 0.9 % IV SOLN
1.0000 g | Freq: Once | INTRAVENOUS | Status: AC
  Administered 2022-09-20: 1 g via INTRAVENOUS
  Filled 2022-09-20: qty 10

## 2022-09-20 MED ORDER — LACTATED RINGERS IV BOLUS (SEPSIS)
1000.0000 mL | Freq: Once | INTRAVENOUS | Status: AC
  Administered 2022-09-20: 1000 mL via INTRAVENOUS

## 2022-09-20 NOTE — ED Provider Notes (Signed)
[facility] EMERGENCY DEPARTMENT Provider Note   CSN: [id number] Arrival date & time: 09/20/22  2137     History  Chief Complaint  Patient presents with   Weakness    Madeline Jimenez is a 87 y.o. female.  Patient is an 87 year old female with a past medical history of COPD on 2 L nasal cannula, hypertension and CKD presenting to the emergency department with shortness of breath.  Patient was recently discharged from the hospital about 1 week ago after being diagnosed with COVID.  She states that she started to have increasing shortness of breath which prompted her to come to the emergency department.  She denies any chest pain or fevers.  Patient also reported that she had a fall today though per EMS that she had a fell on Thanksgiving.  She states that she is having pain in her right shoulder and was stuck on the ground.  Patient denies hitting her head or losing consciousness.  The history is provided by the patient and the EMS personnel.  Weakness      Home Medications Prior to Admission medications   Medication Sig Start Date End Date Taking? Authorizing Provider  amLODipine (NORVASC) 5 MG tablet Take 5 mg by mouth daily. 03/06/22   [provider]  dexamethasone (DECADRON) 6 MG tablet Take 1 tablet (6 mg total) by mouth daily for 8 days. 09/14/22 09/22/22  Darci Current, DO  escitalopram (LEXAPRO) 10 MG tablet Take 10 mg by mouth daily. 01/26/22   [provider]  Multiple Vitamins-Minerals (MULTIVITAMIN WITH MINERALS) tablet Take 1 tablet by mouth daily with breakfast.    [provider]  thiamine 100 MG tablet Take 1 tablet (100 mg total) by mouth daily. Patient not taking: Reported on 09/12/2022 03/11/22   Oswald Hillock, MD  traZODone (DESYREL) 50 MG tablet Take 50 mg by mouth at bedtime. 12/14/21   [provider]  TRELEGY ELLIPTA 100-62.5-25 MCG/ACT AEPB Inhale 1 puff into the lungs See admin instructions. Inhale 1 puff  into the lungs at 8 AM and 5 PM 02/13/22   [provider]      Allergies    Patient has no known allergies.    Review of Systems   Review of Systems  Neurological:  Positive for weakness.    Physical Exam Updated Vital Signs BP 123/62   Pulse (!) 136   Temp 98.8 F (37.1 C) (Oral)   Resp (!) 21   Ht 5\' 4"  (1.626 m)   Wt 58 kg   SpO2 96%   BMI 21.95 kg/m  Physical Exam Vitals and nursing note reviewed.  Constitutional:      General: She is not in acute distress.    Appearance: Normal appearance. She is ill-appearing.  HENT:     Head: Normocephalic and atraumatic.     Nose: Nose normal.     Mouth/Throat:     Mouth: Mucous membranes are moist.     Pharynx: Oropharynx is clear.  Eyes:     Extraocular Movements: Extraocular movements intact.     Conjunctiva/sclera: Conjunctivae normal.  Cardiovascular:     Rate and Rhythm: Regular rhythm. Tachycardia present.     Pulses: Normal pulses.     Heart sounds: Normal heart sounds.  Pulmonary:     Effort: No respiratory distress.     Breath sounds: Rales (Diffuse) present.     Comments: Mild belly breathing, no retractions, speaking in full sentences Abdominal:     General:  Abdomen is flat.     Palpations: Abdomen is soft.     Tenderness: There is no abdominal tenderness.  Musculoskeletal:        General: Normal range of motion.     Cervical back: Normal range of motion and neck supple.     Right lower leg: No edema.     Left lower leg: No edema.  Skin:    General: Skin is warm and dry.  Neurological:     General: No focal deficit present.     Mental Status: She is alert and oriented to person, place, and time.  Psychiatric:        Mood and Affect: Mood normal.        Behavior: Behavior normal.     ED Results / Procedures / Treatments   Labs (all labs ordered are listed, but only abnormal results are displayed) Labs Reviewed  LACTIC ACID, PLASMA - Abnormal; Notable for the following components:       Result Value   Lactic Acid, Venous 2.8 (*)    All other components within normal limits  COMPREHENSIVE METABOLIC PANEL - Abnormal; Notable for the following components:   Chloride 92 (*)    Glucose, Bld 275 (*)    BUN 44 (*)    Creatinine, Ser 1.62 (*)    Albumin 2.8 (*)    GFR, Estimated 31 (*)    All other components within normal limits  CBC WITH DIFFERENTIAL/PLATELET - Abnormal; Notable for the following components:   WBC 66.9 (*)    Neutro Abs 42.8 (*)    Lymphs Abs 21.4 (*)    Monocytes Absolute 2.0 (*)    Abs Immature Granulocytes 0.70 (*)    All other components within normal limits  PROTIME-INR - Abnormal; Notable for the following components:   Prothrombin Time 18.6 (*)    INR 1.6 (*)    All other components within normal limits  CULTURE, BLOOD (ROUTINE X 2)  CULTURE, BLOOD (ROUTINE X 2)  URINE CULTURE  APTT  LACTIC ACID, PLASMA  URINALYSIS, ROUTINE W REFLEX MICROSCOPIC  CK  PATHOLOGIST SMEAR REVIEW    EKG EKG Interpretation  Date/Time:  Wednesday September 20 2022 22:21:51 EST Ventricular Rate:  138 PR Interval:  102 QRS Duration: 88 QT Interval:  322 QTC Calculation: 488 R Axis:   -3 Text Interpretation: Sinus tachycardia with irregular rate Borderline prolonged QT interval Premature atrial complexes HEART RATE INCREASED SINCE last EKG Confirmed by Elayne Snare ([id number]) on 09/20/2022 10:46:34 PM  Radiology DG Chest Port 1 View  Result Date: 09/20/2022 CLINICAL DATA:  Questionable sepsis EXAM: PORTABLE CHEST 1 VIEW COMPARISON:  09/12/2022 FINDINGS: Cardiomegaly. Patchy opacities in the upper lobes, right greater than left. No visible effusions. No acute bony abnormality. Old left rib fractures. IMPRESSION: Patchy opacities in the upper lobes, right greater than left. This could reflect pneumonia. Peripheral opacity in the left upper lobe is somewhat nodular. Recommend follow up to clearance. Electronically Signed   By: Charlett Nose M.D.   On: 09/20/2022  22:31    Procedures Procedures    Medications Ordered in ED Medications  cefTRIAXone (ROCEPHIN) 1 g in sodium chloride 0.9 % 100 mL IVPB (has no administration in time range)  azithromycin (ZITHROMAX) 500 mg in sodium chloride 0.9 % 250 mL IVPB (has no administration in time range)  lactated ringers bolus 1,000 mL (1,000 mLs Intravenous New Bag/Given 09/20/22 2218)    ED Course/ Medical Decision Making/ A&P  Medical Decision Making This patient presents to the ED with chief complaint(s) of shortness of breath with pertinent past medical history of COPD on 2 L [place], recent admission for COVID, HTN, CKD which further complicates the presenting complaint. The complaint involves an extensive differential diagnosis and also carries with it a high risk of complications and morbidity.    The differential diagnosis includes Pneumonia, pneumothorax, sepsis, dehydration, pulmonary edema, pleural effusion   Additional history obtained: Additional history obtained from N/A Records reviewed previous admission documents  ED Course and Reassessment: On patient's arrive she was awake and alert, ill appearing but in no acute distress. She had coarse bilaterally breath sounds concerning for possible pneumonia and CXR and sepsis work up was initiated. She was in sinus tachycardia and started on IVF. She had no wheezing on exam making COPD exacerbation less likely. She will be closely reassessed.  Independent labs interpretation:  The following labs were independently interpreted: New leukocytosis, mild AKI  Independent visualization of imaging: - I independently visualized the following imaging with scope of interpretation limited to determining acute life threatening conditions related to emergency care: CXR, which revealed  R > L side upper lobe pneumonia  Consultation: - Consulted or discussed management/test interpretation w/ external professional:  Hospitalist  Consideration for admission or further workup: patient requires admission for further management of her hypoxia and pneumonia in the setting of new leukocytosis Social Determinants of health: N/A    Amount and/or Complexity of Data Reviewed Labs: ordered. Radiology: ordered. ECG/medicine tests: ordered.          Final Clinical Impression(s) / ED Diagnoses Final diagnoses:  COVID-19  Pneumonia of both upper lobes due to infectious organism  Hypoxia  Other elevated white blood cell (WBC) count    Rx / DC Orders ED Discharge Orders     None         Kemper Durie, DO 09/20/22 2337

## 2022-09-20 NOTE — ED Triage Notes (Signed)
Pt BIB [facility] from home, pt had a fall on thanksgiving. Swollen shoulder/forearm and tenderness to the neck. Tested positive for covid 12/5. SOB started today.   134/76 130 normal sinus  2L baseline on 6L with neb  20G right forearm  CBG 356, not diabetic? Hx of COPD

## 2022-09-21 ENCOUNTER — Inpatient Hospital Stay (HOSPITAL_COMMUNITY): Payer: Medicare Other

## 2022-09-21 ENCOUNTER — Encounter (HOSPITAL_COMMUNITY)
Admission: EM | Disposition: A | Discharge status: Skilled Nursing Facility | Payer: Self-pay | Source: Home / Self Care | Attending: Family Medicine

## 2022-09-21 ENCOUNTER — Encounter (HOSPITAL_COMMUNITY): Payer: Self-pay | Admitting: Family Medicine

## 2022-09-21 ENCOUNTER — Inpatient Hospital Stay (HOSPITAL_COMMUNITY): Payer: Medicare Other | Admitting: Anesthesiology

## 2022-09-21 ENCOUNTER — Other Ambulatory Visit: Payer: Self-pay

## 2022-09-21 DIAGNOSIS — U071 COVID-19: Secondary | ICD-10-CM | POA: Diagnosis present

## 2022-09-21 DIAGNOSIS — E43 Unspecified severe protein-calorie malnutrition: Secondary | ICD-10-CM | POA: Diagnosis present

## 2022-09-21 DIAGNOSIS — N1831 Chronic kidney disease, stage 3a: Secondary | ICD-10-CM | POA: Diagnosis present

## 2022-09-21 DIAGNOSIS — I2699 Other pulmonary embolism without acute cor pulmonale: Secondary | ICD-10-CM | POA: Diagnosis present

## 2022-09-21 DIAGNOSIS — L02414 Cutaneous abscess of left upper limb: Secondary | ICD-10-CM

## 2022-09-21 DIAGNOSIS — A4102 Sepsis due to Methicillin resistant Staphylococcus aureus: Secondary | ICD-10-CM

## 2022-09-21 DIAGNOSIS — E871 Hypo-osmolality and hyponatremia: Secondary | ICD-10-CM | POA: Diagnosis not present

## 2022-09-21 DIAGNOSIS — L03114 Cellulitis of left upper limb: Secondary | ICD-10-CM | POA: Diagnosis present

## 2022-09-21 DIAGNOSIS — M00022 Staphylococcal arthritis, left elbow: Secondary | ICD-10-CM | POA: Diagnosis present

## 2022-09-21 DIAGNOSIS — L02413 Cutaneous abscess of right upper limb: Secondary | ICD-10-CM

## 2022-09-21 DIAGNOSIS — I739 Peripheral vascular disease, unspecified: Secondary | ICD-10-CM | POA: Diagnosis present

## 2022-09-21 DIAGNOSIS — A419 Sepsis, unspecified organism: Secondary | ICD-10-CM | POA: Diagnosis not present

## 2022-09-21 DIAGNOSIS — E870 Hyperosmolality and hypernatremia: Secondary | ICD-10-CM | POA: Diagnosis not present

## 2022-09-21 DIAGNOSIS — R6521 Severe sepsis with septic shock: Secondary | ICD-10-CM | POA: Diagnosis not present

## 2022-09-21 DIAGNOSIS — J440 Chronic obstructive pulmonary disease with acute lower respiratory infection: Secondary | ICD-10-CM | POA: Diagnosis present

## 2022-09-21 DIAGNOSIS — B9562 Methicillin resistant Staphylococcus aureus infection as the cause of diseases classified elsewhere: Secondary | ICD-10-CM | POA: Diagnosis not present

## 2022-09-21 DIAGNOSIS — Z87891 Personal history of nicotine dependence: Secondary | ICD-10-CM

## 2022-09-21 DIAGNOSIS — I129 Hypertensive chronic kidney disease with stage 1 through stage 4 chronic kidney disease, or unspecified chronic kidney disease: Secondary | ICD-10-CM | POA: Diagnosis present

## 2022-09-21 DIAGNOSIS — J15212 Pneumonia due to Methicillin resistant Staphylococcus aureus: Secondary | ICD-10-CM | POA: Diagnosis present

## 2022-09-21 DIAGNOSIS — D638 Anemia in other chronic diseases classified elsewhere: Secondary | ICD-10-CM | POA: Diagnosis present

## 2022-09-21 DIAGNOSIS — R7881 Bacteremia: Secondary | ICD-10-CM | POA: Diagnosis present

## 2022-09-21 DIAGNOSIS — J449 Chronic obstructive pulmonary disease, unspecified: Secondary | ICD-10-CM

## 2022-09-21 DIAGNOSIS — I330 Acute and subacute infective endocarditis: Secondary | ICD-10-CM | POA: Diagnosis present

## 2022-09-21 DIAGNOSIS — R0902 Hypoxemia: Secondary | ICD-10-CM | POA: Diagnosis present

## 2022-09-21 DIAGNOSIS — N170 Acute kidney failure with tubular necrosis: Secondary | ICD-10-CM | POA: Diagnosis not present

## 2022-09-21 DIAGNOSIS — J9622 Acute and chronic respiratory failure with hypercapnia: Secondary | ICD-10-CM | POA: Diagnosis present

## 2022-09-21 DIAGNOSIS — J441 Chronic obstructive pulmonary disease with (acute) exacerbation: Secondary | ICD-10-CM | POA: Diagnosis present

## 2022-09-21 DIAGNOSIS — I4719 Other supraventricular tachycardia: Secondary | ICD-10-CM | POA: Diagnosis not present

## 2022-09-21 DIAGNOSIS — J9601 Acute respiratory failure with hypoxia: Secondary | ICD-10-CM | POA: Diagnosis not present

## 2022-09-21 DIAGNOSIS — J1282 Pneumonia due to coronavirus disease 2019: Secondary | ICD-10-CM | POA: Diagnosis present

## 2022-09-21 DIAGNOSIS — J9621 Acute and chronic respiratory failure with hypoxia: Secondary | ICD-10-CM | POA: Diagnosis present

## 2022-09-21 DIAGNOSIS — J189 Pneumonia, unspecified organism: Secondary | ICD-10-CM | POA: Diagnosis not present

## 2022-09-21 DIAGNOSIS — Z66 Do not resuscitate: Secondary | ICD-10-CM | POA: Diagnosis present

## 2022-09-21 DIAGNOSIS — M25511 Pain in right shoulder: Secondary | ICD-10-CM | POA: Diagnosis present

## 2022-09-21 DIAGNOSIS — W19XXXA Unspecified fall, initial encounter: Secondary | ICD-10-CM | POA: Diagnosis present

## 2022-09-21 HISTORY — PX: I & D EXTREMITY: SHX5045

## 2022-09-21 HISTORY — PX: INCISION AND DRAINAGE OF WOUND: SHX1803

## 2022-09-21 LAB — BASIC METABOLIC PANEL
Anion gap: 11 (ref 5–15)
BUN: 37 mg/dL — ABNORMAL HIGH (ref 8–23)
CO2: 26 mmol/L (ref 22–32)
Calcium: 8 mg/dL — ABNORMAL LOW (ref 8.9–10.3)
Chloride: 99 mmol/L (ref 98–111)
Creatinine, Ser: 1.27 mg/dL — ABNORMAL HIGH (ref 0.44–1.00)
GFR, Estimated: 41 mL/min — ABNORMAL LOW (ref >60)
Glucose, Bld: 193 mg/dL — ABNORMAL HIGH (ref 70–99)
Potassium: 3.5 mmol/L (ref 3.5–5.1)
Sodium: 136 mmol/L (ref 135–145)

## 2022-09-21 LAB — CBC WITH DIFFERENTIAL/PLATELET
Abs Immature Granulocytes: 0 10*3/uL (ref 0.00–0.07)
Basophils Absolute: 0 10*3/uL (ref 0.0–0.1)
Basophils Relative: 0 %
Eosinophils Absolute: 0 10*3/uL (ref 0.0–0.5)
Eosinophils Relative: 0 %
HCT: 32.9 % — ABNORMAL LOW (ref 36.0–46.0)
Hemoglobin: 10.8 g/dL — ABNORMAL LOW (ref 12.0–15.0)
Lymphocytes Relative: 25 %
Lymphs Abs: 14.4 10*3/uL — ABNORMAL HIGH (ref 0.7–4.0)
MCH: 30.9 pg (ref 26.0–34.0)
MCHC: 32.8 g/dL (ref 30.0–36.0)
MCV: 94 fL (ref 80.0–100.0)
Monocytes Absolute: 1.7 10*3/uL — ABNORMAL HIGH (ref 0.1–1.0)
Monocytes Relative: 3 %
Neutro Abs: 41.4 10*3/uL — ABNORMAL HIGH (ref 1.7–7.7)
Neutrophils Relative %: 72 %
Platelets: 254 10*3/uL (ref 150–400)
RBC: 3.5 MIL/uL — ABNORMAL LOW (ref 3.87–5.11)
RDW: 14.5 % (ref 11.5–15.5)
WBC: 57.5 10*3/uL (ref 4.0–10.5)
nRBC: 0 % (ref 0.0–0.2)
nRBC: 0 /100 WBC

## 2022-09-21 LAB — BLOOD CULTURE ID PANEL (REFLEXED) - BCID2

## 2022-09-21 LAB — CK: Total CK: 72 U/L (ref 38–234)

## 2022-09-21 LAB — PATHOLOGIST SMEAR REVIEW

## 2022-09-21 LAB — GLUCOSE, CAPILLARY
Glucose-Capillary: 141 mg/dL — ABNORMAL HIGH (ref 70–99)
Glucose-Capillary: 146 mg/dL — ABNORMAL HIGH (ref 70–99)

## 2022-09-21 LAB — MAGNESIUM: Magnesium: 1.5 mg/dL — ABNORMAL LOW (ref 1.7–2.4)

## 2022-09-21 LAB — SURGICAL PCR SCREEN
MRSA, PCR: POSITIVE — AB
Staphylococcus aureus: POSITIVE — AB

## 2022-09-21 LAB — LACTIC ACID, PLASMA: Lactic Acid, Venous: 1.6 mmol/L (ref 0.5–1.9)

## 2022-09-21 SURGERY — INCISION AND DRAINAGE, ABSCESS
Anesthesia: Choice | Laterality: Right

## 2022-09-21 SURGERY — IRRIGATION AND DEBRIDEMENT EXTREMITY
Anesthesia: General | Site: Shoulder | Laterality: Right

## 2022-09-21 MED ORDER — SODIUM CHLORIDE 0.9 % IV SOLN
250.0000 mL | INTRAVENOUS | Status: DC
  Administered 2022-09-21: 250 mL via INTRAVENOUS

## 2022-09-21 MED ORDER — METOPROLOL TARTRATE 5 MG/5ML IV SOLN
5.0000 mg | Freq: Once | INTRAVENOUS | Status: AC
  Administered 2022-09-21: 1 mg via INTRAVENOUS

## 2022-09-21 MED ORDER — FENTANYL CITRATE (PF) 250 MCG/5ML IJ SOLN
INTRAMUSCULAR | Status: AC
  Filled 2022-09-21: qty 5

## 2022-09-21 MED ORDER — MIDAZOLAM HCL 2 MG/2ML IJ SOLN
1.0000 mg | INTRAMUSCULAR | Status: DC | PRN
  Filled 2022-09-21: qty 2

## 2022-09-21 MED ORDER — SODIUM CHLORIDE 0.9 % IR SOLN
Status: DC | PRN
  Administered 2022-09-21: 1000 mL
  Administered 2022-09-21: 1

## 2022-09-21 MED ORDER — VANCOMYCIN HCL 1250 MG/250ML IV SOLN
1250.0000 mg | INTRAVENOUS | Status: DC
  Administered 2022-09-22: 1250 mg via INTRAVENOUS
  Filled 2022-09-21: qty 250

## 2022-09-21 MED ORDER — HEPARIN (PORCINE) 25000 UT/250ML-% IV SOLN
1400.0000 [IU]/h | INTRAVENOUS | Status: DC
  Administered 2022-09-21: 800 [IU]/h via INTRAVENOUS
  Administered 2022-09-23: 1200 [IU]/h via INTRAVENOUS
  Administered 2022-09-23: 1350 [IU]/h via INTRAVENOUS
  Administered 2022-09-24 – 2022-09-25 (×2): 1400 [IU]/h via INTRAVENOUS
  Filled 2022-09-21 (×5): qty 250

## 2022-09-21 MED ORDER — ACETAMINOPHEN 325 MG PO TABS
650.0000 mg | ORAL_TABLET | Freq: Four times a day (QID) | ORAL | Status: DC
  Administered 2022-09-21: 650 mg via ORAL
  Filled 2022-09-21 (×2): qty 2

## 2022-09-21 MED ORDER — IPRATROPIUM BROMIDE 0.02 % IN SOLN
0.5000 mg | Freq: Four times a day (QID) | RESPIRATORY_TRACT | Status: DC
  Administered 2022-09-22 (×2): 0.5 mg via RESPIRATORY_TRACT
  Filled 2022-09-21 (×2): qty 2.5

## 2022-09-21 MED ORDER — FENTANYL CITRATE (PF) 100 MCG/2ML IJ SOLN: 25.0000 ug | INTRAMUSCULAR | Status: DC | PRN

## 2022-09-21 MED ORDER — NOREPINEPHRINE 4 MG/250ML-% IV SOLN: 2.0000 ug/min | INTRAVENOUS | Status: DC

## 2022-09-21 MED ORDER — BUPIVACAINE HCL (PF) 0.5 % IJ SOLN
10.0000 mL | Freq: Once | INTRAMUSCULAR | Status: AC
  Administered 2022-09-21: 10 mL
  Filled 2022-09-21: qty 10

## 2022-09-21 MED ORDER — PROPOFOL 10 MG/ML IV BOLUS
INTRAVENOUS | Status: DC | PRN
  Administered 2022-09-21: 50 mg via INTRAVENOUS

## 2022-09-21 MED ORDER — PROPOFOL 500 MG/50ML IV EMUL
INTRAVENOUS | Status: DC | PRN
  Administered 2022-09-21: 50 ug/kg/min via INTRAVENOUS

## 2022-09-21 MED ORDER — ACETAMINOPHEN 325 MG PO TABS
650.0000 mg | ORAL_TABLET | Freq: Four times a day (QID) | ORAL | Status: DC | PRN
  Administered 2022-09-21: 650 mg via ORAL
  Filled 2022-09-21: qty 2

## 2022-09-21 MED ORDER — PROPOFOL 10 MG/ML IV BOLUS
INTRAVENOUS | Status: AC
  Filled 2022-09-21: qty 20

## 2022-09-21 MED ORDER — OXYCODONE HCL 5 MG PO TABS
5.0000 mg | ORAL_TABLET | Freq: Four times a day (QID) | ORAL | Status: DC | PRN
  Administered 2022-09-21: 5 mg via ORAL
  Filled 2022-09-21: qty 1

## 2022-09-21 MED ORDER — SODIUM CHLORIDE 0.9 % IV SOLN
2.0000 g | INTRAVENOUS | Status: DC
  Administered 2022-09-21: 2 g via INTRAVENOUS
  Filled 2022-09-21: qty 12.5

## 2022-09-21 MED ORDER — FENTANYL BOLUS VIA INFUSION
25.0000 ug | INTRAVENOUS | Status: DC | PRN
  Administered 2022-09-22: 50 ug via INTRAVENOUS
  Administered 2022-09-22: 100 ug via INTRAVENOUS
  Administered 2022-09-22: 50 ug via INTRAVENOUS

## 2022-09-21 MED ORDER — ENOXAPARIN SODIUM 30 MG/0.3ML IJ SOSY: 30.0000 mg | PREFILLED_SYRINGE | INTRAMUSCULAR | Status: DC

## 2022-09-21 MED ORDER — FENTANYL CITRATE (PF) 250 MCG/5ML IJ SOLN
INTRAMUSCULAR | Status: DC | PRN
  Administered 2022-09-21: 25 ug via INTRAVENOUS

## 2022-09-21 MED ORDER — FENTANYL 2500MCG IN NS 250ML (10MCG/ML) PREMIX INFUSION
25.0000 ug/h | INTRAVENOUS | Status: DC
  Administered 2022-09-21: 25 ug/h via INTRAVENOUS
  Filled 2022-09-21: qty 250

## 2022-09-21 MED ORDER — FLUTICASONE FUROATE-VILANTEROL 100-25 MCG/ACT IN AEPB
1.0000 | INHALATION_SPRAY | Freq: Every day | RESPIRATORY_TRACT | Status: DC
  Filled 2022-09-21: qty 28

## 2022-09-21 MED ORDER — ACETAMINOPHEN 160 MG/5ML PO SOLN
650.0000 mg | Freq: Four times a day (QID) | ORAL | Status: DC
  Administered 2022-09-22 – 2022-09-24 (×9): 650 mg
  Filled 2022-09-21 (×12): qty 20.3

## 2022-09-21 MED ORDER — DOCUSATE SODIUM 50 MG/5ML PO LIQD
100.0000 mg | Freq: Two times a day (BID) | ORAL | Status: DC
  Administered 2022-09-22 – 2022-09-23 (×5): 100 mg
  Filled 2022-09-21 (×5): qty 10

## 2022-09-21 MED ORDER — ARFORMOTEROL TARTRATE 15 MCG/2ML IN NEBU
15.0000 ug | INHALATION_SOLUTION | Freq: Two times a day (BID) | RESPIRATORY_TRACT | Status: DC
  Administered 2022-09-22 – 2022-10-03 (×24): 15 ug via RESPIRATORY_TRACT
  Filled 2022-09-21 (×25): qty 2

## 2022-09-21 MED ORDER — ROCURONIUM BROMIDE 10 MG/ML (PF) SYRINGE
PREFILLED_SYRINGE | INTRAVENOUS | Status: DC | PRN
  Administered 2022-09-21: 50 mg via INTRAVENOUS

## 2022-09-21 MED ORDER — CHLORHEXIDINE GLUCONATE 0.12 % MT SOLN
OROMUCOSAL | Status: AC
  Filled 2022-09-21: qty 15

## 2022-09-21 MED ORDER — SODIUM CHLORIDE 0.9 % IV SOLN: INTRAVENOUS | Status: DC

## 2022-09-21 MED ORDER — ORAL CARE MOUTH RINSE: 15.0000 mL | OROMUCOSAL | Status: DC | PRN

## 2022-09-21 MED ORDER — DEXTROSE 50 % IV SOLN: 12.5000 g | INTRAVENOUS | Status: DC

## 2022-09-21 MED ORDER — NOREPINEPHRINE 4 MG/250ML-% IV SOLN
INTRAVENOUS | Status: AC
  Administered 2022-09-21: 2 ug/min via INTRAVENOUS
  Filled 2022-09-21: qty 250

## 2022-09-21 MED ORDER — POLYETHYLENE GLYCOL 3350 17 G PO PACK
17.0000 g | PACK | Freq: Every day | ORAL | Status: DC
  Administered 2022-09-22 – 2022-09-23 (×2): 17 g
  Filled 2022-09-21 (×2): qty 1

## 2022-09-21 MED ORDER — FENTANYL CITRATE PF 50 MCG/ML IJ SOSY
25.0000 ug | PREFILLED_SYRINGE | Freq: Once | INTRAMUSCULAR | Status: AC
  Administered 2022-09-21: 25 ug via INTRAVENOUS
  Filled 2022-09-21 (×2): qty 1

## 2022-09-21 MED ORDER — POVIDONE-IODINE 10 % EX SWAB: 2.0000 | Freq: Once | CUTANEOUS | Status: DC

## 2022-09-21 MED ORDER — CHLORHEXIDINE GLUCONATE 4 % EX LIQD: 60.0000 mL | Freq: Once | CUTANEOUS | Status: DC

## 2022-09-21 MED ORDER — CHLORHEXIDINE GLUCONATE 0.12 % MT SOLN: 15.0000 mL | Freq: Once | OROMUCOSAL | Status: DC

## 2022-09-21 MED ORDER — METOPROLOL TARTRATE 5 MG/5ML IV SOLN
INTRAVENOUS | Status: AC
  Filled 2022-09-21: qty 5

## 2022-09-21 MED ORDER — UMECLIDINIUM BROMIDE 62.5 MCG/ACT IN AEPB
1.0000 | INHALATION_SPRAY | Freq: Every day | RESPIRATORY_TRACT | Status: DC
  Filled 2022-09-21: qty 7

## 2022-09-21 MED ORDER — IOHEXOL 350 MG/ML SOLN
75.0000 mL | Freq: Once | INTRAVENOUS | Status: AC | PRN
  Administered 2022-09-21: 75 mL via INTRAVENOUS

## 2022-09-21 MED ORDER — VANCOMYCIN HCL 1250 MG/250ML IV SOLN
1250.0000 mg | Freq: Once | INTRAVENOUS | Status: AC
  Administered 2022-09-21: 1250 mg via INTRAVENOUS
  Filled 2022-09-21: qty 250

## 2022-09-21 MED ORDER — PHENYLEPHRINE HCL-NACL 20-0.9 MG/250ML-% IV SOLN
INTRAVENOUS | Status: DC | PRN
  Administered 2022-09-21: 50 ug/min via INTRAVENOUS

## 2022-09-21 MED ORDER — MAGNESIUM SULFATE 2 GM/50ML IV SOLN
2.0000 g | Freq: Once | INTRAVENOUS | Status: AC
  Administered 2022-09-21: 2 g via INTRAVENOUS
  Filled 2022-09-21: qty 50

## 2022-09-21 MED ORDER — PANTOPRAZOLE SODIUM 40 MG IV SOLR
40.0000 mg | Freq: Every day | INTRAVENOUS | Status: DC
  Administered 2022-09-22 – 2022-09-24 (×3): 40 mg via INTRAVENOUS
  Filled 2022-09-21 (×4): qty 10

## 2022-09-21 MED ORDER — CHLORHEXIDINE GLUCONATE CLOTH 2 % EX PADS
6.0000 | MEDICATED_PAD | Freq: Every day | CUTANEOUS | Status: DC
  Administered 2022-09-21 – 2022-10-03 (×12): 6 via TOPICAL

## 2022-09-21 MED ORDER — 0.9 % SODIUM CHLORIDE (POUR BTL) OPTIME
TOPICAL | Status: DC | PRN
  Administered 2022-09-21: 1000 mL

## 2022-09-21 MED ORDER — ACETAMINOPHEN 650 MG RE SUPP: 650.0000 mg | Freq: Four times a day (QID) | RECTAL | Status: DC | PRN

## 2022-09-21 MED ORDER — LIDOCAINE 2% (20 MG/ML) 5 ML SYRINGE
INTRAMUSCULAR | Status: DC | PRN
  Administered 2022-09-21: 40 mg via INTRAVENOUS

## 2022-09-21 MED ORDER — BUDESONIDE 0.5 MG/2ML IN SUSP
0.5000 mg | Freq: Two times a day (BID) | RESPIRATORY_TRACT | Status: DC
  Administered 2022-09-22 – 2022-10-03 (×24): 0.5 mg via RESPIRATORY_TRACT
  Filled 2022-09-21 (×24): qty 2

## 2022-09-21 MED ORDER — MUPIROCIN 2 % EX OINT
1.0000 | TOPICAL_OINTMENT | Freq: Two times a day (BID) | CUTANEOUS | Status: AC
  Administered 2022-09-22 – 2022-09-26 (×10): 1 via NASAL
  Filled 2022-09-21 (×3): qty 22

## 2022-09-21 MED ORDER — PHENYLEPHRINE 80 MCG/ML (10ML) SYRINGE FOR IV PUSH (FOR BLOOD PRESSURE SUPPORT)
PREFILLED_SYRINGE | INTRAVENOUS | Status: DC | PRN
  Administered 2022-09-21: 80 ug via INTRAVENOUS

## 2022-09-21 MED ORDER — ORAL CARE MOUTH RINSE
15.0000 mL | OROMUCOSAL | Status: DC
  Administered 2022-09-21 – 2022-09-24 (×37): 15 mL via OROMUCOSAL

## 2022-09-21 MED ORDER — VANCOMYCIN VARIABLE DOSE PER UNSTABLE RENAL FUNCTION (PHARMACIST DOSING): Status: DC

## 2022-09-21 MED ORDER — LACTATED RINGERS IV SOLN: INTRAVENOUS | Status: DC

## 2022-09-21 MED ORDER — CEFAZOLIN SODIUM-DEXTROSE 2-3 GM-%(50ML) IV SOLR
INTRAVENOUS | Status: DC | PRN
  Administered 2022-09-21: 2 g via INTRAVENOUS

## 2022-09-21 MED ORDER — ORAL CARE MOUTH RINSE: 15.0000 mL | Freq: Once | OROMUCOSAL | Status: DC

## 2022-09-21 SURGICAL SUPPLY — 38 items
BAG COUNTER SPONGE SURGICOUNT (BAG) IMPLANT
BAG SPNG CNTER NS LX DISP (BAG)
BLADE SURG 10 STRL SS (BLADE) IMPLANT
BLADE SURG 15 STRL LF DISP TIS (BLADE) IMPLANT
BLADE SURG 15 STRL SS (BLADE) ×2
BLADE SURG 21 STRL SS (BLADE) ×3 IMPLANT
BNDG COHESIVE 6X5 TAN STRL LF (GAUZE/BANDAGES/DRESSINGS) IMPLANT
BNDG ELASTIC 4X5.8 VLCR STR LF (GAUZE/BANDAGES/DRESSINGS) IMPLANT
BNDG GAUZE DERMACEA FLUFF 4 (GAUZE/BANDAGES/DRESSINGS) ×6 IMPLANT
BNDG GZE DERMACEA 4 6PLY (GAUZE/BANDAGES/DRESSINGS) ×4
COVER SURGICAL LIGHT HANDLE (MISCELLANEOUS) ×6 IMPLANT
DRAPE ORTHO SPLIT 77X108 STRL (DRAPES) ×4
DRAPE SURG ORHT 6 SPLT 77X108 (DRAPES) IMPLANT
DRAPE U-SHAPE 47X51 STRL (DRAPES) ×3 IMPLANT
DRSG ADAPTIC 3X8 NADH LF (GAUZE/BANDAGES/DRESSINGS) ×3 IMPLANT
DURAPREP 26ML APPLICATOR (WOUND CARE) ×3 IMPLANT
ELECT REM PT RETURN 9FT ADLT (ELECTROSURGICAL)
ELECTRODE REM PT RTRN 9FT ADLT (ELECTROSURGICAL) IMPLANT
GAUZE PACKING IODOFORM 1/4X15 (PACKING) IMPLANT
GAUZE SPONGE 4X4 12PLY STRL (GAUZE/BANDAGES/DRESSINGS) ×3 IMPLANT
GLOVE SURG ORTHO 9.0 STRL STRW (GLOVE) ×3 IMPLANT
GOWN STRL REUS W/ TWL XL LVL3 (GOWN DISPOSABLE) ×6 IMPLANT
GOWN STRL REUS W/TWL XL LVL3 (GOWN DISPOSABLE) ×4
KIT BASIN OR (CUSTOM PROCEDURE TRAY) ×3 IMPLANT
KIT TURNOVER KIT B (KITS) ×3 IMPLANT
NS IRRIG 1000ML POUR BTL (IV SOLUTION) ×3 IMPLANT
PACK ORTHO EXTREMITY (CUSTOM PROCEDURE TRAY) ×3 IMPLANT
PAD ARMBOARD 7.5X6 YLW CONV (MISCELLANEOUS) ×6 IMPLANT
SET IRRIG Y TYPE TUR BLADDER L (SET/KITS/TRAYS/PACK) IMPLANT
STOCKINETTE IMPERVIOUS 9X36 MD (GAUZE/BANDAGES/DRESSINGS) IMPLANT
SUT ETHILON 2 0 PSLX (SUTURE) ×3 IMPLANT
SUT ETHILON 3 0 PS 1 (SUTURE) IMPLANT
SWAB COLLECTION DEVICE MRSA (MISCELLANEOUS) ×3 IMPLANT
SWAB CULTURE ESWAB REG 1ML (MISCELLANEOUS) IMPLANT
TAPE CLOTH 4X10 WHT NS (GAUZE/BANDAGES/DRESSINGS) IMPLANT
TOWEL GREEN STERILE (TOWEL DISPOSABLE) ×3 IMPLANT
TUBE CONNECTING 12X1/4 (SUCTIONS) ×3 IMPLANT
YANKAUER SUCT BULB TIP NO VENT (SUCTIONS) ×3 IMPLANT

## 2022-09-21 NOTE — Progress Notes (Signed)
Pharmacy Antibiotic Note  Madeline Jimenez is a 87 y.o. female admitted on 09/20/2022 with sepsis.  Possible sources include R shoulder cellulitis vs secondary bacterial PNA in the setting of recent COVID infection.  Pharmacy has been consulted for Vancomycin and Cefepime dosing.  Patient also noted with elevated SCr 1.62 on admission (baseline ~ 1).  SCr has already improved to 1.27 with IVF.  Patient received Vancomycin, Ceftriaxone, and Azithromycin  single doses in the ED.  Plan: Start Vancomycin 1250mg  IV q48h - next dose 12/8 at 2200 Estimated AUC 542 (goal 400-550) using SCr 1.27 Cefepime 2gm IV q24h Follow-up culture data, renal function, and clinical progress  Height: 5\' 4"  (162.6 cm) Weight: 58 kg (127 lb 13.9 oz) IBW/kg (Calculated) : 54.7  Temp (24hrs), Avg:98.6 F (37 C), Min:98 F (36.7 C), Max:99.1 F (37.3 C)  Recent Labs  Lab 09/20/22 2208 09/21/22 0200  WBC 66.9* 57.5*  CREATININE 1.62* 1.27*  LATICACIDVEN 2.8* 1.6    Estimated Creatinine Clearance: 28 mL/min (A) (by C-G formula based on SCr of 1.27 mg/dL (H)).    No Known Allergies  Antimicrobials this admission: Vanc 12/7 >>  Cefepime 12/7 >>  Ceftriaxone x 1 12/6 Azithro x 1 12/6  Dose adjustments this admission:   Microbiology results: 12/6 BCx:    Thank you for allowing pharmacy to be a part of this patient's care.  14/6, Pharm.D., BCPS Clinical Pharmacist Clinical phone for 09/21/2022 from 7:30-3:00 is [phone].  **Pharmacist phone directory can be found on [email or web address] listed under [facility].  09/21/2022 12:59 PM

## 2022-09-21 NOTE — ED Notes (Signed)
RT at bedside; patient on 4L oxygen Nasal cannula

## 2022-09-21 NOTE — ED Notes (Addendum)
This RN spoke with Madeline Jimenez, provider with the hospitalist team while in the ED. She reports that the hospitalist team is comfortable with giving the patient IVF and they are aware of the patient's consistent tachycardia and wet lung sounds. Madeline Jimenez reports to this RN that she believes that the patient's adventitious lung sounds is likely related to mucous excess.

## 2022-09-21 NOTE — Progress Notes (Signed)
An USGPIV (ultrasound guided PIV) has been placed for short-term vasopressor infusion. A correctly placed ivWatch must be used when administering Vasopressors. Should this treatment be needed beyond 72 hours, central line access should be obtained.  It will be the responsibility of the bedside nurse to follow best practice to prevent extravasations.   ?

## 2022-09-21 NOTE — Hospital Course (Addendum)
Madeline Jimenez is a 87 y.o.female with a history of recent COVID admission, COPD, HTN who was admitted to the [facility] Medicine Teaching Service at [facility] for Sepsis and acute hypoxic respiratory failure. Her hospital course is detailed below:  Sepsis, disseminated MRSA bacteremia from several potential sources Right shoulder abscess Left forearm cellulitis  Patient was admitted meeting SIRS criteria given tachycardia, tachypnea, and profound leukocytosis. She had multiple possible sources of infection, including PNA given consolidations on CXR, right upper shoulder septic arthritis/abscess, left arm cellulitis. She was started on broad spectrum antibiotics on 12/7. CT of right shoulder/neck showed right shoulder abscess and pulmonary embolism. Blood cultures were positive for MRSA.  ID was consulted and Cefepime was stopped.  Ortho was consulted for abscess on right shoulder and performed I&D for right shoulder abscess and left forearm cellulitis on 12/7. Echo did not show any endocarditis. Repeat blood cultures were negative. She was transitioned from vancomycin to daptomycin and doxycyline on 12/12. Patient remained afebrile and stable on the floor. It was suspected patient most likely has septic thrombophlebitis or septic endocarditis with emboli despite negative echo. She had a tunneled catheter placed by IR on 10/02/22 for long term IV antibiotic treatment as recommended by ID. Patient discharged afebrile on Daptomycin and doxycyline, see end dates below.  Acute Hypoxic Respiratory Failure  MRSA Cavitary PNA Bilateral Segmental PE  On admission patient required 5L (home oxygen requirement of 2L).  Multifactorial causes in the setting of COPD exacerbation and cavitary pneumonia. CTA showed bilateral segmental PE as well as cavitary pneumonia. PCCM was consulted.  Patient was started on heparin gtt. Status post I&D patient was unable to be extubated and was admitted to the ICU for ventilatory and pressor  support.  Patient was successfully weaned off levophed and extubated. Patient transitioned from heparin to Eliquis on 12/11. Patient's respiratory support was slowly weaned from BiPAP to nasal cannula. Patient weaned to 2L [place] of home baseline and discharged in stable condition on 2L [place].  Acute kidney injury Initial creatinine on admission 1.6, thought to be prerenal in the setting of sepsis. Initial improvement with fluid resuscitation and starting treatment of sepsis.  Per critical care and ID consultation, acute kidney injury thought to be due to acute tubular necrosis 2/2 sepsis as well as vancomycin toxicity, as UA on 12/10 was positive for hemoglobin. Creatinine returned to near baseline by discharge in accordance with antibiotic transition off vancomycin.  Other chronic conditions were medically managed with home medications and formulary alternatives as necessary.  PCP Follow-up Recommendations: Will have 2 weeks outpatient doxy, 6 weeks outpatient daptomycin. End Date:12/26 for doxycycline,11/06/21 for daptomycin  F/u CT Chest in 2-3 months to check on cavitary lesions CTPA: Given the relatively focal appearance of esophagus, consider dedicated esophageal evaluation on a nonemergent basis. This would be best done outside of the acute setting. Repeat CBC and BMP outpatient monitor Hemoglobin and Creatinine Wean off of Oxycodone Minimum 3 months on Eliquis for PE. Re-evaluate long term anticoagulation, likely provoked in setting of COVID.

## 2022-09-21 NOTE — Transfer of Care (Signed)
Immediate Anesthesia Transfer of Care Note  Patient: Madeline Jimenez  Procedure(s) Performed: IRRIGATION AND DEBRIDEMENT OF SHOULDER ABSCESS (Right: Shoulder) IRRIGATION AND DEBRIDEMENT LEFT FORARM (Left: Leg Lower)  Patient Location: PACU  Anesthesia Type:General  Level of Consciousness: Patient remains intubated per anesthesia plan  Airway & Oxygen Therapy: Patient remains intubated per anesthesia plan and Patient placed on Ventilator (see vital sign flow sheet for setting)  Post-op Assessment: Report given to RN and Post -op Vital signs reviewed and stable  Post vital signs: Reviewed and stable  Last Vitals:  Vitals Value Taken Time  BP 165/80 09/21/22 1919  Temp    Pulse 123 09/21/22 1924  Resp 15 09/21/22 1924  SpO2 100 % 09/21/22 1924  Vitals shown include unvalidated device data.  Last Pain:  Vitals:   09/21/22 1731  TempSrc:   PainSc: 0-No pain         Complications: No notable events documented.

## 2022-09-21 NOTE — Anesthesia Preprocedure Evaluation (Addendum)
Anesthesia Evaluation  Patient identified by MRN, date of birth, ID band Patient confused    Reviewed: Allergy & Precautions, NPO status , Patient's Chart, lab work & pertinent test results, Unable to perform ROS - Chart review only  Airway Mallampati: Unable to assess       Dental  (+) Edentulous Upper, Missing   Pulmonary COPD,  COPD inhaler and oxygen dependent, former smoker   + rhonchi  + decreased breath sounds  rales    Cardiovascular hypertension, + Peripheral Vascular Disease   Rhythm:Regular Rate:Tachycardia  Echo (2017): Left ventricular hypertrophy - mild   Normal left ventricular systolic function, ejection fraction 60 to 65%   Diastolic dysfunction - grade I (normal filling pressures)   Mitral annular calcification   Aortic sclerosis   Mildly elevated right atrial pressure   Normal right ventricular systolic function   Pericardial effusion - small     Neuro/Psych negative neurological ROS  negative psych ROS   GI/Hepatic negative GI ROS, Neg liver ROS,,,  Endo/Other  negative endocrine ROS    Renal/GU Renal disease     Musculoskeletal negative musculoskeletal ROS (+)    Abdominal   Peds  Hematology negative hematology ROS (+)   Anesthesia Other Findings   Reproductive/Obstetrics                             Anesthesia Physical Anesthesia Plan  ASA: 4 and emergent  Anesthesia Plan: General   Post-op Pain Management: Ofirmev IV (intra-op)*   Induction: Intravenous  PONV Risk Score and Plan: 3 and Ondansetron and Treatment may vary due to age or medical condition  Airway Management Planned: Oral ETT  Additional Equipment: None  Intra-op Plan:   Post-operative Plan: Possible Post-op intubation/ventilation  Informed Consent: I have reviewed the patients History and Physical, chart, labs and discussed the procedure including the risks, benefits and  alternatives for the proposed anesthesia with the patient or authorized representative who has indicated his/her understanding and acceptance.   Patient has DNR.  Discussed DNR with power of attorney and Suspend DNR.   Dental advisory given  Plan Discussed with: CRNA  Anesthesia Plan Comments: (Detailed conversation with family regarding patient's current DNR and pulmonary status. Family ok with patient staying intubated after the procedure to allow for better treatment of pain and pulmonary optimization. )       Anesthesia Quick Evaluation

## 2022-09-21 NOTE — Plan of Care (Signed)

## 2022-09-21 NOTE — Anesthesia Postprocedure Evaluation (Signed)
Anesthesia Post Note  Patient: Madeline Jimenez  Procedure(s) Performed: IRRIGATION AND DEBRIDEMENT OF SHOULDER ABSCESS (Right: Shoulder) IRRIGATION AND DEBRIDEMENT LEFT FORARM (Left: Leg Lower)     Patient location during evaluation: PACU Anesthesia Type: General Level of consciousness: sedated Pain management: pain level controlled Vital Signs Assessment: post-procedure vital signs reviewed and stable Respiratory status: patient remains intubated per anesthesia plan Cardiovascular status: stable Postop Assessment: no apparent nausea or vomiting Anesthetic complications: no   No notable events documented.  Last Vitals:  Vitals:   09/21/22 2015 09/21/22 2047  BP: (!) 89/45   Pulse: 62   Resp: 15   Temp:  36.5 C  SpO2: 97%     Last Pain:  Vitals:   09/21/22 2047  TempSrc: Axillary  PainSc:                  Catheryn Bacon Alyne Martinson

## 2022-09-21 NOTE — H&P (Addendum)
Hospital Admission History and Physical Service Pager: [phone]  Patient name: Madeline Jimenez Medical record number: [id number] Date of Birth: October 29, 1934 Age: 87 y.o. Gender: female  Primary Care Provider: Evie Lacks, NP (Inactive) Consultants: None  Code Status: DNR Preferred Emergency Contact:   Niece preferred contact  Contact Information     Name Relation Home Work Mobile   Reberg,Susan Niece [phone]   Blaine, Guiffre [phone]  [phone]        Chief Complaint: Shortness of breath  Assessment and Plan: Madeline Jimenez is a 87 y.o. female presenting with shortness of breath and R shoulder pain. SOB likely secondary to superimprosed bacterial pneumonia given recent COVID infection and CXR finding of bilateral infiltrates. Differential for this patient's SOB presentation of this includes COPD exacerbation (less likely given not wheezing on exam). PMHX includes COPD, HTN and HLD.  * Sepsis [facility]) Patient meets SIRS criteria with tachycardia, tachypnea, significant leukocytosis to 66 with left shift and likely source of bilateral pneumonia. Likely superimposed bacterial pneumonia given recent COVID infection requiring hospitalization. Patient received Molnupiravir and Decadron for COVID during hospitalization on 11/28-11/30. Potentially other infection sources of cellulitis given clinical exam findings of erythematous, warm skin on L forearm and R shoulder. R/o urinary source given recent dysuria, UA/UC pending. Significant leukocytosis could be elevated secondary to prolonged steroid use in addition to underlying infection. -Admitted to FMTS service, Dr. Pollie Meyer attending -Started on CTX and Azithromycin for CAP coverage -Add on Vancomycin per potential cellulitis coverage -UA/UC pending -Repeat CBC with peripheral smear given significant leukocytosis -Start MIVF -Blood culture pending  Acute on chronic respiratory failure  (HCC) Requiring 5L upon admission when patient typically uses 2L O2 at home due to h/o COPD. Likely secondary to superimposed bacterial pneumonia. Copious mucus on exam. -Wean oxygen as tolerated with O2 sat goal 88-92% -Suctioning for mucus clearance -PNA treatment as specified above -Restart home inhalers Breo and Incruse  AKI (acute kidney injury) (HCC) Cr 1.6 on admission (baseline ~0.9). Likely prerenal secondary to dehydration. -Start on MIVF -Avoid nephrotoxic agents -Strict I&Os   Prolonged QT interval EKG showed borderline prolonged QT. Chronic problem for prior hospitalization. -Avoid QT prolonging medications -Hold home SSRI  Hypertension Normotensive upon admission. Could be falsely low in the setting of sepsis. -Hold home med Amlodipine  Right shoulder pain Started during prior hospitalization. Shoulder XR shows degenerative changes without acute fracture. -Pain management with Tylenol 650mg  q6h prn -PT/OT    FEN/GI: Regular VTE Prophylaxis: Lovenox  Disposition: Med surg  History of Present Illness:  Madeline Jimenez is a 87 y.o. female presenting with neck and shoulder pain and shortness of breath that have been ongoing since she left the hospital on 11/30. States she really came into the hospital to get rid of the neck and shoulder pain that started when she was hospitalized previously. States her shoulder and forearm have been red. States she has had a hard time breathing since being discharged and is unsure if her breathing got better or worse after hospitalization. Unsure if she has had an fevers. Does endorse pain with urination. Denies chest pain. Ambulates with a walker and a cane and performs ADLs independently at baseline.  Patient was recently hospitalized from 11/28-11/30 on FMTS service for acute on chronic respiratory failure found to be COVID positive. Started on Molnupiravir and Decadron. Currently taking Decadron. Denies taking her medications  tonight. Of notes she also had a fall on Thanksgiving prior to last hospitalization.  In  the ED, patient was worked up for sepsis and found to have bilateral opacities on CXR consistent with pneumonia. She was started on CTX and Azithromycin for CAP coverage. Given LR 1L bolus.  Review Of Systems: Per HPI with the following additions: neck pain, R shoulder pain, shortness of breath, dysuria  Pertinent Past Medical History: COPD HTN HLD Remainder reviewed in history tab.   Pertinent Past Surgical History: Abdominal hysterectomy Remainder reviewed in history tab.   Pertinent Social History: Tobacco use: No Alcohol use: 3 beers per day Other Substance use: None Lives with niece, her husband and their two children  Pertinent Family History: Father: heart attack, HTN Sister: Heart attack  Remainder reviewed in history tab.   Important Outpatient Medications: Amilodipine Albuterol Ascorbic Acid Lexapro 10mg  daily Thiamine 100mg  daily Trazodone 50mg  daily Trelegy Elliptal 1 puff daily Albuterol Neb PRN Remainder reviewed in medication history.   Objective: BP 123/67   Pulse (!) 122   Temp 98.8 F (37.1 C) (Oral)   Resp (!) 22   Ht 5\' 4"  (1.626 m)   Wt 58 kg   SpO2 98%   BMI 21.95 kg/m  Exam: General: Difficult of hearing. Alert Eyes: White sclera. PERRLA ENTM: Congestion. Dry mucus membrane Neck: Supple. Cardiovascular: Tachycardia. Normal rhythm Respiratory: Significant upper airway transmission for mucus. Normal WOB on 6L. No wheezing. Diminished breath sounds throughout Gastrointestinal: Soft, non-distended, non-tender. +BS MSK: Moves all extremities spontaneously. No peripheral edema Derm: Warm, dry. Area of erythema and warmth with crusted lesion on L forearm. Area of erythema and warmth over R shoulder. Neuro: Motor and sensation intact globally. Psych: Cooperative, slow to respond with intermittent confusion  Labs:  CBC BMET  Recent Labs  Lab  09/20/22 2208  WBC 66.9*  HGB 12.1  HCT 36.2  PLT 319   Recent Labs  Lab 09/20/22 2208  NA 136  K 3.8  CL 92*  CO2 31  BUN 44*  CREATININE 1.62*  GLUCOSE 275*  CALCIUM 9.5    Pertinent additional labs: Lactic acid: 2.8 PT: 18.6 INR: 1.6 BC pending Smear pending   EKG: Sinus tachycardia. QT prolongation   Imaging Studies Performed: DG Chest Port 1 View  Result Date: 09/20/2022 IMPRESSION: Patchy opacities in the upper lobes, right greater than left. This could reflect pneumonia. Peripheral opacity in the left upper lobe is somewhat nodular. Recommend follow up to clearance.  DG Chest 2 View  Result Date: 09/12/2022 IMPRESSION: 1. Small bilateral pleural effusions. 2. Mild cardiomegaly.     14/06/23, MD 09/21/2022, 1:30 AM PGY-1, [facility]  FPTS Intern pager: [phone], text pages welcome Secure chat group [facility] Teaching Service    FPTS Upper-Level Resident Addendum   I have independently interviewed and examined the patient. I have discussed the above with the original author and agree with their documentation. My edits for correction/addition/clarification are included.Please see also any attending notes.   [facility], D.O. PGY-3, [facility] 09/21/2022 1:31 AM  FPTS Service pager: [phone] (text pages welcome through [facility])

## 2022-09-21 NOTE — Op Note (Addendum)
Orthopedic Spine Surgery Operative Report  Procedure: Incision and drainage left forearm abscess Incision and drainage right shoulder abscess  Modifier: none  Date of procedure: 09/21/2022  Patient name: Madeline Jimenez MRN: [id number] DOB: [birth year]  Surgeon: Ileene Rubens, MD Assistant: None Pre-operative diagnosis: right shoulder and left forearm abscesses Post-operative diagnosis: same as above Findings: Purulent fluid collections at the left forearm and right shoulder  Specimens: 1 swab at each site, 2 specimens at right shoulder, 1 specimen at left forearm Anesthesia: general EBL: 54MG Complications: none Pre-incision antibiotic: none  Implants: none   Indication for procedure: Patient is a 87 y.o. female who was admitted to a medicine service for sepsis, shortness of breath, and concern for sepsis. Part of the work up revealed a right shoulder fluid collection and concern for abscess. On physical exam there was a draining purulent fluid collection on the left forearm. I discussed I&D with the POA - [name] - to treat these infections and to get specimen to help with antibiotic management. After discussion of the risks, benefits, and alternatives, the POA decided the patient would want me to proceed with the procedure.   Procedure Description: The patient was met in the pre-operative holding area. The patient's identity were verified. The patient's consent was verified with her niece (POA) - [name]. The operative site was marked. The patient was brought back to the operating room. General anesthesia was induced and an endotracheal tube was placed by the anesthesia staff. The patient was transferred to the operating table in the supine position. All bony prominences were well padded. He right arm was tucked and the left arm was extended out onto a hand table extension. The patient's skin was then prepped with betadine and draped in a standard, sterile fashion. A time  out was performed that identified the patient, the procedure, and the operative sites/laterality. All team members agreed with what was stated in the time out.   Attention was first turned to the left arm. There was a fluctuant mass with purulent drainage from a small puncture wound over the midshaft radius. An incision was made over the puncture site with the puncture site in the middle of the incision. The incision was taken down sharply through skin and dermis. A tenotomy scissors was used to open the deeper tissues bluntly. Purulent material was seen coming from the wound. A wound swab specimen was taken. A rongeur was used to collect some of the loose fat and soft tissue in the wound. This was placed into a specimen cup. The scissor were then used to further open the area bluntly. The abscess extended below the fascia. Further loose tissue was debrided with a curette and removed with a rongeur. A freer was used to probe for any remaining pockets or collections. There were no further remaining collections. There was no remaining loose or necrotic material within the wound. 500cc of sterile saline was irrigated through the wound with sterile saline.   Attention was then turned to the right shoulder. There was a large erythematous and fluctuance mass over the clavicle and AC joint. An incision was made directly over the fluctuance mass. Incision was taken sharply down through the skin and dermis. A tenotomy scissors was used to open up the wound further bluntly. Purulent material was seen coming out of the wound. A wound swab was used to collect some of this purulent material for culture. A rongeur was used to remove further loose and purulent material. This was placed into  a specimen cup for culture. The scissors was used to open up the wound further. The wound was palpated and a freer was used to fee for further pockets or fluid collections. There were no further fluid collections. A curette and a rongeur  was used to remove all loose and necrotic tissue. Some of this material was collected and placed into a specimen cup for culture. Since cultures had been taken, ancef was given at this time. The abscess extended down to the [facility] joint and clavicle and posteriorly to the area of the scapular spine. There was no further loose or necrotic tissue seen within the wound. 1.5L of sterile saline was irrigated through the wound via cysto tubing. 3-0 nylon was used to close the edges of the wound but the middle was left open.   Quarter inch iodoform gauze was packed into both of the wounds. 4x4 gauze was placed over the wounds. The left arm had an ace wrap placed over the dressings. There right shoulder had an ABD and tape placed over that wound. All counts were correct at the end of the case. The patient was transferred back to a hospital bed in stable condition.   Post-operative plan: Anesthesia's plan was to keep her intubated post-operatively so she returned to the PACU intubated and sedated in stable condition. The cultures will be followed to help with tailoring the antibiotic regimen. The medicine service can keep her on broad spectrum antibiotics for now. She can be weight bearing as tolerated. The packing on the left arm will be removed and replaced daily. The right shoulder iodoform packing will slowly be removed each day. She will go to the ICU from PACU and remain on a medicine service. Her disposition will be determined by the primary team.  Ileene Rubens Orthopedic Surgeon

## 2022-09-21 NOTE — Consult Note (Signed)
Reason for Consult:R/o right septic shoulder Referring Physician: Levert Feinstein Time called: 1139 Time at bedside: 1215   Madeline Jimenez is an 87 y.o. female.  HPI: [name] was admitted last night with sepsis. She c/o right shoulder pain and was noted to have a red shoulder. She also had left FA erythema and e/o PNA. She was recently dx with Covid. She is somewhat obtunded and SOB and cannot contribute well to history.  Past Medical History:  Diagnosis Date   COPD (chronic obstructive pulmonary disease) (HCC)    HTN (hypertension)    Hyperlipidemia    Hyponatremia    Lymphocytosis    Peripheral vascular disease (HCC)    Recurrent UTI     Past Surgical History:  Procedure Laterality Date   ABDOMINAL HYSTERECTOMY     for fibroid   TONSILECTOMY, ADENOIDECTOMY, BILATERAL MYRINGOTOMY AND TUBES      Family History  Problem Relation Age of Onset   Hypertension Father    Heart attack Father    Heart attack Sister     Social History:  reports that she has quit smoking. She has a 100.00 pack-year smoking history. She has never used smokeless tobacco. She reports that she does not drink alcohol and does not use drugs.  Allergies: No Known Allergies  Medications: I have reviewed the patient's current medications.  Results for orders placed or performed during the hospital encounter of 09/20/22 (from the past 48 hour(s))  Lactic acid, plasma     Status: Abnormal   Collection Time: 09/20/22 10:08 PM  Result Value Ref Range   Lactic Acid, Venous 2.8 (HH) 0.5 - 1.9 mmol/L    Comment: CRITICAL RESULT CALLED TO, READ BACK BY AND VERIFIED WITH Nicolasa Ducking RN 09/20/22 [address] Performed at [facility], [address], [place], [place] [address]   Comprehensive metabolic panel     Status: Abnormal   Collection Time: 09/20/22 10:08 PM  Result Value Ref Range   Sodium 136 135 - 145 mmol/L   Potassium 3.8 3.5 - 5.1 mmol/L   Chloride 92 (L) 98 - 111 mmol/L   CO2  31 22 - 32 mmol/L   Glucose, Bld 275 (H) 70 - 99 mg/dL    Comment: Glucose reference range applies only to samples taken after fasting for at least 8 hours.   BUN 44 (H) 8 - 23 mg/dL   Creatinine, Ser 5.95 (H) 0.44 - 1.00 mg/dL   Calcium 9.5 8.9 - 63.8 mg/dL   Total Protein 6.5 6.5 - 8.1 g/dL   Albumin 2.8 (L) 3.5 - 5.0 g/dL   AST 19 15 - 41 U/L   ALT 30 0 - 44 U/L   Alkaline Phosphatase 97 38 - 126 U/L   Total Bilirubin 0.5 0.3 - 1.2 mg/dL   GFR, Estimated 31 (L) >60 mL/min    Comment: (NOTE) Calculated using the CKD-EPI Creatinine Equation (2021)    Anion gap 13 5 - 15    Comment: Performed at [facility], [address], [place], [place] [address]  CBC with Differential     Status: Abnormal   Collection Time: 09/20/22 10:08 PM  Result Value Ref Range   WBC 66.9 (HH) 4.0 - 10.5 K/uL    Comment: REPEATED TO VERIFY THIS CRITICAL RESULT HAS VERIFIED AND BEEN CALLED TO ASHLEY TOWSLEY RN BY BERTRAM TAYLOR ON 12 06 2023 AT 2242, AND HAS BEEN READ BACK.     RBC 3.95 3.87 - 5.11 MIL/uL  Hemoglobin 12.1 12.0 - 15.0 g/dL   HCT 26.9 48.5 - 46.2 %   MCV 91.6 80.0 - 100.0 fL   MCH 30.6 26.0 - 34.0 pg   MCHC 33.4 30.0 - 36.0 g/dL   RDW 70.3 50.0 - 93.8 %   Platelets 319 150 - 400 K/uL   nRBC 0.0 0.0 - 0.2 %   Neutrophils Relative % 64 %   Neutro Abs 42.8 (H) 1.7 - 7.7 K/uL   Lymphocytes Relative 32 %   Lymphs Abs 21.4 (H) 0.7 - 4.0 K/uL   Monocytes Relative 3 %   Monocytes Absolute 2.0 (H) 0.1 - 1.0 K/uL   Eosinophils Relative 0 %   Eosinophils Absolute 0.0 0.0 - 0.5 K/uL   Basophils Relative 0 %   Basophils Absolute 0.0 0.0 - 0.1 K/uL   nRBC 0 0 /100 WBC   Myelocytes 1 %   Abs Immature Granulocytes 0.70 (H) 0.00 - 0.07 K/uL    Comment: Performed at [facility], [address], [place], [place] [address]  Protime-INR     Status: Abnormal   Collection Time: 09/20/22 10:08 PM  Result Value Ref Range   Prothrombin Time 18.6 (H) 11.4 - 15.2 seconds   INR 1.6 (H) 0.8  - 1.2    Comment: (NOTE) INR goal varies based on device and disease states. Performed at [facility], [address] [place], [place] [address]   APTT     Status: None   Collection Time: 09/20/22 10:08 PM  Result Value Ref Range   aPTT 28 24 - 36 seconds    Comment: Performed at [facility], [address], [place], [place] [address]  Blood Culture (routine x 2)     Status: None (Preliminary result)   Collection Time: 09/20/22 10:08 PM   Specimen: BLOOD  Result Value Ref Range   Specimen Description BLOOD RIGHT ANTECUBITAL    Special Requests      BOTTLES DRAWN AEROBIC AND ANAEROBIC Blood Culture results may not be optimal due to an excessive volume of blood received in culture bottles   Culture      NO GROWTH < 12 HOURS Performed at [facility], [address], [place], [place] [address]    Report Status PENDING   CK     Status: None   Collection Time: 09/20/22 10:08 PM  Result Value Ref Range   Total CK 72 38 - 234 U/L    Comment: Performed at [facility], [address], [place], [place] [address]  Lactic acid, plasma     Status: None   Collection Time: 09/21/22  2:00 AM  Result Value Ref Range   Lactic Acid, Venous 1.6 0.5 - 1.9 mmol/L    Comment: Performed at [facility], [address] [place], [place] [address]  Magnesium     Status: Abnormal   Collection Time: 09/21/22  2:00 AM  Result Value Ref Range   Magnesium 1.5 (L) 1.7 - 2.4 mg/dL    Comment: Performed at [facility], [address], [place], [place] [address]  CBC with Differential/Platelet     Status: Abnormal   Collection Time: 09/21/22  2:00 AM  Result Value Ref Range   WBC 57.5 (HH) 4.0 - 10.5 K/uL    Comment: CRITICAL VALUE NOTED.  VALUE IS CONSISTENT WITH PREVIOUSLY REPORTED AND CALLED VALUE. REPEATED TO VERIFY    RBC 3.50 (L) 3.87 - 5.11  MIL/uL   Hemoglobin 10.8 (L) 12.0 - 15.0 g/dL   HCT 16.132.9 (L) 09.636.0 - 04.546.0 %   MCV 94.0 80.0 - 100.0 fL   MCH 30.9  26.0 - 34.0 pg   MCHC 32.8 30.0 - 36.0 g/dL   RDW 40.914.5 81.111.5 - 91.415.5 %   Platelets 254 150 - 400 K/uL   nRBC 0.0 0.0 - 0.2 %   Neutrophils Relative % 72 %   Neutro Abs 41.4 (H) 1.7 - 7.7 K/uL   Lymphocytes Relative 25 %   Lymphs Abs 14.4 (H) 0.7 - 4.0 K/uL   Monocytes Relative 3 %   Monocytes Absolute 1.7 (H) 0.1 - 1.0 K/uL   Eosinophils Relative 0 %   Eosinophils Absolute 0.0 0.0 - 0.5 K/uL   Basophils Relative 0 %   Basophils Absolute 0.0 0.0 - 0.1 K/uL   nRBC 0 0 /100 WBC   Abs Immature Granulocytes 0.00 0.00 - 0.07 K/uL   Burr Cells PRESENT     Comment: Performed at [facility], [address] [place], [place] [address]  Basic metabolic panel     Status: Abnormal   Collection Time: 09/21/22  2:00 AM  Result Value Ref Range   Sodium 136 135 - 145 mmol/L   Potassium 3.5 3.5 - 5.1 mmol/L   Chloride 99 98 - 111 mmol/L   CO2 26 22 - 32 mmol/L   Glucose, Bld 193 (H) 70 - 99 mg/dL    Comment: Glucose reference range applies only to samples taken after fasting for at least 8 hours.   BUN 37 (H) 8 - 23 mg/dL   Creatinine, Ser 6.211.27 (H) 0.44 - 1.00 mg/dL   Calcium 8.0 (L) 8.9 - 10.3 mg/dL   GFR, Estimated 41 (L) >60 mL/min    Comment: (NOTE) Calculated using the CKD-EPI Creatinine Equation (2021)    Anion gap 11 5 - 15    Comment: Performed at [facility], [address] [place], [place] [address]    DG Shoulder Right  Result Date: 09/21/2022 CLINICAL DATA:  Fall, shoulder pain EXAM: RIGHT SHOULDER - 2+ VIEW COMPARISON:  None Available. FINDINGS: Degenerative changes in the right shoulder. Joint space narrowing and spurring in the [facility]. Spurring at the rotator cuff insertion on the greater tuberosity. No acute bony abnormality. Specifically, no fracture, subluxation, or dislocation. Patchy airspace opacity in the right upper lobe as seen on prior chest x-ray. IMPRESSION: Degenerative changes.  No acute bony abnormality. Electronically Signed   By: Charlett NoseKevin  Dover M.D.    On: 09/21/2022 00:54   DG Chest Port 1 View  Result Date: 09/20/2022 CLINICAL DATA:  Questionable sepsis EXAM: PORTABLE CHEST 1 VIEW COMPARISON:  09/12/2022 FINDINGS: Cardiomegaly. Patchy opacities in the upper lobes, right greater than left. No visible effusions. No acute bony abnormality. Old left rib fractures. IMPRESSION: Patchy opacities in the upper lobes, right greater than left. This could reflect pneumonia. Peripheral opacity in the left upper lobe is somewhat nodular. Recommend follow up to clearance. Electronically Signed   By: Charlett NoseKevin  Dover M.D.   On: 09/20/2022 22:31    Review of Systems  Unable to perform ROS: Mental status change   Blood pressure 136/69, pulse (!) 120, temperature 98 F (36.7 C), temperature source Oral, resp. rate 19, height 5\' 4"  (1.626 m), weight 58 kg, SpO2 95 %. Physical Exam Constitutional:      General: She is not in acute distress.    Appearance: She is  well-developed. She is not diaphoretic.  HENT:     Head: Normocephalic and atraumatic.  Eyes:     General: No scleral icterus.       Right eye: No discharge.        Left eye: No discharge.     Conjunctiva/sclera: Conjunctivae normal.  Cardiovascular:     Rate and Rhythm: Normal rate and regular rhythm.  Pulmonary:     Effort: Pulmonary effort is normal. No respiratory distress.  Musculoskeletal:     Cervical back: Normal range of motion.     Comments: Right shoulder, elbow, wrist, digits- no skin wounds, TTP supraclavicular area with erythema and induration, little to no pain with limited AROM, full PROM shoulder, no instability, no blocks to motion  Sens  Ax/R/M/U grossly intact  Mot   Ax/ R/ PIN/ M/ AIN/ U grossly intact  Rad 2+  Skin:    General: Skin is warm and dry.  Neurological:     Mental Status: She is alert.  Psychiatric:        Mood and Affect: Mood normal.        Behavior: Behavior normal.     Assessment/Plan: Right shoulder pain -- Given excellent ROM septic joint is very  low on differential. Will await results of CT but unless there's a large joint effusion would not advocate arthrocentesis. Suspect her pain stems from a supraclavicular cellulitis though there could certainly be abscess as well.    Freeman Caldron, PA-C Orthopedic Surgery [phone] 09/21/2022, 12:24 PM

## 2022-09-21 NOTE — ED Notes (Signed)
Called Pt niece 3 times, calls went to voice mail. Left message and number to call back.

## 2022-09-21 NOTE — Progress Notes (Signed)
PHARMACY - PHYSICIAN COMMUNICATION CRITICAL VALUE ALERT - BLOOD CULTURE IDENTIFICATION (BCID)  Madeline Jimenez is an 87 y.o. female who presented to [facility] on 09/20/2022 with a chief complaint of SOB + R-shoulder pain  Assessment:  38 YOF with recent COVID infection - admitted 11/28-11/30 - treated with molnupiravir + steroids. Now presents 12/7 with SOB and R-shoulder pain. Imaging showing R-clavicle abscess, CXR concerning for multifocal PNA now with both bottles of 1 set (only 1 set drawn) growing GPC in clusters with BCID detecting MRSA.  Name of physician (or Provider) Contacted: [place] (automatic ID consult) + FYI to [place] (FMTS)  Current antibiotics: Vancomycin + Cefepime  Changes to prescribed antibiotics recommended:  Narrow to Vancomycin monotherapy per discussion with the ID team  Results for orders placed or performed during the hospital encounter of 09/20/22  Blood Culture ID Panel (Reflexed) (Collected: 09/20/2022 10:08 PM)  Result Value Ref Range   Enterococcus faecalis NOT DETECTED NOT DETECTED   Enterococcus Faecium NOT DETECTED NOT DETECTED   Listeria monocytogenes NOT DETECTED NOT DETECTED   Staphylococcus species DETECTED (A) NOT DETECTED   Staphylococcus aureus (BCID) DETECTED (A) NOT DETECTED   Staphylococcus epidermidis NOT DETECTED NOT DETECTED   Staphylococcus lugdunensis NOT DETECTED NOT DETECTED   Streptococcus species NOT DETECTED NOT DETECTED   Streptococcus agalactiae NOT DETECTED NOT DETECTED   Streptococcus pneumoniae NOT DETECTED NOT DETECTED   Streptococcus pyogenes NOT DETECTED NOT DETECTED   A.calcoaceticus-baumannii NOT DETECTED NOT DETECTED   Bacteroides fragilis NOT DETECTED NOT DETECTED   Enterobacterales NOT DETECTED NOT DETECTED   Enterobacter cloacae complex NOT DETECTED NOT DETECTED   Escherichia coli NOT DETECTED NOT DETECTED   Klebsiella aerogenes NOT DETECTED NOT DETECTED   Klebsiella oxytoca NOT DETECTED NOT DETECTED    Klebsiella pneumoniae NOT DETECTED NOT DETECTED   Proteus species NOT DETECTED NOT DETECTED   Salmonella species NOT DETECTED NOT DETECTED   Serratia marcescens NOT DETECTED NOT DETECTED   Haemophilus influenzae NOT DETECTED NOT DETECTED   Neisseria meningitidis NOT DETECTED NOT DETECTED   Pseudomonas aeruginosa NOT DETECTED NOT DETECTED   Stenotrophomonas maltophilia NOT DETECTED NOT DETECTED   Candida albicans NOT DETECTED NOT DETECTED   Candida auris NOT DETECTED NOT DETECTED   Candida glabrata NOT DETECTED NOT DETECTED   Candida krusei NOT DETECTED NOT DETECTED   Candida parapsilosis NOT DETECTED NOT DETECTED   Candida tropicalis NOT DETECTED NOT DETECTED   Cryptococcus neoformans/gattii NOT DETECTED NOT DETECTED   Meth resistant mecA/C and MREJ DETECTED (A) NOT DETECTED    Thank you for allowing pharmacy to be a part of this patient's care.  Georgina Pillion, PharmD, BCPS Infectious Diseases Clinical Pharmacist 09/21/2022 3:04 PM   **Pharmacist phone directory can now be found on [email or web address] ([email or web address]).  Listed under [facility].

## 2022-09-21 NOTE — Progress Notes (Signed)
Orthopedic Surgery Progress Note   Assessment: Patient is a 87 y.o. female with right shoulder abscess s/p I&D, left forearm abscess s/p I&D   Plan: -Operative plans: complete -Okay for diet and dvt ppx from ortho perspective -Antibiotics: per primary -Follow up intra-operative cultures -Packing to be slowly removed each day from right shoulder, iodoform packing to be removed and replaced each day to left forearm -Weight bearing status: as tolerated -PT/OT evaluate and treat -Pain control -Dispo: per primary  ___________________________________________________________________________  Subjective: No acute events since surgery. Decision was made by anesthesia before incision to remain intubated post-op. Went to PACU intubated. Recovering in PACU intubated and sedated so no further subjective.    Physical Exam:  General: NAD, laying in bed Neuro: sedated, not following commands Respiratory breathing by vent  MSK:   -Right upper extremity  Dressing over shoulder c/d/I  Palpable radial pulse  Hand warm and well perfused  No further exam due to patient condition   -Left upper extremity  Dressing over forearm c/d/I  Palpable radial pulse  Hand warm and well perfused  No further exam due to patient condition   Patient name: Madeline Jimenez Patient MRN: [id number] Date: 09/21/22

## 2022-09-21 NOTE — Progress Notes (Signed)
Pt transported to 2M13 with no events to report.

## 2022-09-21 NOTE — Assessment & Plan Note (Deleted)
AKI intrarenal secondary to ATN from sepsis. Improving now Cr 1.15, baseline around .85-.9.  - BMP daily  - Avoid nephrotoxic agents - Encourage po hydration

## 2022-09-21 NOTE — Anesthesia Procedure Notes (Signed)
Procedure Name: Intubation Date/Time: 09/21/2022 6:12 PM  Performed by: Lorie Phenix, CRNAPre-anesthesia Checklist: Patient identified, Emergency Drugs available, Suction available and Patient being monitored Patient Re-evaluated:Patient Re-evaluated prior to induction Oxygen Delivery Method: Circle system utilized Preoxygenation: Pre-oxygenation with 100% oxygen Induction Type: IV induction Ventilation: Mask ventilation without difficulty Laryngoscope Size: Mac and 3 Grade View: Grade I Tube type: Oral Number of attempts: 1 Airway Equipment and Method: Stylet Placement Confirmation: ETT inserted through vocal cords under direct vision, positive ETCO2 and breath sounds checked- equal and bilateral Secured at: 22 cm Tube secured with: Tape Dental Injury: Teeth and Oropharynx as per pre-operative assessment

## 2022-09-21 NOTE — Assessment & Plan Note (Addendum)
S/p I&D 12/7 - Orthopedic surgery following, appreciate recs - Continue antibiotics per ID - Wet-to-dry dressing changes to R shoulder - Pain Control:   Tylenol 650mg  q6h PRN  Oxycodone 2.5mg  q6h PRN

## 2022-09-21 NOTE — Progress Notes (Signed)
Rec'd patient to bay 8 PACU placed on ventilator.  Tolerating well.

## 2022-09-21 NOTE — Assessment & Plan Note (Deleted)
Hypertension improved, but still mildly elevated. May need additional agent outpatient. Within inpatient goals.  - Continue amlodipine 5 mg

## 2022-09-21 NOTE — Assessment & Plan Note (Deleted)
EKG showed borderline prolonged QT. Chronic problem for prior hospitalization. - Avoid QT prolonging medications - Hold home SSRI - Daily EKG

## 2022-09-21 NOTE — Assessment & Plan Note (Deleted)
Leukocytosis starting to down-trend. - Doxycycline x2 weeks (End Date 12/26) - Daptomycin x6 weeks (End Date 1/22) - ID following, appreciate recs - PICC Placement

## 2022-09-21 NOTE — Progress Notes (Signed)
ANTICOAGULATION CONSULT NOTE   Pharmacy Consult for heparin Indication: pulmonary embolus  No Known Allergies  Patient Measurements: Height: 5\' 4"  (162.6 cm) Weight: 58 kg (127 lb 13.9 oz) IBW/kg (Calculated) : 54.7 Heparin Dosing Weight: 58kg  Vital Signs: Temp: 97.7 F (36.5 C) (12/07 2047) Temp Source: Axillary (12/07 2047) BP: 89/45 (12/07 2015) Pulse Rate: 62 (12/07 2015)  Labs: Recent Labs    09/20/22 2208 09/21/22 0200  HGB 12.1 10.8*  HCT 36.2 32.9*  PLT 319 254  APTT 28  --   LABPROT 18.6*  --   INR 1.6*  --   CREATININE 1.62* 1.27*  CKTOTAL 72  --     Estimated Creatinine Clearance: 28 mL/min (A) (by C-G formula based on SCr of 1.27 mg/dL (H)).   Medical History: Past Medical History:  Diagnosis Date   COPD (chronic obstructive pulmonary disease) (HCC)    HTN (hypertension)    Hyperlipidemia    Hyponatremia    Lymphocytosis    Peripheral vascular disease (HCC)    Recurrent UTI     Assessment: 87 year old female admitted with shortness of breath and shoulder pain. Patient taken to OR earlier this evening for I&D of forearm abscess. Pulmonology notes concern for cavitary pneumonia and PE. New orders to start IV heparin. Given recent surgery will omit bolus.  Hemoglobin 10.8 this am, no bleeding issues noted postop.    Goal of Therapy:  Heparin level 0.3-0.7 units/ml Monitor platelets by anticoagulation protocol: Yes   Plan:  Start heparin infusion at 800 units/hr Check anti-Xa level in 8 hours and daily while on heparin Continue to monitor H&H and platelets  83 PharmD., BCPS Clinical Pharmacist 09/21/2022 8:58 PM

## 2022-09-21 NOTE — Consult Note (Addendum)
[facility]       Reason for Consult: MRSA bacteremia    Referring Physician: CHAMP autoconsult  Principal Problem:   Sepsis (HCC) Active Problems:   Leukocytosis   Hypertension   Prolonged QT interval   Acute on chronic respiratory failure (HCC)   AKI (acute kidney injury) (HCC)   Right shoulder pain    acetaminophen  650 mg Oral Q6H   enoxaparin (LOVENOX) injection  30 mg Subcutaneous Q24H   fluticasone furoate-vilanterol  1 puff Inhalation Daily   And   umeclidinium bromide  1 puff Inhalation Daily    Recommendations: Vancomycin Will stop cefepime I and D of the abscess TTE Repeat blood cultures  Assessment: She has an abscess and MRSA bacteremia following a recent hospitalization.   Also with a significant elevation of the WBC.   HPI: Madeline Jimenez is a 87 y.o. female with a recent history of acute respiratory failure from COVID and was discharged on 11/30.  She was treated inpatient with molnupiravir and steroids and is continuing on steroids at this time.  She is on oxygen chronically with 2 L by nasal cannula with a history of COPD.  She comes in with pain in her neck and shoulder with a clavicular abscess and now MRSA bacteremia.  She is arousable but does not answer questions.     Review of Systems:  Unable to be assessed due to patient factors  Past Medical History:  Diagnosis Date   COPD (chronic obstructive pulmonary disease) (HCC)    HTN (hypertension)    Hyperlipidemia    Hyponatremia    Lymphocytosis    Peripheral vascular disease (HCC)    Recurrent UTI     Social History   Tobacco Use   Smoking status: Former    Packs/day: 2.00    Years: 50.00    Total pack years: 100.00    Types: Cigarettes   Smokeless tobacco: Never  Substance Use Topics   Alcohol use: No   Drug use: No    Family History  Problem Relation Age of Onset   Hypertension Father    Heart attack Father    Heart attack Sister      No Known Allergies  Physical Exam: Constitutional: in no apparent distress  Vitals:   09/21/22 1230 09/21/22 1241  BP: (!) 133/48   Pulse: (!) 122   Resp: (!) 27   Temp:  98.5 F (36.9 C)  SpO2: 93%    EYES: anicteric Cardiovascular: tachy Respiratory: diffuse rhonchi Musculoskeletal: no edema  Lab Results  Component Value Date   WBC 57.5 (HH) 09/21/2022   HGB 10.8 (L) 09/21/2022   HCT 32.9 (L) 09/21/2022   MCV 94.0 09/21/2022   PLT 254 09/21/2022    Lab Results  Component Value Date   CREATININE 1.27 (H) 09/21/2022   BUN 37 (H) 09/21/2022   NA 136 09/21/2022   K 3.5 09/21/2022   CL 99 09/21/2022   CO2 26 09/21/2022    Lab Results  Component Value Date   ALT 30 09/20/2022   AST 19 09/20/2022   ALKPHOS 97 09/20/2022     Microbiology: Recent Results (from the past 240 hour(s))  Blood Culture (routine x 2)     Status: None   Collection Time: 09/12/22 11:00 AM   Specimen: BLOOD  Result Value Ref Range Status   Specimen Description BLOOD RIGHT ANTECUBITAL  Final   Special Requests   Final    BOTTLES DRAWN  AEROBIC AND ANAEROBIC Blood Culture adequate volume   Culture   Final    NO GROWTH 5 DAYS Performed at [facility], [address] [place], [place] [address]    Report Status 09/17/2022 FINAL  Final  Blood Culture (routine x 2)     Status: None   Collection Time: 09/12/22 11:05 AM   Specimen: BLOOD LEFT FOREARM  Result Value Ref Range Status   Specimen Description BLOOD LEFT FOREARM  Final   Special Requests   Final    BOTTLES DRAWN AEROBIC AND ANAEROBIC Blood Culture adequate volume   Culture   Final    NO GROWTH 5 DAYS Performed at [facility], [address], [place], [place] [address]    Report Status 09/17/2022 FINAL  Final  Resp Panel by RT-PCR (Flu A&B, Covid) Anterior Nasal Swab     Status: Abnormal   Collection Time: 09/12/22 11:18 AM   Specimen: Anterior Nasal Swab  Result Value Ref Range Status   SARS Coronavirus 2 by  RT PCR POSITIVE (A) NEGATIVE Final    Comment: (NOTE) SARS-CoV-2 target nucleic acids are DETECTED.  The SARS-CoV-2 RNA is generally detectable in upper respiratory specimens during the acute phase of infection. Positive results are indicative of the presence of the identified virus, but do not rule out bacterial infection or co-infection with other pathogens not detected by the test. Clinical correlation with patient history and other diagnostic information is necessary to determine patient infection status. The expected result is Negative.  Fact Sheet for Patients: BloggerCourse.com  Fact Sheet for Healthcare Providers: SeriousBroker.it  This test is not yet approved or cleared by the [place] FDA and  has been authorized for detection and/or diagnosis of SARS-CoV-2 by FDA under an Emergency Use Authorization (EUA).  This EUA will remain in effect (meaning this test can be used) for the duration of  the COVID-19 declaration under Section 564(b)(1) of the A ct, 21 U.S.C. section 360bbb-3(b)(1), unless the authorization is terminated or revoked sooner.     Influenza A by PCR NEGATIVE NEGATIVE Final   Influenza B by PCR NEGATIVE NEGATIVE Final    Comment: (NOTE) The Xpert Xpress SARS-CoV-2/FLU/RSV plus assay is intended as an aid in the diagnosis of influenza from Nasopharyngeal swab specimens and should not be used as a sole basis for treatment. Nasal washings and aspirates are unacceptable for Xpert Xpress SARS-CoV-2/FLU/RSV testing.  Fact Sheet for Patients: BloggerCourse.com  Fact Sheet for Healthcare Providers: SeriousBroker.it  This test is not yet approved or cleared by the [place] FDA and has been authorized for detection and/or diagnosis of SARS-CoV-2 by FDA under an Emergency Use Authorization (EUA). This EUA will remain in effect (meaning this test can be  used) for the duration of the COVID-19 declaration under Section 564(b)(1) of the Act, 21 U.S.C. section 360bbb-3(b)(1), unless the authorization is terminated or revoked.  Performed at [facility], [address], [place], [place] [address]   Blood Culture (routine x 2)     Status: None (Preliminary result)   Collection Time: 09/20/22 10:08 PM   Specimen: BLOOD  Result Value Ref Range Status   Specimen Description BLOOD RIGHT ANTECUBITAL  Final   Special Requests   Final    BOTTLES DRAWN AEROBIC AND ANAEROBIC Blood Culture results may not be optimal due to an excessive volume of blood received in culture bottles   Culture  Setup Time   Final    GRAM POSITIVE COCCI  IN CLUSTERS IN BOTH AEROBIC AND ANAEROBIC BOTTLES CRITICAL RESULT CALLED TO, READ BACK BY AND VERIFIED WITH: Lennie Hummer [id number] AT 1452 BY CM Performed at [facility], [address], [place], [place] [address]    Culture GRAM POSITIVE COCCI  Final   Report Status PENDING  Incomplete  Blood Culture ID Panel (Reflexed)     Status: Abnormal   Collection Time: 09/20/22 10:08 PM  Result Value Ref Range Status   Enterococcus faecalis NOT DETECTED NOT DETECTED Final   Enterococcus Faecium NOT DETECTED NOT DETECTED Final   Listeria monocytogenes NOT DETECTED NOT DETECTED Final   Staphylococcus species DETECTED (A) NOT DETECTED Final    Comment: CRITICAL RESULT CALLED TO, READ BACK BY AND VERIFIED WITH: PHARMD E MARTIN [id number] AT 1452 BY CM    Staphylococcus aureus (BCID) DETECTED (A) NOT DETECTED Final    Comment: Methicillin (oxacillin)-resistant Staphylococcus aureus (MRSA). MRSA is predictably resistant to beta-lactam antibiotics (except ceftaroline). Preferred therapy is vancomycin unless clinically contraindicated. Patient requires contact precautions if  hospitalized. CRITICAL RESULT CALLED TO, READ BACK BY AND VERIFIED WITH: PHARMD E MARTIN [id number] AT 1452 BY CM    Staphylococcus epidermidis NOT  DETECTED NOT DETECTED Final   Staphylococcus lugdunensis NOT DETECTED NOT DETECTED Final   Streptococcus species NOT DETECTED NOT DETECTED Final   Streptococcus agalactiae NOT DETECTED NOT DETECTED Final   Streptococcus pneumoniae NOT DETECTED NOT DETECTED Final   Streptococcus pyogenes NOT DETECTED NOT DETECTED Final   A.calcoaceticus-baumannii NOT DETECTED NOT DETECTED Final   Bacteroides fragilis NOT DETECTED NOT DETECTED Final   Enterobacterales NOT DETECTED NOT DETECTED Final   Enterobacter cloacae complex NOT DETECTED NOT DETECTED Final   Escherichia coli NOT DETECTED NOT DETECTED Final   Klebsiella aerogenes NOT DETECTED NOT DETECTED Final   Klebsiella oxytoca NOT DETECTED NOT DETECTED Final   Klebsiella pneumoniae NOT DETECTED NOT DETECTED Final   Proteus species NOT DETECTED NOT DETECTED Final   Salmonella species NOT DETECTED NOT DETECTED Final   Serratia marcescens NOT DETECTED NOT DETECTED Final   Haemophilus influenzae NOT DETECTED NOT DETECTED Final   Neisseria meningitidis NOT DETECTED NOT DETECTED Final   Pseudomonas aeruginosa NOT DETECTED NOT DETECTED Final   Stenotrophomonas maltophilia NOT DETECTED NOT DETECTED Final   Candida albicans NOT DETECTED NOT DETECTED Final   Candida auris NOT DETECTED NOT DETECTED Final   Candida glabrata NOT DETECTED NOT DETECTED Final   Candida krusei NOT DETECTED NOT DETECTED Final   Candida parapsilosis NOT DETECTED NOT DETECTED Final   Candida tropicalis NOT DETECTED NOT DETECTED Final   Cryptococcus neoformans/gattii NOT DETECTED NOT DETECTED Final   Meth resistant mecA/C and MREJ DETECTED (A) NOT DETECTED Final    Comment: CRITICAL RESULT CALLED TO, READ BACK BY AND VERIFIED WITH: PHARMD E MARTIN [id number] AT 1452 BY CM Performed at [facility], [address], [place], [place] [address]     Gardiner Barefoot, MD [facility] [email or web address] 09/21/2022, 3:18 PM

## 2022-09-21 NOTE — ED Notes (Signed)
Medication requested from pharmacy at this time 

## 2022-09-21 NOTE — Progress Notes (Signed)
Patient arrived from PACU on propofol drip at , Elink notified of patients arrival and infusions running, verbal given to continue patient on current infusion. Patient with oral medications due but no OG, OG tube placed and verified. Patient with hypotension, Elink notified, vasopressors started in PIV while awaiting US guided IV placement by IV team. Ground team came to bedside, orders given to switch propofol to fentanyl drip.

## 2022-09-21 NOTE — Assessment & Plan Note (Deleted)
Patient with leukocytosis 66 on admission. Now 57.5. Meets SIRS criteria, but afebrile. Possibly increased due to infection and steroid use; however, higher than expected in current context. Patient does have a nodular lung opacity on CXR.  - daily CBC  - f/u peripheral smear

## 2022-09-21 NOTE — Progress Notes (Signed)
eLink Physician-Brief Progress Note Patient Name: Madeline Jimenez DOB: 30-Jul-1935 MRN: [id number]   Date of Service  09/21/2022  HPI/Events of Note  Multiple issues: 1. Nursing request for order for OGT which has already been placed. 2. Nursing request to review abdominal film for OGT placement - OGT tip and side port in satisfactory position overlaying the proximal stomach. 3. Hypotension - BP 76/50 with MAP = 59. HR = 66. No central venous line or CVP.  eICU Interventions  Plan: OGT to LIS. OK to use OGT. Norepinephrine IV infusion via PIV. Titrate to MAP >= 65.      Intervention Category Major Interventions: Other:;Hypotension - evaluation and management  [name] Dennard Nip 09/21/2022, 10:16 PM

## 2022-09-21 NOTE — Consult Note (Addendum)
NAME:  Madeline Jimenez, MRN:  [id number], DOB:  May 03, 1935, LOS: 0 ADMISSION DATE:  09/20/2022, CONSULTATION DATE: 12.7 REFERRING MD:  [email or web address] , CHIEF COMPLAINT:  abnormal CT chest    History of Present Illness:  87 year old female with COPD on 2L at home, HTN, HLD, PVD with chief complaint of right shoulder pain and shortness of breath admitted for AHRF and sepsis secondary to pneumonia +/- cellulitis. She had just been discharged from the hospital on 11/30 after being admitted with acute on chronic respiratory failure felt secondary to COVID pneumonia she was started on antiviral therapy and Decadron.  She had reported having ongoing right shoulder pain since the time of discharge. In the ED he was found to be hypotensive and tachycardic cultures were sentshe was started on IV antibiotics for community-acquired pneumonia, but also started on IV vancomycin for concern about cellulitis involving the right shoulder. Cultures positive for MRSA bacteremia from clavicular abscess with incidental PE as well as lung consolidation with areas of cavitation found on CT. Ortho consulted and patient underwent I&D. PCCM consulted for abnormal CT findings. Additional consultation services included infectious disease, and orthopedics, who brought the patient to the operating room for debridement of her right upper extremity cellulitis  She was taken to OR evening of 12/7 and had I&D of left forearm and right shoulder abscesses. Post operatively, she remained on the ventilator and PCCM was called in consultation.  Pertinent  Medical History  COVID just discharged 11/30 COPD HTN HL Hyponatremia  PVD Significant Hospital Events: Including procedures, antibiotic start and stop dates in addition to other pertinent events   12/7 admitted w/ SOB and right shoulder and arm pain. MRSA bacteremia + ID consulted. Ortho consulted for I&D of right shoulder and forarm abscess Pulm consulted for cavitary PNA and PE w/  primary service also wondering about malignancy. 12/7 to OR for I&D of left forearm and right shoulder abscesses  Interim History / Subjective:  Sedated post op. Getting US guided IV by IV team.  Objective   Blood pressure (!) 89/45, pulse 62, temperature 97.7 F (36.5 C), temperature source Axillary, resp. rate 15, height 5\' 4"  (1.626 m), weight 58 kg, SpO2 97 %.    Vent Mode: PRVC FiO2 (%):  [70 %-100 %] 70 % Set Rate:  [15 bmp] 15 bmp Vt Set:  [400 mL] 400 mL PEEP:  [5 cmH20] 5 cmH20 Plateau Pressure:  [17 cmH20] 17 cmH20   Intake/Output Summary (Last 24 hours) at 09/21/2022 2253 Last data filed at 09/21/2022 2015 Gross per 24 hour  Intake 1700 ml  Output 750 ml  Net 950 ml   Filed Weights   09/20/22 2146  Weight: 58 kg    Examination: General: Elderly female, sedated, in NAD. Neuro: Sedated, not responsive. HEENT: [place]/AT. Sclerae anicteric. ETT in place. Cardiovascular: RRR, no M/R/G.  Lungs: Respirations even and unlabored.  CTA bilaterally, No W/R/R. Abdomen: BS x 4, soft, NT/ND.  Musculoskeletal: L forearm and R shoulder dressings C/D/I. No edema.  Skin: Intact, warm, no rashes.    Assessment & Plan:   Post operative vent management. Acute on chronic respiratory failure (on 2L O2) secondary to MRSA multilobar cavitary pneumonia, superimposed on COPD & further complicated by segmental pulmonary embolus - CT imaging reviewed with bilateral mass-like consolidations with cavitation seen in LUL mass and maybe in LLL mass. Suspect this may be septic emboli. Low on differential but cannot rule out malignancy. - Full vent support. - Wean in  AM, plan to extubate if able. - Bronchial hygiene. - BD's, changed to nebs while intubated. - Continue heparin infusion with plans to transition to DOAC. - F/u on echo. - Add LE duplex. - At this point would treat as infectious with repeat CT imaging in 4 to 6 weeks before considering possibility of malignancy. Also prefer to  postpone for at least 1-2 months of anticoagulation before holding for intervention  Shock - multifactorial 2/2 sepsis in setting of L forearm and R shoulder abscesses with MRSA bacteremia (s/p I&D by ortho 12/7) and also likely sedation related. - Continue low dose Levophed as needed to maintain goal MAP > 65. - D/c Propofol, switch to Fentanyl. - Continue fluids. - Post op care per ortho. - Continue Vanc, follow cultures. - ID also on board, appreciate the assistance. - F/u on TTE.  AKI. - Fluids. - Follow BMP.  Recent COVID PNA (09/12/22) - diagnosed during last admission 11/28 - 11/30, she received Molnupiravir and Decadron. - Supportive care.  Best Practice (right click and "Reselect all SmartList Selections" daily)  Diet: NPO VTE ppx: Heparin gtt. GI ppx: PPI. Code status: Full.  Labs   CBC: Recent Labs  Lab 09/20/22 2208 09/21/22 0200  WBC 66.9* 57.5*  NEUTROABS 42.8* 41.4*  HGB 12.1 10.8*  HCT 36.2 32.9*  MCV 91.6 94.0  PLT 319 254    Basic Metabolic Panel: Recent Labs  Lab 09/20/22 2208 09/21/22 0200  NA 136 136  K 3.8 3.5  CL 92* 99  CO2 31 26  GLUCOSE 275* 193*  BUN 44* 37*  CREATININE 1.62* 1.27*  CALCIUM 9.5 8.0*  MG  --  1.5*   GFR: Estimated Creatinine Clearance: 28 mL/min (A) (by C-G formula based on SCr of 1.27 mg/dL (H)). Recent Labs  Lab 09/20/22 2208 09/21/22 0200  WBC 66.9* 57.5*  LATICACIDVEN 2.8* 1.6    Liver Function Tests: Recent Labs  Lab 09/20/22 2208  AST 19  ALT 30  ALKPHOS 97  BILITOT 0.5  PROT 6.5  ALBUMIN 2.8*   No results for input(s): "LIPASE", "AMYLASE" in the last 168 hours. No results for input(s): "AMMONIA" in the last 168 hours.  ABG    Component Value Date/Time   HCO3 26.9 09/12/2022 1434   TCO2 28 09/12/2022 1434   O2SAT 97 09/12/2022 1434     Coagulation Profile: Recent Labs  Lab 09/20/22 2208  INR 1.6*    Cardiac Enzymes: Recent Labs  Lab 09/20/22 2208  CKTOTAL 72     HbA1C: No results found for: "HGBA1C"  CBG: Recent Labs  Lab 09/21/22 1755 09/21/22 2044  GLUCAP 141* 146*    Review of Systems:   Unable to obtain as pt is encephalopathic.  Past Medical History:  She,  has a past medical history of COPD (chronic obstructive pulmonary disease) (HCC), HTN (hypertension), Hyperlipidemia, Hyponatremia, Lymphocytosis, Peripheral vascular disease (HCC), and Recurrent UTI.   Surgical History:   Past Surgical History:  Procedure Laterality Date   ABDOMINAL HYSTERECTOMY     for fibroid   TONSILECTOMY, ADENOIDECTOMY, BILATERAL MYRINGOTOMY AND TUBES       Social History:   reports that she has quit smoking. She has a 100.00 pack-year smoking history. She has never used smokeless tobacco. She reports that she does not drink alcohol and does not use drugs.   Family History:  Her family history includes Heart attack in her father and sister; Hypertension in her father.   Allergies No Known Allergies  Home Medications  Prior to Admission medications   Medication Sig Start Date End Date Taking? Authorizing Provider  amLODipine (NORVASC) 5 MG tablet Take 5 mg by mouth daily. 03/06/22   [provider]  dexamethasone (DECADRON) 6 MG tablet Take 1 tablet (6 mg total) by mouth daily for 8 days. 09/14/22 09/22/22  Glendale Chard, DO  escitalopram (LEXAPRO) 10 MG tablet Take 10 mg by mouth daily. 01/26/22   [provider]  Multiple Vitamins-Minerals (MULTIVITAMIN WITH MINERALS) tablet Take 1 tablet by mouth daily with breakfast.    [provider]  thiamine 100 MG tablet Take 1 tablet (100 mg total) by mouth daily. Patient not taking: Reported on 09/12/2022 03/11/22   Meredeth Ide, MD  traZODone (DESYREL) 50 MG tablet Take 50 mg by mouth at bedtime. 12/14/21   [provider]  TRELEGY ELLIPTA 100-62.5-25 MCG/ACT AEPB Inhale 1 puff into the lungs See admin instructions. Inhale 1 puff into the lungs at 8 AM and 5 PM 02/13/22    [provider]     Critical care time: 40 min.    Rutherford Guys, PA - [facility] For pager details, please see AMION or use Epic chat  After 1900, please call [facility] for cross coverage needs 09/21/2022, 10:53 PM

## 2022-09-21 NOTE — ED Notes (Signed)
RT called to bedside due to patient's work of breathing and wet crackly lung sounds throughout

## 2022-09-21 NOTE — Consult Note (Addendum)
Orthopedic Surgery Consult Note  Assessment: Patient is a 87 y.o. female with left forearm abscess and right shoulder abscess   Plan: -Operative plans: I&D tonight -Diet: NPO for procedure -DVT ppx: per primary -Antibiotics: per primary -Will obtain specimens tonight -Weight bearing status: as tolerated -PT/OT evaluate and treat -Pain control -Dispo: pending completion of operative plans   Discussed recommendation for operative intervention in the form of I&D of the right shoulder and left forearm. Explained the risks of this procedure included, but were not limited to: bleeding, persistent infection, pain, wound dehiscence, need for additional procedures, deep vein thrombosis, pulmonary embolism, and death. The benefits of this procedure would be drain the abscess and obtain specimens. The alternatives of this surgery would be to treat with antibiotic or do nothing. The patient's niece (POA) questions were answered to her satisfaction. After this discussion, she elected to proceed with surgery for the patient. Informed consent was obtained.   ___________________________________________________________________________   Consult reason: rule out septic shoulder  History:  Patient is a 87 y.o. female who is currently admitted to the medicine service for shortness of breath, right shoulder pain, and concern for sepsis. She is confused this evening and is not able to provide further history. She answers yes/no but not at appropriate times and then goes back to sleep.   Review of systems: Unable to obtain due to patient condition  Past medical history (per chart review):  COPD HTN HLD PVD Recurrent UTI  Allergies (per chart review): NKDA   Past surgical history (per chart review):  Hysterectomy Tonsillectomy and adenoidectomy  Social history (per chart review): No use of nicotine-containing products (cigarettes, vaping, smokeless, etc.) - former Alcohol use: none Denies use  of recreational drugs  Physical Exam:  General: NAD, laying in bed Neurologic: sleeping but awakes to voice, no answering questions appropriately, not following commands Respiratory: unlabored breathing  MSK:   -Right upper extremity  TTP over the right shoulder, there is swelling and erythema over the clavicle, no pain with passive range of motion through the shoulder/elbow/wrist Seen firing deltoid and biceps, did not witness other motion  Responds to light touch in intact to light touch in median/ulnar/radial/axillary nerve distributions  Hand warm and well perfused  Left upper extremity  No tenderness to palpation over extremity, except over the rigth midshaft radius. There is swelling in that area and purulent drainage. There is a small wound at that site. There is a palpable fluid collection. No other fluid collection seen over the extremity Fires deltoid, biceps, triceps, wrist extensors, wrist flexors, finger extensors, finger flexors  AIN/PIN/IO intact  Palpable radial pulse  Responds to light touch in intact to light touch in median/ulnar/radial/axillary nerve distributions  Hand warm and well perfused  -Bilateral lower extremities  No tenderness to palpation over extremity, no gross deformity, no pain through range of motion at the hip/knee/ankle Fires hamstrings, tibialis anterior, gastrocnemius and soleus, no other motion witnessed Responds to light touch in sural, saphenous, tibial, deep peroneal, and superficial peroneal nerve distributions Foot warm and well perfused  Imaging: CT of the right shoulder shows swelling and fluid collection over the right clavicle    Patient name: Madeline Jimenez Patient MRN: [id number] Date: 09/21/22

## 2022-09-21 NOTE — Progress Notes (Signed)
Pharmacy Antibiotic Note  Madeline Jimenez is a 87 y.o. female admitted on 09/20/2022 with SOB likely secondary to superimprosed bacterial pneumonia given recent COVID infection and CXR finding of bilateral infiltrates.  Pharmacy has been consulted for vancomycin dosing. sCr 1.62 up from 0.93 on (09/14/22)  Plan: Vancomycin 1250 mg IV once Check random vancomycin level ~12-24 hours after first dose  Height: 5\' 4"  (162.6 cm) Weight: 58 kg (127 lb 13.9 oz) IBW/kg (Calculated) : 54.7  Temp (24hrs), Avg:98.8 F (37.1 C), Min:98.8 F (37.1 C), Max:98.8 F (37.1 C)  Recent Labs  Lab 09/14/22 1018 09/20/22 2208  WBC 24.2* 66.9*  CREATININE 0.93 1.62*  LATICACIDVEN  --  2.8*    Estimated Creatinine Clearance: 21.9 mL/min (A) (by C-G formula based on SCr of 1.62 mg/dL (H)).    No Known Allergies  Antimicrobials this admission: Vancomycin 12/7>>  Microbiology results: pending  Thank you for allowing pharmacy to be a part of this patient's care.  2209 Madeline Jimenez 09/21/2022 1:41 AM

## 2022-09-21 NOTE — Progress Notes (Addendum)
Daily Progress Note Intern Pager: [phone]  Patient name: Madeline Jimenez Medical record number: [id number] Date of birth: January 13, 1935 Age: 87 y.o. Gender: female  Primary Care Provider: Evie Lacks, NP (Inactive) Consultants: None Code Status: DNR  Pt Overview and Major Events to Date:  12/6 - Admitted   Assessment and Plan: Madeline Jimenez is a 87 y.o. female who presented with shortness of breath and R shoulder pain who was recently admitted for COVID found to have pneumonia and in Sepsis possibly complicated by UTI and cellulitis. PMHX includes COPD, HTN and HLD.   * Sepsis [facility]) Patient still meets SIRS criteria with tachycardia, tachypnea, leukocytosis. Most likely from pneumonia as CXR shows pneumonia. Likely superimposed bacterial pneumonia given recent COVID infection requiring hospitalization. However, other possible sources include cellulitis from left forearm, septic arthritis from right shoulder, possible UTI given dysuria and leukocytosis.  - Start broad spectrum abx vanc, cefepime  - UA/UC pending - Continue maintenance IVF  - Blood culture pending - CT neck and shoulder  - Consult orthopedic surgery  - Consider synovial fluid aspiration and analysis  - Continue to monitor vitals   Acute on chronic respiratory failure (HCC) Now requiring 4L when patient typically uses 2L O2 at home due to h/o COPD. Likely secondary to superimposed bacterial pneumonia. Copious mucus on exam. - Wean oxygen as tolerated with O2 sat goal 88-92% - Suctioning for mucus clearance - Will be covered for pneumonia by broad spectrum abx - Continue home inhalers Breo and Incruse - Hold on steroids due to other infections  - Consider CT PE if resp status worsens   AKI (acute kidney injury) (HCC) Cr 1.6 on admission (baseline ~0.9). Now Cr 1.27. Improving. Likely prerenal secondary to dehydration. - Continue IVF  - Avoid nephrotoxic agents - Strict I&Os - Continue to monitor as  patient is on Vanc and receiving contrast with CT - BMP tomorrow   Prolonged QT interval EKG showed borderline prolonged QT. Chronic problem for prior hospitalization. - Avoid QT prolonging medications - Hold home SSRI - Daily EKG  Hypertension Normotensive, most likely in the setting of sepsis.  - Hold home med Amlodipine - Restart as appropriate   Right shoulder pain Started during prior hospitalization. Shoulder XR shows degenerative changes without acute fracture. Now red, warm, and very painful, unable to  move at all. Possible site of infection  - Consider synovial fluid aspiration and analysis  - Pain management with Tylenol 650mg  q6h scheduled  - PT/OT  Leukocytosis Patient with leukocytosis 66 on admission. Now 57.5. Meets SIRS criteria, but afebrile. Possibly increased due to infection and steroid use; however, higher than expected in current context. Patient does have a nodular lung opacity on CXR.  - daily CBC  - f/u peripheral smear    FEN/GI: Regular diet, maintenance IVF  PPx: lovenox  Dispo:Remain admitted for sepsis, plan for DC to home   Subjective:  Patient says that it is difficult to move as right shoulder and neck are very painful. She is also having shortness of breath and feels unwell.   Objective: Temp:  [98 F (36.7 C)-99.1 F (37.3 C)] 98 F (36.7 C) (12/07 0830) Pulse Rate:  [120-136] 120 (12/07 0830) Resp:  [19-29] 19 (12/07 0830) BP: (123-144)/(55-87) 136/69 (12/07 0830) SpO2:  [93 %-99 %] 95 % (12/07 0830) Weight:  [58 kg] 58 kg (12/06 2146) Physical Exam: General: Ill appearing, very uncomfortable  Cardiovascular: RRR, cap refill = 3 seconds Respiratory: Dyspnea on 5L,  inspiratory crackles, few rhonci Abdomen: soft, non tender, non distended  Extremities: Left forearm with redness and edema, central eschar, right shoulder and neck red with edema just over acromion   Laboratory: Most recent CBC Lab Results  Component Value Date    WBC 57.5 (HH) 09/21/2022   HGB 10.8 (L) 09/21/2022   HCT 32.9 (L) 09/21/2022   MCV 94.0 09/21/2022   PLT 254 09/21/2022   Most recent BMP    Latest Ref Rng & Units 09/21/2022    2:00 AM  BMP  Glucose 70 - 99 mg/dL 086   BUN 8 - 23 mg/dL 37   Creatinine 5.78 - 1.00 mg/dL 4.69   Sodium 629 - 528 mmol/L 136   Potassium 3.5 - 5.1 mmol/L 3.5   Chloride 98 - 111 mmol/L 99   CO2 22 - 32 mmol/L 26   Calcium 8.9 - 10.3 mg/dL 8.0     CXR Radiologist Impression:  My interpretation: right upper lung consolidation and left mid lung consolidation, more nodular   Lockie Mola, MD 09/21/2022, 10:45 AM  PGY-1,  Family Medicine FPTS Intern pager: [phone], text pages welcome Secure chat group [facility] Teaching Service

## 2022-09-21 NOTE — Brief Op Note (Signed)
09/21/2022  7:07 PM  PATIENT:  Madeline Jimenez  87 y.o. female  PRE-OPERATIVE DIAGNOSIS:  abscess of shoulder  POST-OPERATIVE DIAGNOSIS:  abscess of shoulder  PROCEDURE:  Procedure(s): IRRIGATION AND DEBRIDEMENT OF SHOULDER ABSCESS (Right) IRRIGATION AND DEBRIDEMENT LEFT FORARM (Left)  SURGEON:  Surgeon(s) and Role:    London Sheer, MD - Primary  PHYSICIAN ASSISTANT:   ASSISTANTS: none   ANESTHESIA:   general  EBL:  30cc   BLOOD ADMINISTERED:none  DRAINS: none   LOCAL MEDICATIONS USED:  NONE  SPECIMEN:  Source of Specimen:  right shoulder and left forearm  DISPOSITION OF SPECIMEN:  N/A  COUNTS:  YES  TOURNIQUET: NONE  DICTATION: .Note written in EPIC  PLAN OF CARE: Admit to inpatient   PATIENT DISPOSITION:  ICU - intubated and hemodynamically stable.   Delay start of Pharmacological VTE agent (>24hrs) due to surgical blood loss or risk of bleeding: no

## 2022-09-22 ENCOUNTER — Encounter (HOSPITAL_COMMUNITY): Payer: Self-pay | Admitting: Orthopedic Surgery

## 2022-09-22 ENCOUNTER — Inpatient Hospital Stay (HOSPITAL_COMMUNITY): Payer: Medicare Other

## 2022-09-22 DIAGNOSIS — R6521 Severe sepsis with septic shock: Secondary | ICD-10-CM

## 2022-09-22 DIAGNOSIS — A419 Sepsis, unspecified organism: Secondary | ICD-10-CM

## 2022-09-22 DIAGNOSIS — I2699 Other pulmonary embolism without acute cor pulmonale: Secondary | ICD-10-CM

## 2022-09-22 LAB — CBC WITH DIFFERENTIAL/PLATELET
Abs Immature Granulocytes: 0 10*3/uL (ref 0.00–0.07)
Basophils Absolute: 0 10*3/uL (ref 0.0–0.1)
Basophils Relative: 0 %
Eosinophils Absolute: 0 10*3/uL (ref 0.0–0.5)
Eosinophils Relative: 0 %
HCT: 31.6 % — ABNORMAL LOW (ref 36.0–46.0)
Hemoglobin: 9.9 g/dL — ABNORMAL LOW (ref 12.0–15.0)
Lymphocytes Relative: 39 %
Lymphs Abs: 20.7 10*3/uL — ABNORMAL HIGH (ref 0.7–4.0)
MCH: 29.3 pg (ref 26.0–34.0)
MCHC: 31.3 g/dL (ref 30.0–36.0)
MCV: 93.5 fL (ref 80.0–100.0)
Monocytes Absolute: 2.1 10*3/uL — ABNORMAL HIGH (ref 0.1–1.0)
Monocytes Relative: 4 %
Neutro Abs: 30.3 10*3/uL — ABNORMAL HIGH (ref 1.7–7.7)
Neutrophils Relative %: 57 %
Platelets: 256 10*3/uL (ref 150–400)
RBC: 3.38 MIL/uL — ABNORMAL LOW (ref 3.87–5.11)
RDW: 14.7 % (ref 11.5–15.5)
WBC: 53.1 10*3/uL (ref 4.0–10.5)
nRBC: 0 % (ref 0.0–0.2)
nRBC: 0 /100 WBC

## 2022-09-22 LAB — HEPARIN LEVEL (UNFRACTIONATED)
Heparin Unfractionated: <0.1 IU/mL — ABNORMAL LOW (ref 0.30–0.70)
Heparin Unfractionated: <0.1 IU/mL — ABNORMAL LOW (ref 0.30–0.70)

## 2022-09-22 LAB — POCT I-STAT 7, (LYTES, BLD GAS, ICA,H+H)
Acid-Base Excess: 3 mmol/L — ABNORMAL HIGH (ref 0.0–2.0)
Bicarbonate: 28.4 mmol/L — ABNORMAL HIGH (ref 20.0–28.0)
Calcium, Ion: 1.05 mmol/L — ABNORMAL LOW (ref 1.15–1.40)
HCT: 39 % (ref 36.0–46.0)
Hemoglobin: 13.3 g/dL (ref 12.0–15.0)
O2 Saturation: 99 %
Patient temperature: 98.3
Potassium: 3.7 mmol/L (ref 3.5–5.1)
Sodium: 139 mmol/L (ref 135–145)
TCO2: 30 mmol/L (ref 22–32)
pCO2 arterial: 45 mmHg (ref 32–48)
pH, Arterial: 7.407 (ref 7.35–7.45)
pO2, Arterial: 142 mmHg — ABNORMAL HIGH (ref 83–108)

## 2022-09-22 LAB — GLUCOSE, CAPILLARY
Glucose-Capillary: 138 mg/dL — ABNORMAL HIGH (ref 70–99)
Glucose-Capillary: 142 mg/dL — ABNORMAL HIGH (ref 70–99)
Glucose-Capillary: 147 mg/dL — ABNORMAL HIGH (ref 70–99)
Glucose-Capillary: 157 mg/dL — ABNORMAL HIGH (ref 70–99)
Glucose-Capillary: 175 mg/dL — ABNORMAL HIGH (ref 70–99)

## 2022-09-22 LAB — BASIC METABOLIC PANEL
Anion gap: 10 (ref 5–15)
BUN: 37 mg/dL — ABNORMAL HIGH (ref 8–23)
CO2: 28 mmol/L (ref 22–32)
Calcium: 8 mg/dL — ABNORMAL LOW (ref 8.9–10.3)
Chloride: 102 mmol/L (ref 98–111)
Creatinine, Ser: 1.49 mg/dL — ABNORMAL HIGH (ref 0.44–1.00)
GFR, Estimated: 34 mL/min — ABNORMAL LOW (ref >60)
Glucose, Bld: 142 mg/dL — ABNORMAL HIGH (ref 70–99)
Potassium: 3.9 mmol/L (ref 3.5–5.1)
Sodium: 140 mmol/L (ref 135–145)

## 2022-09-22 LAB — MAGNESIUM
Magnesium: 2.2 mg/dL (ref 1.7–2.4)
Magnesium: 2.3 mg/dL (ref 1.7–2.4)

## 2022-09-22 LAB — PHOSPHORUS: Phosphorus: 3.9 mg/dL (ref 2.5–4.6)

## 2022-09-22 LAB — ECHOCARDIOGRAM COMPLETE
Area-P 1/2: 2.44 cm2
Calc EF: 45.5 %
Height: 64 in
Single Plane A2C EF: 35.6 %
Single Plane A4C EF: 55.8 %
Weight: 2045.87 oz

## 2022-09-22 LAB — BRAIN NATRIURETIC PEPTIDE: B Natriuretic Peptide: 138.3 pg/mL — ABNORMAL HIGH (ref 0.0–100.0)

## 2022-09-22 LAB — MRSA NEXT GEN BY PCR, NASAL: MRSA by PCR Next Gen: DETECTED — AB

## 2022-09-22 LAB — PROCALCITONIN: Procalcitonin: 12.05 ng/mL

## 2022-09-22 LAB — LACTIC ACID, PLASMA: Lactic Acid, Venous: 1.2 mmol/L (ref 0.5–1.9)

## 2022-09-22 LAB — TROPONIN I (HIGH SENSITIVITY): Troponin I (High Sensitivity): 30 ng/L — ABNORMAL HIGH (ref <18)

## 2022-09-22 MED ORDER — IPRATROPIUM BROMIDE 0.02 % IN SOLN
0.5000 mg | Freq: Two times a day (BID) | RESPIRATORY_TRACT | Status: DC
  Administered 2022-09-22 – 2022-09-29 (×14): 0.5 mg via RESPIRATORY_TRACT
  Filled 2022-09-22 (×14): qty 2.5

## 2022-09-22 MED ORDER — FENTANYL CITRATE PF 50 MCG/ML IJ SOSY
25.0000 ug | PREFILLED_SYRINGE | INTRAMUSCULAR | Status: DC | PRN
  Administered 2022-09-23: 50 ug via INTRAVENOUS
  Filled 2022-09-22: qty 1

## 2022-09-22 MED ORDER — OSMOLITE 1.2 CAL PO LIQD
1000.0000 mL | ORAL | Status: DC
  Administered 2022-09-22: 1000 mL
  Filled 2022-09-22 (×4): qty 1000

## 2022-09-22 MED ORDER — LACTATED RINGERS IV BOLUS
1000.0000 mL | Freq: Once | INTRAVENOUS | Status: AC
  Administered 2022-09-22: 1000 mL via INTRAVENOUS

## 2022-09-22 MED ORDER — FENTANYL CITRATE PF 50 MCG/ML IJ SOSY
PREFILLED_SYRINGE | INTRAMUSCULAR | Status: AC
  Filled 2022-09-22: qty 2

## 2022-09-22 MED ORDER — PERFLUTREN LIPID MICROSPHERE
1.0000 mL | INTRAVENOUS | Status: AC | PRN
  Administered 2022-09-22: 4 mL via INTRAVENOUS

## 2022-09-22 MED ORDER — MIDAZOLAM HCL 2 MG/2ML IJ SOLN
1.0000 mg | INTRAMUSCULAR | Status: DC | PRN
  Administered 2022-09-22: 2 mg via INTRAVENOUS
  Administered 2022-09-23 (×3): 1 mg via INTRAVENOUS
  Filled 2022-09-22 (×5): qty 2

## 2022-09-22 MED ORDER — THIAMINE MONONITRATE 100 MG PO TABS
100.0000 mg | ORAL_TABLET | Freq: Every day | ORAL | Status: DC
  Administered 2022-09-22 – 2022-09-23 (×2): 100 mg
  Filled 2022-09-22 (×3): qty 1

## 2022-09-22 NOTE — Progress Notes (Incomplete)
{  Select Note:[id number]} 

## 2022-09-22 NOTE — Progress Notes (Signed)
Orthopedic Surgery Progress Note   Assessment: Patient is a 87 y.o. female with right shoulder abscess s/p I&D, left forearm abscess s/p I&D   Plan: -Operative plans: complete -Okay for diet and dvt ppx from ortho perspective -Antibiotics: per primary -Iodoform removal and replacement to left forearm daily, gradual iodoform removal from right shoulder daily -Weight bearing status: as tolerated -Follow up intra-op cultures (GPCs) -PT/OT evaluate and treat -Pain control -Dispo: per primary  ___________________________________________________________________________  Subjective: No acute events overnight. Remained intubated and in ICU last night. Sedation weaned this morning. Still intubated so no further subjective.   Physical Exam:  General: no acute distress, appears stated age Neurologic: intermittently following commands, nods head Respiratory: breathing by vent  MSK:   -Right upper extremity  Dressing with small amount of blood on it Fires deltoid, biceps, triceps, wrist extensors, wrist flexors, finger extensors, finger flexors  Sensation intact to light touch in median/ulnar/radial/axillary nerve distributions  Hand warm and well perfused, palpable radial pulse  -Left upper extremity  Dressing c/d/i Fires deltoid, biceps, triceps, wrist extensors, wrist flexors, finger extensors, finger flexors  Sensation intact to light touch in median/ulnar/radial/axillary nerve distributions  Hand warm and well perfused, palpable radial pulse  Patient name: Madeline Jimenez Patient MRN: [id number] Date: 09/22/22

## 2022-09-22 NOTE — Progress Notes (Signed)
  Echocardiogram 2D Echocardiogram has been performed.  Maren Reamer 09/22/2022, 10:59 AM

## 2022-09-22 NOTE — Progress Notes (Signed)
ANTICOAGULATION CONSULT NOTE  Pharmacy Consult for heparin Indication: pulmonary embolus  No Known Allergies  Patient Measurements: Height: 5\' 4"  (162.6 cm) Weight: 58 kg (127 lb 13.9 oz) IBW/kg (Calculated) : 54.7 Heparin Dosing Weight: 58kg  Vital Signs: Temp: 99.4 F (37.4 C) (12/08 1100) Temp Source: Oral (12/08 1100) BP: 144/91 (12/08 1900) Pulse Rate: 118 (12/08 1900)  Labs: Recent Labs    09/20/22 2208 09/21/22 0200 09/22/22 0303 09/22/22 0312 09/22/22 0701 09/22/22 1014 09/22/22 1849  HGB 12.1 10.8* 9.9* 13.3  --   --   --   HCT 36.2 32.9* 31.6* 39.0  --   --   --   PLT 319 254 256  --   --   --   --   APTT 28  --   --   --   --   --   --   LABPROT 18.6*  --   --   --   --   --   --   INR 1.6*  --   --   --   --   --   --   HEPARINUNFRC  --   --   --   --  <0.10*  --  <0.10*  CREATININE 1.62* 1.27* 1.49*  --   --   --   --   CKTOTAL 72  --   --   --   --   --   --   TROPONINIHS  --   --   --   --   --  30*  --      Estimated Creatinine Clearance: 23.8 mL/min (A) (by C-G formula based on SCr of 1.49 mg/dL (H)).   Assessment: 87 year old female admitted with SOB and shoulder pain. Patient taken to OR 12/7 for I&D of forearm abscess. Pulmonology notes concern for cavitary pneumonia and PE, and Pharmacy consulted for IV heparin dosing.   Heparin level undetectable again. No issue with infusion or with abscess site per discussion with RN; no bleeding reported. Pt remains intubated. Will continue to avoid giving bolus dose with recent forearm I&D.   Goal of Therapy:  Heparin level 0.3-0.7 units/ml Monitor platelets by anticoagulation protocol: Yes   Plan:  Increase IV heparin to 1200 units/hr Daily heparin level and CBC Monitor H&H F/u plans to transition to DOAC    14/7, PharmD PGY1 Pharmacy Resident   09/22/2022 7:49 PM

## 2022-09-22 NOTE — Progress Notes (Addendum)
[facility]   Reason for visit: Follow up on bacteremia  Interval History: now intubated in the ICU; s/p operative debridement of the abscess.   Day 3 antibiotics  Physical Exam: Constitutional:  Vitals:   09/22/22 0938 09/22/22 0951  BP:    Pulse:    Resp:    Temp:    SpO2: 92% 93%  sedated Respiratory: respiratory effort on vent  Review of Systems: Unable to be assessed due to patient factors  Lab Results  Component Value Date   WBC 53.1 (HH) 09/22/2022   HGB 13.3 09/22/2022   HCT 39.0 09/22/2022   MCV 93.5 09/22/2022   PLT 256 09/22/2022    Lab Results  Component Value Date   CREATININE 1.49 (H) 09/22/2022   BUN 37 (H) 09/22/2022   NA 139 09/22/2022   K 3.7 09/22/2022   CL 102 09/22/2022   CO2 28 09/22/2022    Lab Results  Component Value Date   ALT 30 09/20/2022   AST 19 09/20/2022   ALKPHOS 97 09/20/2022     Microbiology: Recent Results (from the past 240 hour(s))  Resp Panel by RT-PCR (Flu A&B, Covid) Anterior Nasal Swab     Status: Abnormal   Collection Time: 09/12/22 11:18 AM   Specimen: Anterior Nasal Swab  Result Value Ref Range Status   SARS Coronavirus 2 by RT PCR POSITIVE (A) NEGATIVE Final    Comment: (NOTE) SARS-CoV-2 target nucleic acids are DETECTED.  The SARS-CoV-2 RNA is generally detectable in upper respiratory specimens during the acute phase of infection. Positive results are indicative of the presence of the identified virus, but do not rule out bacterial infection or co-infection with other pathogens not detected by the test. Clinical correlation with patient history and other diagnostic information is necessary to determine patient infection status. The expected result is Negative.  Fact Sheet for Patients: BloggerCourse.com  Fact Sheet for Healthcare Providers: SeriousBroker.it  This test is not yet approved or cleared by the [place]  FDA and  has been authorized for detection and/or diagnosis of SARS-CoV-2 by FDA under an Emergency Use Authorization (EUA).  This EUA will remain in effect (meaning this test can be used) for the duration of  the COVID-19 declaration under Section 564(b)(1) of the A ct, 21 U.S.C. section 360bbb-3(b)(1), unless the authorization is terminated or revoked sooner.     Influenza A by PCR NEGATIVE NEGATIVE Final   Influenza B by PCR NEGATIVE NEGATIVE Final    Comment: (NOTE) The Xpert Xpress SARS-CoV-2/FLU/RSV plus assay is intended as an aid in the diagnosis of influenza from Nasopharyngeal swab specimens and should not be used as a sole basis for treatment. Nasal washings and aspirates are unacceptable for Xpert Xpress SARS-CoV-2/FLU/RSV testing.  Fact Sheet for Patients: BloggerCourse.com  Fact Sheet for Healthcare Providers: SeriousBroker.it  This test is not yet approved or cleared by the [place] FDA and has been authorized for detection and/or diagnosis of SARS-CoV-2 by FDA under an Emergency Use Authorization (EUA). This EUA will remain in effect (meaning this test can be used) for the duration of the COVID-19 declaration under Section 564(b)(1) of the Act, 21 U.S.C. section 360bbb-3(b)(1), unless the authorization is terminated or revoked.  Performed at [facility], [address] [place], [place] [address]   Blood Culture (routine x 2)     Status: None (Preliminary result)   Collection Time: 09/20/22 10:08 PM   Specimen: BLOOD  Result Value Ref  Range Status   Specimen Description BLOOD RIGHT ANTECUBITAL  Final   Special Requests   Final    BOTTLES DRAWN AEROBIC AND ANAEROBIC Blood Culture results may not be optimal due to an excessive volume of blood received in culture bottles   Culture  Setup Time   Final    GRAM POSITIVE COCCI IN CLUSTERS IN BOTH AEROBIC AND ANAEROBIC BOTTLES CRITICAL RESULT CALLED TO,  READ BACK BY AND VERIFIED WITH: Lennie Hummer [id number] AT 1452 BY CM Performed at [facility], [address] [place], [place] [address]    Culture GRAM POSITIVE COCCI  Final   Report Status PENDING  Incomplete  Blood Culture ID Panel (Reflexed)     Status: Abnormal   Collection Time: 09/20/22 10:08 PM  Result Value Ref Range Status   Enterococcus faecalis NOT DETECTED NOT DETECTED Final   Enterococcus Faecium NOT DETECTED NOT DETECTED Final   Listeria monocytogenes NOT DETECTED NOT DETECTED Final   Staphylococcus species DETECTED (A) NOT DETECTED Final    Comment: CRITICAL RESULT CALLED TO, READ BACK BY AND VERIFIED WITH: PHARMD E MARTIN [id number] AT 1452 BY CM    Staphylococcus aureus (BCID) DETECTED (A) NOT DETECTED Final    Comment: Methicillin (oxacillin)-resistant Staphylococcus aureus (MRSA). MRSA is predictably resistant to beta-lactam antibiotics (except ceftaroline). Preferred therapy is vancomycin unless clinically contraindicated. Patient requires contact precautions if  hospitalized. CRITICAL RESULT CALLED TO, READ BACK BY AND VERIFIED WITH: PHARMD E MARTIN [id number] AT 1452 BY CM    Staphylococcus epidermidis NOT DETECTED NOT DETECTED Final   Staphylococcus lugdunensis NOT DETECTED NOT DETECTED Final   Streptococcus species NOT DETECTED NOT DETECTED Final   Streptococcus agalactiae NOT DETECTED NOT DETECTED Final   Streptococcus pneumoniae NOT DETECTED NOT DETECTED Final   Streptococcus pyogenes NOT DETECTED NOT DETECTED Final   A.calcoaceticus-baumannii NOT DETECTED NOT DETECTED Final   Bacteroides fragilis NOT DETECTED NOT DETECTED Final   Enterobacterales NOT DETECTED NOT DETECTED Final   Enterobacter cloacae complex NOT DETECTED NOT DETECTED Final   Escherichia coli NOT DETECTED NOT DETECTED Final   Klebsiella aerogenes NOT DETECTED NOT DETECTED Final   Klebsiella oxytoca NOT DETECTED NOT DETECTED Final   Klebsiella pneumoniae NOT DETECTED NOT DETECTED Final    Proteus species NOT DETECTED NOT DETECTED Final   Salmonella species NOT DETECTED NOT DETECTED Final   Serratia marcescens NOT DETECTED NOT DETECTED Final   Haemophilus influenzae NOT DETECTED NOT DETECTED Final   Neisseria meningitidis NOT DETECTED NOT DETECTED Final   Pseudomonas aeruginosa NOT DETECTED NOT DETECTED Final   Stenotrophomonas maltophilia NOT DETECTED NOT DETECTED Final   Candida albicans NOT DETECTED NOT DETECTED Final   Candida auris NOT DETECTED NOT DETECTED Final   Candida glabrata NOT DETECTED NOT DETECTED Final   Candida krusei NOT DETECTED NOT DETECTED Final   Candida parapsilosis NOT DETECTED NOT DETECTED Final   Candida tropicalis NOT DETECTED NOT DETECTED Final   Cryptococcus neoformans/gattii NOT DETECTED NOT DETECTED Final   Meth resistant mecA/C and MREJ DETECTED (A) NOT DETECTED Final    Comment: CRITICAL RESULT CALLED TO, READ BACK BY AND VERIFIED WITH: PHARMD E MARTIN [id number] AT 1452 BY CM Performed at [facility], [address] [place], [place] [address]   Surgical pcr screen     Status: Abnormal   Collection Time: 09/21/22  4:57 PM   Specimen: Nasal Mucosa; Nasal Swab  Result Value Ref Range Status   MRSA, PCR POSITIVE (A) NEGATIVE Final  Comment: EMAILED LINDSI FORTE ON 09/21/22 @ 2004 BY DRT   Staphylococcus aureus POSITIVE (A) NEGATIVE Final    Comment: (NOTE) The Xpert SA Assay (FDA approved for NASAL specimens in patients 85 years of age and older), is one component of a comprehensive surveillance program. It is not intended to diagnose infection nor to guide or monitor treatment. Performed at [facility], [address] [place], [place] [address]   Aerobic/Anaerobic Culture w Gram Stain (surgical/deep wound)     Status: None (Preliminary result)   Collection Time: 09/21/22  6:27 PM   Specimen: PATH Other; Tissue  Result Value Ref Range Status   Specimen Description WOUND LEFT ARM  Final   Special Requests LEFT FOREARM PT  ON ANCEF  Final   Gram Stain   Final    RARE WBC PRESENT, PREDOMINANTLY MONONUCLEAR RARE GRAM POSITIVE COCCI IN SINGLES    Culture   Final    TOO YOUNG TO READ Performed at [facility], [address] [place], [place] [address]    Report Status PENDING  Incomplete  Aerobic/Anaerobic Culture w Gram Stain (surgical/deep wound)     Status: None (Preliminary result)   Collection Time: 09/21/22  6:28 PM   Specimen: PATH Other; Tissue  Result Value Ref Range Status   Specimen Description TISSUE LEFT ARM  Final   Special Requests LEFT FOREARM PT ON ANCEF SAMPLE  Final   Gram Stain   Final    RARE WBC PRESENT, PREDOMINANTLY MONONUCLEAR NO ORGANISMS SEEN    Culture   Final    NO GROWTH < 12 HOURS Performed at [facility], [address] [place], [place] [address]    Report Status PENDING  Incomplete  Aerobic/Anaerobic Culture w Gram Stain (surgical/deep wound)     Status: None (Preliminary result)   Collection Time: 09/21/22  6:28 PM   Specimen: PATH Other; Tissue  Result Value Ref Range Status   Specimen Description WOUND RIGHT SHOULDER  Final   Special Requests SWAB PT ON ANCEF  Final   Gram Stain   Final    RARE WBC PRESENT, PREDOMINANTLY MONONUCLEAR FEW GRAM POSITIVE COCCI IN CLUSTERS    Culture   Final    TOO YOUNG TO READ Performed at [facility], [address] [place], [place] [address]    Report Status PENDING  Incomplete  Aerobic/Anaerobic Culture w Gram Stain (surgical/deep wound)     Status: None (Preliminary result)   Collection Time: 09/21/22  6:44 PM   Specimen: PATH Other; Tissue  Result Value Ref Range Status   Specimen Description TISSUE RIGHT SHOULDER NO 1  Final   Special Requests PT ON ANCEF  Final   Gram Stain   Final    RARE WBC PRESENT, PREDOMINANTLY MONONUCLEAR FEW GRAM POSITIVE COCCI IN CLUSTERS    Culture   Final    TOO YOUNG TO READ Performed at [facility], [address] [place], [place] [address]    Report  Status PENDING  Incomplete  Aerobic/Anaerobic Culture w Gram Stain (surgical/deep wound)     Status: None (Preliminary result)   Collection Time: 09/21/22  6:44 PM   Specimen: PATH Other; Tissue  Result Value Ref Range Status   Specimen Description TISSUE RIGHT SHOULDER  Final   Special Requests SAMPLE NO 2 PT ON ANCEF  Final   Gram Stain   Final    RARE WBC PRESENT,BOTH PMN AND MONONUCLEAR FEW GRAM POSITIVE COCCI  IN CLUSTERS    Culture   Final    NO GROWTH < 12 HOURS Performed at [facility], [address], [place], [place] [address]    Report Status PENDING  Incomplete  MRSA Next Gen by PCR, Nasal     Status: Abnormal   Collection Time: 09/21/22  8:42 PM   Specimen: Nasal Mucosa; Nasal Swab  Result Value Ref Range Status   MRSA by PCR Next Gen DETECTED (A) NOT DETECTED Final    Comment: RESULT CALLED TO, READ BACK BY AND VERIFIED WITH:  E HUDSON,RN@0109  09/22/22 MK (NOTE) The GeneXpert MRSA Assay (FDA approved for NASAL specimens only), is one component of a comprehensive MRSA colonization surveillance program. It is not intended to diagnose MRSA infection nor to guide or monitor treatment for MRSA infections. Test performance is not FDA approved in patients less than 53 years old. Performed at [facility], [address], [place], [place] [address]     Impression/Plan:  1. MRSA bacteremia - positive growth in 2/2 bottles.  On vancomycin.  TTE ordered.   Will repeat blood cultures tomorrow Continue with vancomycin  2.  Respiratory failure - now intubated and CXR with bilateral upper lobe consolidative opacities and other bilateral areas of consolidation with areas of cavitation.  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   TTE pending.   3.  COVID positive - known recent infection.  Remains on isolation.    4.  Leukocytosis - significantly elevated.  Unclear etiology and not typically c/w infection.  May be developing some  hematologic issue.  She will need further evaluation if it persists.    Dr. Daiva Eves will monitor remotely over the weekend, otherwise we will follow up on Monday.

## 2022-09-22 NOTE — Progress Notes (Signed)
ANTICOAGULATION CONSULT NOTE  Pharmacy Consult for heparin Indication: pulmonary embolus  No Known Allergies  Patient Measurements: Height: 5\' 4"  (162.6 cm) Weight: 58 kg (127 lb 13.9 oz) IBW/kg (Calculated) : 54.7 Heparin Dosing Weight: 58kg  Vital Signs: Temp: 98.2 F (36.8 C) (12/08 0730) Temp Source: Oral (12/08 0730) BP: 104/54 (12/08 0800) Pulse Rate: 68 (12/08 0800)  Labs: Recent Labs    09/20/22 2208 09/21/22 0200 09/22/22 0303 09/22/22 0312 09/22/22 0701  HGB 12.1 10.8* 9.9* 13.3  --   HCT 36.2 32.9* 31.6* 39.0  --   PLT 319 254 256  --   --   APTT 28  --   --   --   --   LABPROT 18.6*  --   --   --   --   INR 1.6*  --   --   --   --   HEPARINUNFRC  --   --   --   --  <0.10*  CREATININE 1.62* 1.27* 1.49*  --   --   CKTOTAL 72  --   --   --   --      Estimated Creatinine Clearance: 23.8 mL/min (A) (by C-G formula based on SCr of 1.49 mg/dL (H)).   Assessment: 87 year old female admitted with SOB and shoulder pain. Patient taken to OR 12/7 for I&D of forearm abscess. Pulmonology notes concern for cavitary pneumonia and PE, and Pharmacy consulted for IV heparin dosing.   Heparin level undetectable.  No issue with infusion per discussion with RN; no bleeding reported.  Goal of Therapy:  Heparin level 0.3-0.7 units/ml Monitor platelets by anticoagulation protocol: Yes   Plan:  Increase IV heparin to 1000 units/hr Check 8 hr heparin level Daily heparin level and CBC  Madeline Jimenez D. 14/7, PharmD, BCPS, BCCCP 09/22/2022, 8:50 AM

## 2022-09-22 NOTE — Progress Notes (Signed)
D/w RN  Patient is off pressors Was  on PSV and got apenic with fent prn -> now on PRVC  Plan  - no PSV till tomorrow AM  - do fent prn -> stretch out dosing  - continue versed prn - dc levophed from [facility]     SIGNATURE    Dr. Kalman Shan, M.D., F.C.C.P,  Pulmonary and Critical Care Medicine Staff Physician, [facility] Director - Interstitial Lung Disease  Program  Medical Director - [facility] Pulmonary Fibrosis [facility] at [facility] [place], [place], [address]   Pager: [phone], If no answer  -> Check AMION or Try [phone] Telephone (clinical office): [phone] Telephone (research): [phone]  6:01 PM 09/22/2022

## 2022-09-22 NOTE — Progress Notes (Signed)
BLE venous duplex has been completed.   Results can be found under chart review under CV PROC. 09/22/2022 10:27 AM Coti Burd RVT, RDMS

## 2022-09-22 NOTE — Progress Notes (Addendum)
Initial Nutrition Assessment  DOCUMENTATION CODES:  Severe malnutrition in context of chronic illness  INTERVENTION:  Initiate tube feeding via OGT: Osmolite 1.2 at 60 ml/h (1440 ml per day) Start at 70mL/h and advance by 70mL q8h to goal of 87mL/h Provides 1728 kcal, 80 gm protein, 1181 ml free water daily Pt at risk for refeeding, monitor electrolytes and replace as needed Thiamine 100mg  x 5 days  NUTRITION DIAGNOSIS:  Severe Malnutrition related to chronic illness (COPD) as evidenced by severe muscle depletion, severe fat depletion.  GOAL:  Patient will meet greater than or equal to 90% of their needs  MONITOR:  Vent status, Labs, TF tolerance, I & O's  REASON FOR ASSESSMENT:  Ventilator    ASSESSMENT:  Pt with hx of COPD, HTN, HLD, PVD and CKD presented to ED with SOB and shoulder pain after recent admission and COVID19 dx.   Patient is currently intubated on ventilator support. No family at bedside at the time of assessment. SBT this AM, but unable to extubate due to secretions. Discussed with MD, ok to start EN feeds. OGT in place. Significant muscle and fat deficits noted indicative of long term poor nutrition. At risk for refeeding.   MV: 8.7 L/min Temp (24hrs), Avg:98.2 F (36.8 C), Min:97.7 F (36.5 C), Max:99.4 F (37.4 C)   Intake/Output Summary (Last 24 hours) at 09/22/2022 1517 Last data filed at 09/22/2022 1100 Gross per 24 hour  Intake 1467.74 ml  Output 1435 ml  Net 32.74 ml  Net IO Since Admission: 1,682.74 mL [09/22/22 1517]  Nutritionally Relevant Medications: Scheduled Meds:  docusate  100 mg Per Tube BID   mouth rinse  15 mL Mouth Rinse Q2H   pantoprazole (PROTONIX) IV  40 mg Intravenous Daily   polyethylene glycol  17 g Per Tube Daily   Continuous Infusions:  sodium chloride 100 mL/hr at 09/21/22 0234   vancomycin     Labs Reviewed: BUN 37, creatinine 1.49 CBG ranges from 142-157 mg/dL over the last 24 hours  NUTRITION - FOCUSED  PHYSICAL EXAM: Flowsheet Row Most Recent Value  Orbital Region Severe depletion  Upper Arm Region Severe depletion  Thoracic and Lumbar Region Moderate depletion  Buccal Region Severe depletion  Temple Region Severe depletion  Clavicle Bone Region Mild depletion  Clavicle and Acromion Bone Region Severe depletion  Scapular Bone Region Unable to assess  Dorsal Hand Unable to assess  [mittens]  Patellar Region Severe depletion  Anterior Thigh Region Severe depletion  Posterior Calf Region Severe depletion  Edema (RD Assessment) None  Hair Reviewed  Eyes Reviewed  Mouth Unable to assess  Skin Reviewed  Nails Unable to assess    Diet Order:   Diet Order             Diet NPO time specified  Diet effective now                   EDUCATION NEEDS:  Not appropriate for education at this time  Skin:  Skin Assessment: Reviewed RN Assessment  Last BM:  prior to admission  Height:  Ht Readings from Last 1 Encounters:  09/20/22 5\' 4"  (1.626 m)    Weight:  Wt Readings from Last 1 Encounters:  09/20/22 58 kg    Ideal Body Weight:  54.5 kg  BMI:  Body mass index is 21.95 kg/m.  Estimated Nutritional Needs:  Kcal:  1500-1700 kcal/d Protein:  75-90g/d Fluid:  >/=1.5L/d    , RD, LDN Clinical Dietitian RD  pager # available in [place]  After hours/weekend pager # available in [facility]

## 2022-09-22 NOTE — Consult Note (Signed)
NAME:  Madeline Jimenez, MRN:  [id number], DOB:  1935-02-05, LOS: 1 ADMISSION DATE:  09/20/2022, CONSULTATION DATE: 12.7 REFERRING MD:  mcintyre , CHIEF COMPLAINT:  abnormal CT chest    History of Present Illness:  87 year old female with COPD on 2L at home, HTN, HLD, PVD with chief complaint of right shoulder pain and shortness of breath admitted for AHRF and sepsis secondary to pneumonia +/- cellulitis. She had just been discharged from the hospital on 11/30 after being admitted with acute on chronic respiratory failure felt secondary to COVID pneumonia she was started on antiviral therapy and Decadron.  She had reported having ongoing right shoulder pain since the time of discharge. In the ED he was found to be hypotensive and tachycardic cultures were sentshe was started on IV antibiotics for community-acquired pneumonia, but also started on IV vancomycin for concern about cellulitis involving the right shoulder. Cultures positive for MRSA bacteremia from clavicular abscess with incidental PE as well as lung consolidation with areas of cavitation found on CT. Ortho consulted and patient underwent I&D. PCCM consulted for abnormal CT findings. Additional consultation services included infectious disease, and orthopedics, who brought the patient to the operating room for debridement of her right upper extremity cellulitis  She was taken to OR evening of 12/7 and had I&D of left forearm and right shoulder abscesses. Post operatively, she remained on the ventilator and PCCM was called in consultation.  Pertinent  Medical History  COVID just discharged 11/30 COPD HTN HL Hyponatremia  PVD Significant Hospital Events: Including procedures, antibiotic start and stop dates in addition to other pertinent events   12/7 admitted w/ SOB and right shoulder and arm pain. MRSA bacteremia + ID consulted. Ortho consulted for I&D of right shoulder and forarm abscess Pulm consulted for cavitary PNA and PE w/  primary service also wondering about malignancy. Rt shoulder culture - few GPC in coulsure Left arm wound - rarte GPC MRSA PCR - POSITIVe 12/7 to OR for I&D of left forearm and right shoulder abscesses - Sedated post op. Getting US guided IV by IV team.  Interim History / Subjective:   12/8 - on vent 50% fio2, afebrile, wbc 53k and better. On abx. Positive balance +1.7L. On fent gtt, levophd gtt and heparin gtt (for PE). Folowing commands on fent gtt   Objective   Blood pressure (!) 104/54, pulse 68, temperature 98.2 F (36.8 C), temperature source Oral, resp. rate 15, height  (1.626 m), weight 58 kg, SpO2 95 %.    Vent Mode: PRVC FiO2 (%):  [50 %-100 %] 50 % Set Rate:  [15 bmp] 15 bmp Vt Set:  [400 mL-430 mL] 430 mL PEEP:  [5 cmH20] 5 cmH20 Plateau Pressure:  [7 cmH20-17 cmH20] 16 cmH20   Intake/Output Summary (Last 24 hours) at 09/22/2022 0831 Last data filed at 09/22/2022 0800 Gross per 24 hour  Intake 1342.21 ml  Output 1260 ml  Net 82.21 ml   Filed Weights   09/20/22 2146  Weight: 58 kg    General Appearance:  Looks frail and critically ill Head:  Normocephalic, without obvious abnormality, atraumatic Eyes:  PERRL - yes, conjunctiva/corneas - muddy     Ears:  Normal external ear canals, both ears Nose:  G tube - no Throat:  ETT TUBE - yes , OG tube - yes Neck:  Supple,  No enlargement/tenderness/nodules Lungs: Clear to auscultation bilaterally, Ventilator   Synchrony - yes at 50% Heart:  S1 and S2 normal, no  murmur, CVP - no.  Pressors - levophed + Abdomen:  Soft, no masses, no organomegaly Genitalia / Rectal:  Not done Extremities:  Extremities- intact Skin:  ntact in exposed areas . Sacral area - not examined Neurologic:  Sedation - fent gtt -> RASS - -2 . Moves all 4s - yesx. CAM-ICU - cannot test . Orientation - nodded approrpately       Assessment & Plan:   Hx of COPD Acute on chronic respiratory failure (on 2L O2) secondary to MRSA multilobar  cavitary pneumonia, superimposed on COPD & further complicated by segmental pulmonary embolus - CT imaging reviewed with bilateral mass-like consolidations with cavitation seen in LUL mass and maybe in LLL mass. Suspect this may be septic emboli. Low on differential but cannot rule out malignancy.   09/22/2022 - > does YES meet criteria for SBT of Acute Respiratory Failure due to septic embolii and PE  Plan  - SBT after sedation off  -> assess for extubation - Full vent support. - Bronchial hygiene. - BD's, changed to nebs while intubated. - needs followp CT   Segmental PE  12/8 - tolerating heparin. No bleed  Plan - Continue heparin infusion with plans to transition to DOAC. - F/u on echo. - Await  LE duplex. -check trop, bnp, lactate   Shock - multifactorial 2/2 sepsis in setting of L forearm and R shoulder abscesses with MRSA bacteremia (s/p I&D by ortho 12/7) and also likely sedation related.  Doubt PE playing a role  12/8 - on low dose levophed  PLAN - Continue low dose Levophed as needed to maintain goal MAP > 65. - check bnp, trop, lactic acid, Procal and echo -> if shock from PE then discuss EKOS v embolectomy (does not seem good candidate for either)  SEdation needs on ventilator  12/8 - no delirium. Followign commands on fent gtt at 75mcg/min  Plan  - dc fent gtt - do fent prn - do versed prn - RASS goal 0 to -2  Recent COVID PNA (09/12/22) - diagnosed during last admission 11/28 - 11/30, s/p Molnupiravir and Decadron. MRSA absesccs and sepsis  Plan  - Per OID   AKI.  12/8 - improved and worse  Plan - Fluids. - Follow BMP.     Best Practice (right click and "Reselect all SmartList Selections" daily)  Diet: NPO VTE ppx: Heparin gtt. GI ppx: PPI. Code status: DNR but full medical care  Family :  12/8 - called neiece Darl Pikes Reberg [phone] -> LMTCB   ATTESTATION & SIGNATURE   The patient Madeline Jimenez is critically ill with multiple  organ systems failure and requires high complexity decision making for assessment and support, frequent evaluation and titration of therapies, application of advanced monitoring technologies and extensive interpretation of multiple databases and discussion with other appropriate health care personnel such as bedside nurses, social workers, case Production designer, theatre/television/film, consultants, respiratory therapists, nutritionists, secretaries etc.,  Critical care time includes but is not restricted to just documentation time. Documentation can happen in parallel or sequential to care time depending on case mix urgency and priorities for the shift. So, overall critical Care Time devoted to patient care services described in this note is  45  Minutes.   This time reflects time of care of this signee Dr Kalman Shan which includ does not reflect procedure time, or teaching time or supervisory time of PA/NP/Med student/Med Resident etc but could involve care discussion time     Dr. Kalman Shan, M.D.,  F.C.C.P Pulmonary and Critical Care Medicine Medical Director - [facility] ICU Staff Physician, [facility] [place] [facility] Pager: [phone], If no answer or between  15:00h - 7:00h: call [phone]  09/22/2022 9:18 AM    LABS    PULMONARY Recent Labs  Lab 09/22/22 0312  PHART 7.407  PCO2ART 45.0  PO2ART 142*  HCO3 28.4*  TCO2 30  O2SAT 99    CBC Recent Labs  Lab 09/20/22 2208 09/21/22 0200 09/22/22 0303 09/22/22 0312  HGB 12.1 10.8* 9.9* 13.3  HCT 36.2 32.9* 31.6* 39.0  WBC 66.9* 57.5* 53.1*  --   PLT 319 254 256  --     COAGULATION Recent Labs  Lab 09/20/22 2208  INR 1.6*    CARDIAC  No results for input(s): "TROPONINI" in the last 168 hours. No results for input(s): "PROBNP" in the last 168 hours.   CHEMISTRY Recent Labs  Lab 09/20/22 2208 09/21/22 0200 09/22/22 0303 09/22/22 0312  NA 136 136 140 139  K 3.8 3.5 3.9 3.7  CL 92* 99  102  --   CO2 --   GLUCOSE 275* 193* 142*  --   BUN 44* 37* 37*  --   CREATININE 1.62* 1.27* 1.49*  --   CALCIUM 9.5 8.0* 8.0*  --   MG  --  1.5* 2.2  --    Estimated Creatinine Clearance: 23.8 mL/min (A) (by C-G formula based on SCr of 1.49 mg/dL (H)).   LIVER Recent Labs  Lab 09/20/22 2208  AST 19  ALT 30  ALKPHOS 97  BILITOT 0.5  PROT 6.5  ALBUMIN 2.8*  INR 1.6*     INFECTIOUS Recent Labs  Lab 09/20/22 2208 09/21/22 0200  LATICACIDVEN 2.8* 1.6     ENDOCRINE CBG (last 3)  Recent Labs    09/21/22 1755 09/21/22 2044 09/22/22 0004  GLUCAP 141* 146* 157*         IMAGING x48h  - image(s) personally visualized  -   highlighted in bold DG Abd 1 View  Result Date: 09/21/2022 CLINICAL DATA:  OG tube placement EXAM: ABDOMEN - 1 VIEW COMPARISON:  CT 09/21/2022 FINDINGS: Cardiomegaly. Esophageal tube tip overlies the proximal stomach. Contrast within the renal collecting systems IMPRESSION: Esophageal tube tip overlies the proximal stomach. Electronically Signed   By: Jasmine Pang M.D.   On: 09/21/2022 21:50   DG Forearm Left  Result Date: 09/21/2022 CLINICAL DATA:  Pain EXAM: LEFT FOREARM - 2 VIEW COMPARISON:  None Available. FINDINGS: There is no evidence of fracture or other focal bone lesions. Soft tissues are unremarkable. IMPRESSION: No fracture or dislocation of the left radius or ulna. Electronically Signed   By: Jearld Lesch M.D.   On: 09/21/2022 20:40   CT Angio Chest Pulmonary Embolism (PE) W or WO Contrast  Result Date: 09/21/2022 CLINICAL DATA:  An 87 year old female presents with history of shortness of breath. EXAM: CT ANGIOGRAPHY CHEST WITH CONTRAST TECHNIQUE: Multidetector CT imaging of the chest was performed using the standard protocol during bolus administration of intravenous contrast. Multiplanar CT image reconstructions and MIPs were obtained to evaluate the vascular anatomy. RADIATION DOSE REDUCTION: This exam was performed  according to the departmental dose-optimization program which includes automated exposure control, adjustment of the mA and/or kV according to patient size and/or use of iterative reconstruction technique. CONTRAST:  75mL OMNIPAQUE IOHEXOL 350 MG/ML SOLN COMPARISON:  None available aside from shoulder imaging  of the same date. FINDINGS: Cardiovascular: Calcified and noncalcified aortic atherosclerotic plaque in the thoracic aorta. No aneurysmal dilation. Small pericardial effusion. Normal heart size. Straightening of the interventricular septum. Main pulmonary artery is well opacified centrally with the density of 565 Hounsfield units. Emboli in segmental level branches to posterior RIGHT upper lobe, also in posterior RIGHT lower lobe branches at the segmental and subsegmental level. No additional signs of pulmonary embolism. No lobar level or central pulmonary embolism. Mediastinum/Nodes: Mildly patulous esophagus. No thoracic inlet, axillary or hilar adenopathy. No signs of mediastinal adenopathy though there are top-normal size lymph nodes throughout the pre-vascular space and in the AP window largest approximately 10 mm (image 55/5) also precarinal lymph node 11 mm (image 52/5) 11 mm. (Image 72/5) 11 mm LEFT hilar lymph node. Thickening of the esophagus with loss of discrete lumen in the mid esophagus (image 61/5) significance uncertain. Not substantially dilated above this level. Lungs/Pleura: Bilateral areas of consolidation and septal thickening. Masslike consolidation in the RIGHT upper lobe (image 32/6) 5.3 x 2.2 cm. Peripheral masslike consolidation in the RIGHT upper lobe (image 50/6) 4.5 x 5.6 cm. Scattered pulmonary nodules in the RIGHT chest, largest discrete nodule 10 mm (image 59/6) this is in the medial RIGHT lower lobe. Cavitary appearing area with lucency along the margin of the pleural surface associated with does are peripheral septal thickening and consolidative changes along the periphery of  the LEFT upper lobe measuring 7.4 x 3.5 cm. Similar appearing area in the LEFT lower lobe superior segment (image 78/5) 3.0 x 1.8 cm with scattered smaller foci of nodularity throughout the LEFT chest. Background pulmonary emphysema. No pleural effusion or sign of consolidative changes. Upper Abdomen: Limited assessment of upper abdominal contents. Imaged portions of liver, spleen, and adrenal glands are unremarkable. Musculoskeletal: Age indeterminate compression fracture which is seen in the lower thoracic spine not grossly changed since previous radiograph from September 12, 2022 no more remote comparison is are available. Spinal degenerative changes. Some degenerative changes appear to surround the compression fracture at the thoracolumbar junction. Stranding about the RIGHT shoulder in this patient with suspected abscess with cellulitis and myositis. Review of the MIP images confirms the above findings. IMPRESSION: 1. Segmental level pulmonary emboli in the posterior RIGHT upper lobe. 2. Straightening of the interventricular septum is of uncertain significance but may be indicative of elevated RIGHT heart pressures. Correlation with echocardiography could be considered as warranted. 3. Bilateral areas of consolidation and septal thickening with areas of cavitation. While findings could reflect multifocal pneumonia with areas of necrosis the appearance does raise the question of multifocal bronchogenic pneumonic neoplasm and in addition there are scattered bilateral pulmonary nodules which may be indicative of metastases. Consider pulmonology consultation for further evaluation. Ultimately short interval follow-up, PET or biopsy may be warranted depending on clinical presentation. 4. Thickening of the esophagus with loss of discrete lumen in the mid esophagus. This areas not well assessed. Correlate with any symptoms of dysphagia. Given the relatively focal appearance of this area consider dedicated esophageal  evaluation on a nonemergent basis. This would be best done outside of the acute setting. 5. Top-normal lymph nodes throughout the chest, again reactive versus is neoplastic. 6. Age indeterminate compression fracture at the thoracolumbar junction not grossly changed since previous radiograph from September 12, 2022 no more remote comparison is are available. This is favored to be chronic based on appearance but would correlate with any signs of back pain in this area. 7. Aortic atherosclerosis. 8. RIGHT  shoulder findings better illustrated on the dedicated shoulder evaluation. 9. Emphysema. Aortic Atherosclerosis (ICD10-I70.0) and Emphysema (ICD10-J43.9). Critical Value/emergent results were called by telephone at the time of interpretation on 09/21/2022 at 4:34 pm to provider Dr. Julieanne Mansonegler, Who verbally acknowledged these results. A Electronically Signed   By: Donzetta KohutGeoffrey  Wile M.D.   On: 09/21/2022 16:34   CT SOFT TISSUE NECK W CONTRAST  Result Date: 09/21/2022 CLINICAL DATA:  Soft tissue infection suspected. EXAM: CT NECK WITH CONTRAST TECHNIQUE: Multidetector CT imaging of the neck was performed using the standard protocol following the bolus administration of intravenous contrast. RADIATION DOSE REDUCTION: This exam was performed according to the departmental dose-optimization program which includes automated exposure control, adjustment of the mA and/or kV according to patient size and/or use of iterative reconstruction technique. CONTRAST:  75mL OMNIPAQUE IOHEXOL 350 MG/ML SOLN COMPARISON:  Chest radiograph 09/20/2022 FINDINGS: Pharynx and larynx: Bilateral tonsils are normal in appearance. The epiglottis is normal in appearance. No evidence of odontogenic soft tissue abscess. No evidence of retropharyngeal edema. Bilateral aryepiglottic folds are normal in appearance. Salivary glands: No inflammation, mass, or stone. Thyroid: Normal. Lymph nodes: None enlarged or abnormal density. Vascular: Negative. Limited  intracranial: Negative. Visualized orbits: Bilateral lens replacement. Mastoids and visualized paranasal sinuses: Mucosal thickening bilateral maxillary sinuses. No mastoid effusion. Skeleton: No acute or aggressive process. Upper chest: There are hypoenhancing consolidative opacities in the bilateral upper lobes as well as the right lung apex. There is likely an acute PE in a right upper lobe segmental pulmonary artery (series 3, image 135). There are prominent mediastinal lymph nodes which are nonspecific, and possibly reactive. There is focal soft tissue thickening and a possible fluid collection superior to the right AC joint. Please see separately dictated CT of the shoulder for characterization of findings are in the right glenohumeral and AC joints. Other: Patient is nearly edentulous. IMPRESSION: 1. No evidence of soft tissue infection in the neck. There is soft tissue stranding surrounding the right glenohumeral and AC joints with a possible fluid collection superior to the right AC joint. Please see separately dictated CT of the shoulder for characterization of these findings. 2. Hypoenhancing consolidative opacities in the bilateral upper lobes as well as the right lung apex, concerning for multifocal pneumonia. 3. Probable acute PE in a right upper lobe segmental pulmonary artery. Recommend further evaluation with a dedicated CT PE protocol study. Electronically Signed   By: Lorenza CambridgeHemant  Desai M.D.   On: 09/21/2022 14:42   CT SHOULDER RIGHT W CONTRAST  Result Date: 09/21/2022 CLINICAL DATA:  Right shoulder pain and swelling. Clinical suspicion of septic arthritis. EXAM: CT OF THE UPPER RIGHT EXTREMITY WITH CONTRAST TECHNIQUE: Multidetector CT imaging of the upper right extremity was performed according to the standard protocol following intravenous contrast administration. RADIATION DOSE REDUCTION: This exam was performed according to the departmental dose-optimization program which includes automated  exposure control, adjustment of the mA and/or kV according to patient size and/or use of iterative reconstruction technique. CONTRAST:  75mL OMNIPAQUE IOHEXOL 350 MG/ML SOLN COMPARISON:  None Available. FINDINGS: The glenohumeral joint is maintained. No CT findings suspicious for septic arthritis or osteomyelitis. The [facility] is intact. Mild degenerative changes. No erosive findings or destructive bony changes to suggest septic arthritis or osteomyelitis. In the subcutaneous tissues overlying the right clavicle there is a rim enhancing fluid collection suspicious for abscess and measuring a maximum of 4.4 x 1.5 cm. Associated surrounding cellulitis and myositis. Grossly by CT the rotator cuff tendons are  intact. Extensive pulmonary infiltrates are noted in the right upper lobe and also in the left lower lobe. The ribs are grossly intact. The sternoclavicular joint is intact. IMPRESSION: 1. 4.4 x 1.5 cm rim enhancing fluid collection in the subcutaneous tissues overlying the right clavicle suspicious for abscess. Associated surrounding cellulitis and myositis. 2. No CT findings suspicious for septic arthritis or osteomyelitis at the [facility] joint or glenohumeral joint. 3. Extensive pulmonary infiltrates in the right upper lobe and also in the left lower lobe. Electronically Signed   By: Rudie Meyer M.D.   On: 09/21/2022 14:39   DG Shoulder Right  Result Date: 09/21/2022 CLINICAL DATA:  Fall, shoulder pain EXAM: RIGHT SHOULDER - 2+ VIEW COMPARISON:  None Available. FINDINGS: Degenerative changes in the right shoulder. Joint space narrowing and spurring in the [facility] joint. Spurring at the rotator cuff insertion on the greater tuberosity. No acute bony abnormality. Specifically, no fracture, subluxation, or dislocation. Patchy airspace opacity in the right upper lobe as seen on prior chest x-ray. IMPRESSION: Degenerative changes.  No acute bony abnormality. Electronically Signed   By: Charlett Nose M.D.   On: 09/21/2022  00:54   DG Chest Port 1 View  Result Date: 09/20/2022 CLINICAL DATA:  Questionable sepsis EXAM: PORTABLE CHEST 1 VIEW COMPARISON:  09/12/2022 FINDINGS: Cardiomegaly. Patchy opacities in the upper lobes, right greater than left. No visible effusions. No acute bony abnormality. Old left rib fractures. IMPRESSION: Patchy opacities in the upper lobes, right greater than left. This could reflect pneumonia. Peripheral opacity in the left upper lobe is somewhat nodular. Recommend follow up to clearance. Electronically Signed   By: Charlett Nose M.D.   On: 09/20/2022 22:31

## 2022-09-22 NOTE — Plan of Care (Signed)

## 2022-09-23 ENCOUNTER — Inpatient Hospital Stay: Payer: Self-pay

## 2022-09-23 DIAGNOSIS — R7881 Bacteremia: Secondary | ICD-10-CM | POA: Diagnosis present

## 2022-09-23 DIAGNOSIS — J9601 Acute respiratory failure with hypoxia: Secondary | ICD-10-CM

## 2022-09-23 LAB — GLUCOSE, CAPILLARY
Glucose-Capillary: 130 mg/dL — ABNORMAL HIGH (ref 70–99)
Glucose-Capillary: 145 mg/dL — ABNORMAL HIGH (ref 70–99)
Glucose-Capillary: 152 mg/dL — ABNORMAL HIGH (ref 70–99)
Glucose-Capillary: 168 mg/dL — ABNORMAL HIGH (ref 70–99)
Glucose-Capillary: 175 mg/dL — ABNORMAL HIGH (ref 70–99)

## 2022-09-23 LAB — CBC
HCT: 32.9 % — ABNORMAL LOW (ref 36.0–46.0)
Hemoglobin: 10.4 g/dL — ABNORMAL LOW (ref 12.0–15.0)
MCH: 30 pg (ref 26.0–34.0)
MCHC: 31.6 g/dL (ref 30.0–36.0)
MCV: 94.8 fL (ref 80.0–100.0)
Platelets: 182 K/uL (ref 150–400)
RBC: 3.47 MIL/uL — ABNORMAL LOW (ref 3.87–5.11)
RDW: 14.8 % (ref 11.5–15.5)
WBC: 24.4 K/uL — ABNORMAL HIGH (ref 4.0–10.5)
nRBC: 0 % (ref 0.0–0.2)

## 2022-09-23 LAB — BRAIN NATRIURETIC PEPTIDE: B Natriuretic Peptide: 270.8 pg/mL — ABNORMAL HIGH (ref 0.0–100.0)

## 2022-09-23 LAB — LACTIC ACID, PLASMA: Lactic Acid, Venous: 1.7 mmol/L (ref 0.5–1.9)

## 2022-09-23 LAB — TROPONIN I (HIGH SENSITIVITY): Troponin I (High Sensitivity): 13 ng/L (ref <18)

## 2022-09-23 LAB — HEPARIN LEVEL (UNFRACTIONATED)
Heparin Unfractionated: 0.24 [IU]/mL — ABNORMAL LOW (ref 0.30–0.70)
Heparin Unfractionated: 0.39 IU/mL (ref 0.30–0.70)

## 2022-09-23 LAB — CULTURE, BLOOD (ROUTINE X 2)

## 2022-09-23 LAB — PROCALCITONIN: Procalcitonin: 5.62 ng/mL

## 2022-09-23 MED ORDER — DEXMEDETOMIDINE HCL IN NACL 400 MCG/100ML IV SOLN
0.4000 ug/kg/h | INTRAVENOUS | Status: DC
  Administered 2022-09-23: 0.6 ug/kg/h via INTRAVENOUS
  Administered 2022-09-23: 0.2 ug/kg/h via INTRAVENOUS
  Filled 2022-09-23 (×3): qty 100

## 2022-09-23 MED ORDER — INSULIN ASPART 100 UNIT/ML IJ SOLN
0.0000 [IU] | INTRAMUSCULAR | Status: DC
  Administered 2022-09-23: 1 [IU] via SUBCUTANEOUS
  Administered 2022-09-23: 2 [IU] via SUBCUTANEOUS
  Administered 2022-09-23: 1 [IU] via SUBCUTANEOUS
  Administered 2022-09-23: 2 [IU] via SUBCUTANEOUS
  Administered 2022-09-23 – 2022-09-24 (×3): 1 [IU] via SUBCUTANEOUS
  Administered 2022-09-24 (×2): 2 [IU] via SUBCUTANEOUS
  Administered 2022-09-24 (×2): 1 [IU] via SUBCUTANEOUS
  Administered 2022-09-25 (×3): 2 [IU] via SUBCUTANEOUS

## 2022-09-23 MED ORDER — ONDANSETRON HCL 4 MG/2ML IJ SOLN
INTRAMUSCULAR | Status: AC
  Filled 2022-09-23: qty 2

## 2022-09-23 MED ORDER — ONDANSETRON HCL 4 MG/2ML IJ SOLN
4.0000 mg | Freq: Once | INTRAMUSCULAR | Status: AC
  Administered 2022-09-23: 4 mg via INTRAVENOUS

## 2022-09-23 NOTE — Progress Notes (Signed)
ANTICOAGULATION CONSULT NOTE  Pharmacy Consult for heparin Indication: pulmonary embolus  No Known Allergies  Patient Measurements: Height: 5\' 4"  (162.6 cm) Weight: 58.1 kg (128 lb 1.4 oz) IBW/kg (Calculated) : 54.7 Heparin Dosing Weight: 58kg  Vital Signs: Temp: 98.7 F (37.1 C) (12/09 0730) Temp Source: Oral (12/09 0730) BP: 127/89 (12/09 1209) Pulse Rate: 129 (12/09 1209)  Labs: Recent Labs    09/20/22 2208 09/21/22 0200 09/22/22 0303 09/22/22 0312 09/22/22 0701 09/22/22 1014 09/22/22 1849 09/23/22 0753 09/23/22 1108  HGB 12.1 10.8* 9.9* 13.3  --   --   --  10.4*  --   HCT 36.2 32.9* 31.6* 39.0  --   --   --  32.9*  --   PLT 319 254 256  --   --   --   --  182  --   APTT 28  --   --   --   --   --   --   --   --   LABPROT 18.6*  --   --   --   --   --   --   --   --   INR 1.6*  --   --   --   --   --   --   --   --   HEPARINUNFRC  --   --   --   --  <0.10*  --  <0.10*  --  0.24*  CREATININE 1.62* 1.27* 1.49*  --   --   --   --   --   --   CKTOTAL 72  --   --   --   --   --   --   --   --   TROPONINIHS  --   --   --   --   --  30*  --  13  --      Estimated Creatinine Clearance: 23.8 mL/min (A) (by C-G formula based on SCr of 1.49 mg/dL (H)).   Assessment: 87 year old female admitted with SOB and shoulder pain. Patient taken to OR 12/7 for I&D of forearm abscess. Pulmonology notes concern for cavitary pneumonia and PE, and Pharmacy consulted for IV heparin dosing.   Heparin level sub-therapeutic at 0.24 units/mL on 1200 units/hr.  No issue with infusion per discussion with RN; no bleeding reported.  Goal of Therapy:  Heparin level 0.3-0.7 units/ml Monitor platelets by anticoagulation protocol: Yes   Plan:  Increase IV heparin to 1350 units/hr Check 8 hr heparin level Daily heparin level and CBC  Marykathryn Carboni D. 14/7, PharmD, BCPS, BCCCP 09/23/2022, 1:31 PM

## 2022-09-23 NOTE — Progress Notes (Signed)
eLink Physician-Brief Progress Note Patient Name: Madeline Jimenez DOB: [birth year] MRN: [id number]   Date of Service  09/23/2022  HPI/Events of Note  Pt intermittently agitated despite sedation with precedex  eICU Interventions  Placed order for bilateral wrist restraints.         Jomarion Mish M DELA CRUZ 09/23/2022, 8:23 PM

## 2022-09-23 NOTE — Progress Notes (Signed)
eLink Physician-Brief Progress Note Patient Name: Madeline Jimenez DOB: September 22, 1935 MRN: [id number]   Date of Service  09/23/2022  HPI/Events of Note  Hyperglycemia - Blood glucose = 168.  eICU Interventions  Plan: 1.Q 4 hour sensitive Novolog SSI.     Intervention Category Major Interventions: Hyperglycemia - active titration of insulin therapy  Moshe Wenger [place] 09/23/2022, 4:28 AM

## 2022-09-23 NOTE — Progress Notes (Addendum)
Clled to beside because of gag and secretions Shortwhile ago NSVTACH per RN\ EKK later me looked sinus -  Mag, phos, trop - normal  On arriva PSV Looks tired On precedex  Plan  Rest back on PRVC Fent prn while on PRVC but not on PSV  Versed prn while on PRVC ok Accpt HR >= 40 on precedex - can consider pressors if hypotensive  Give both now Zofran prn If needed we can get fent gtt     SIGNATURE    Dr. Kalman Shan, M.D., F.C.C.P,  Pulmonary and Critical Care Medicine Staff Physician, [facility] Center Director - Interstitial Lung Disease  Program  Medical Director - [facility] Pulmonary Fibrosis [facility] [place], [place], [address]   Pager: [phone], If no answer  -> Check AMION or Try [phone] Telephone (clinical office): [phone] Telephone (research): [phone]  3:58 PM 09/23/2022

## 2022-09-23 NOTE — Progress Notes (Signed)
PICC line ordered.  ID stated to not place until repeat blood cultures are negative.  Dr Marchelle Gearing on secure chat, aware.

## 2022-09-23 NOTE — Consult Note (Addendum)
NAME:  Madeline Jimenez, MRN:  [id number], DOB:  Mar 10, 1935, LOS: 2 ADMISSION DATE:  09/20/2022, CONSULTATION DATE: 12.7 REFERRING MD:  mcintyre , CHIEF COMPLAINT:  abnormal CT chest    BRIEF  87 year old female with COPD on 2L at home, HTN, HLD, PVD with chief complaint of right shoulder pain and shortness of breath admitted for AHRF and sepsis secondary to pneumonia +/- cellulitis. She had just been discharged from the hospital on 11/30 after being admitted with acute on chronic respiratory failure felt secondary to COVID pneumonia she was started on antiviral therapy and Decadron.  She had reported having ongoing right shoulder pain since the time of discharge. In the ED he was found to be hypotensive and tachycardic cultures were sentshe was started on IV antibiotics for community-acquired pneumonia, but also started on IV vancomycin for concern about cellulitis involving the right shoulder. Cultures positive for MRSA bacteremia from clavicular abscess with incidental PE as well as lung consolidation with areas of cavitation found on CT. Ortho consulted and patient underwent I&D. PCCM consulted for abnormal CT findings. Additional consultation services included infectious disease, and orthopedics, who brought the patient to the operating room for debridement of her right upper extremity cellulitis  She was taken to OR evening of 12/7 and had I&D of left forearm and right shoulder abscesses. Post operatively, she remained on the ventilator and PCCM was called in consultation.  Pertinent  Medical History  COVID just discharged 11/30 COPD HTN HL Hyponatremia  PVD Significant Hospital Events: Including procedures, antibiotic start and stop dates in addition to other pertinent events   12/7 admitted w/ SOB and right shoulder and arm pain. MRSA bacteremia + ID consulted. Ortho consulted for I&D of right shoulder and forarm abscess Pulm consulted for cavitary PNA and PE w/ primary service also  wondering about malignancy. Rt shoulder culture - few GPC in clusters Left arm wound - rare GPC MRSA PCR - POSITIVe 12/7 to OR for I&D of left forearm and right shoulder abscesses - Sedated post op. Getting US guided IV by IV team. Tissue right shoulder 09/21/2022 [on Ancef] Wound right shoulder [on Ancef 09/21/2022] 12/8 - on vent 50% fio2, afebrile, wbc 53k and better. On abx. Positive balance +1.7L. On fent gtt, levophd gtt and heparin gtt (for PE). Folowing commands on fent gtt  Interim History / Subjective:   12/9 -40% fio2 on vent, On heparin gtt, afebrile.  No new cultures.  On as needed sedation protocol.  Yesterday got apneic after fentanyl but was on pressure support at that time.  Off pressors.  Doppler lower extremity without DVT.  Echocardiogram without any RV strain or elevated pulmonary pressures.  EF 55% grade 1 diastolic dysfunction present. Trop max 30. Nl [place]. BNP 138 and improved  - uanble to get lab draw today  - Niece reports red bump in left forearm at time of arrival to home on 09/14/22 at discharge and was warm to touch -> niece upset this was not addressed and feels is source of MRSA   Objective   Blood pressure (!) 148/45, pulse (!) 58, temperature 98.3 F (36.8 C), temperature source Oral, resp. rate 19, height 5\' 4"  (1.626 m), weight 58.1 kg, SpO2 95 %.    Vent Mode: PRVC FiO2 (%):  [40 %] 40 % Set Rate:  [15 bmp] 15 bmp Vt Set:  [430 mL] 430 mL PEEP:  [5 cmH20] 5 cmH20 Pressure Support:  [15 cmH20] 15 cmH20 Plateau Pressure:  [16  cmH20-17 cmH20] 17 cmH20   Intake/Output Summary (Last 24 hours) at 09/23/2022 0745 Last data filed at 09/23/2022 0600 Gross per 24 hour  Intake 2368.81 ml  Output 800 ml  Net 1568.81 ml   Filed Weights   09/20/22 2146 09/23/22 0314  Weight: 58 kg 58.1 kg    General Appearance:  Looks frail on ventilator Head:  Normocephalic, without obvious abnormality, atraumatic Eyes:  PERRL - uyes, conjunctiva/corneas - muddy      Ears:  Normal external ear canals, both ears Nose:  G tube - no Throat:  ETT TUBE - yes , OG tube - yes with TF Neck:  Supple,  No enlargement/tenderness/nodules Lungs: Clear to auscultation bilaterally, Ventilator   Synchrony - yes but some tachypneic on 40% Heart:  S1 and S2 normal, no murmur, CVP - no.  Pressors - no Abdomen:  Soft, no masses, no organomegaly Genitalia / Rectal:  Not done Extremities:  Extremities- intact Skin:  ntact in exposed areas . Sacral area - x Neurologic:  Sedation - none  -> RASS - +1 . Moves all 4s - yes. CAM-ICU - not tested . Orientation - follows simple commands           Assessment & Plan:   Hx of COPD  Hx of chronic resp faioure - 2L O2 Now: Acute on chronic respiratory failure (on 2L O2) secondary to MRSA multilobar cavitary pneumonia, superimposed on COPD & further complicated by segmental pulmonary embolus - CT imaging reviewed with bilateral mass-like consolidations with cavitation seen in LUL mass and maybe in LLL mass. Suspect this may be septic emboli. Low on differential but cannot rule out malignancy.   09/23/2022 - > does yes meet criteria for SBT but ysterday got apneic on PSV with 18mcg fent   Plan  - SBT  again 09/23/2022 and assess for extubation - Full vent support. - Bronchial hygiene. - BD's, changed to nebs while intubated. - needs followp CT at some point   Segmental PE 09/21/22 (Doppler negative for DVT, echo without RV strain near normal troponin and lactic acid]   12/9 - tolerating heparin. No bleed. No heparin level today due to difficult stick  Plan - Continue heparin infusion with plans to transition to DOAC.  - get PICC line    Shock - multifactorial 2/2 sepsis in setting of L forearm and R shoulder abscesses with MRSA bacteremia (s/p I&D by ortho 12/7) and also likely sedation related.  Doubt PE playing a role  1298 - off levophed  PLAN -Discontinue Levophed from the [facility] - Mean arterial pressure goal  greater than 65  SEdation needs on ventilator  12/9- no delirium. Followign commands on fentanyl as needed Versed as needed but does get apneic on pressure support ventilation if fentanyl given   Plan - do fent prn - do versed prn - RASS goal 0 to -2  Recent COVID PNA (09/12/22) - diagnosed during last admission 11/28 - 11/30, s/p Molnupiravir and Decadron. MRSA absesccs and sepsis  09/23/22: Afebrile  Plan  - Per ID   AKI.  12/8 - improved and worse.  No labs available for 09/23/2022  Plan - Fluids. - Follow BMP.   AT riskf ro hyperglycemia  Plan  - hgba1c - ssi  Best Practice (right click and "Reselect all SmartList Selections" daily)  Diet: NPO but getting tube feeds VTE ppx: Heparin gtt. GI ppx: PPI. Code status: DNR but full medical care GU _ foley - > change to purewicl 09/23/22 (  d/w Agricultural consultant) Others: Order PICC line 09/23/22.    Family :  12/8 - called neiece Manuela Schwartz [place] 09/23/22 called niece Tresa Res [phone] -> updated over phone       [place]   The patient Deeksha Mcleland is critically ill with multiple organ systems failure and requires high complexity decision making for assessment and support, frequent evaluation and titration of therapies, application of advanced monitoring technologies and extensive interpretation of multiple databases and discussion with other appropriate health care personnel such as bedside nurses, social workers, case Freight forwarder, consultants, respiratory therapists, nutritionists, secretaries etc.,  Critical care time includes but is not restricted to just documentation time. Documentation can happen in parallel or sequential to care time depending on case mix urgency and priorities for the shift. So, overall critical Care Time devoted to patient care services described in this note is  30 Minutes.   This time reflects time of care of this signee Dr Brand Males which includ  does not reflect procedure time, or teaching time or supervisory time of PA/NP/Med student/Med Resident etc but could involve care discussion time     Dr. Brand Males, M.D., [facility].C.P Pulmonary and Critical Care Medicine Medical Director - [facility] ICU Staff Physician, [place] [facility] Pager: [phone], If no answer or between  15:00h - 7:00h: call [phone]  09/23/2022 8:15 AM    LABS    PULMONARY Recent Labs  Lab 09/22/22 0312  PHART 7.407  PCO2ART 45.0  PO2ART 142*  HCO3 28.4*  TCO2 30  O2SAT 99    CBC Recent Labs  Lab 09/20/22 2208 09/21/22 0200 09/22/22 0303 09/22/22 0312  HGB 12.1 10.8* 9.9* 13.3  HCT 36.2 32.9* 31.6* 39.0  WBC 66.9* 57.5* 53.1*  --   PLT 319 254 256  --     COAGULATION Recent Labs  Lab 09/20/22 2208  INR 1.6*    CARDIAC  No results for input(s): "TROPONINI" in the last 168 hours. No results for input(s): "PROBNP" in the last 168 hours.   CHEMISTRY Recent Labs  Lab 09/20/22 2208 09/21/22 0200 09/22/22 0303 09/22/22 0312 09/22/22 1704  NA 136 136 140 139  --   K 3.8 3.5 3.9 3.7  --   CL 92* 99 102  --   --   CO2 31 26 28   --   --   GLUCOSE 275* 193* 142*  --   --   BUN 44* 37* 37*  --   --   CREATININE 1.62* 1.27* 1.49*  --   --   CALCIUM 9.5 8.0* 8.0*  --   --   MG  --  1.5* 2.2  --  2.3  PHOS  --   --   --   --  3.9   Estimated Creatinine Clearance: 23.8 mL/min (A) (by C-G formula based on SCr of 1.49 mg/dL (H)).   LIVER Recent Labs  Lab 09/20/22 2208  AST 19  ALT 30  ALKPHOS 97  BILITOT 0.5  PROT 6.5  ALBUMIN 2.8*  INR 1.6*     INFECTIOUS Recent Labs  Lab 09/20/22 2208 09/21/22 0200 09/22/22 1014  LATICACIDVEN 2.8* 1.6 1.2  PROCALCITON  --   --  12.05     ENDOCRINE CBG (last 3)  Recent Labs    09/22/22 2001 09/23/22 0006 09/23/22 0308  GLUCAP 175* 152* 168*  IMAGING x48h  - image(s) personally visualized  -    highlighted in bold VAS [place] LOWER EXTREMITY VENOUS (DVT)  Result Date: 09/22/2022  Lower Venous DVT Study Patient Name:  [facility]  Date of Exam:   09/22/2022 Medical Rec #: [id number]         Accession #:    [id number] Date of Birth: June 12, 1935         Patient Gender: F Patient Age:   36 years Exam Location:  [facility] Procedure:      VAS [place] LOWER EXTREMITY VENOUS (DVT) Referring Phys: Montey Hora --------------------------------------------------------------------------------  Indications: Pulmonary embolism.  Limitations: Poor ultrasound/tissue interface and body habitus. Comparison Study: No previous exams Performing Technologist: Jody Hill RVT, RDMS  Examination Guidelines: A complete evaluation includes B-mode imaging, spectral Doppler, color Doppler, and power Doppler as needed of all accessible portions of each vessel. Bilateral testing is considered an integral part of a complete examination. Limited examinations for reoccurring indications may be performed as noted. The reflux portion of the exam is performed with the patient in reverse Trendelenburg.  +---------+---------------+---------+-----------+----------+--------------+ RIGHT    CompressibilityPhasicitySpontaneityPropertiesThrombus Aging +---------+---------------+---------+-----------+----------+--------------+ CFV      Full           Yes      Yes                                 +---------+---------------+---------+-----------+----------+--------------+ SFJ      Full                                                        +---------+---------------+---------+-----------+----------+--------------+ FV Prox  Full           Yes      Yes                                 +---------+---------------+---------+-----------+----------+--------------+ FV Mid   Full           Yes      Yes                                 +---------+---------------+---------+-----------+----------+--------------+ FV DistalFull            Yes      Yes                                 +---------+---------------+---------+-----------+----------+--------------+ PFV      Full                                                        +---------+---------------+---------+-----------+----------+--------------+ POP      Full           Yes      Yes                                 +---------+---------------+---------+-----------+----------+--------------+ PTV  Full                                                        +---------+---------------+---------+-----------+----------+--------------+ PERO     Full                                                        +---------+---------------+---------+-----------+----------+--------------+   +---------+---------------+---------+-----------+----------+--------------+ LEFT     CompressibilityPhasicitySpontaneityPropertiesThrombus Aging +---------+---------------+---------+-----------+----------+--------------+ CFV      Full           Yes      Yes                                 +---------+---------------+---------+-----------+----------+--------------+ SFJ      Full                                                        +---------+---------------+---------+-----------+----------+--------------+ FV Prox  Full           Yes      Yes                                 +---------+---------------+---------+-----------+----------+--------------+ FV Mid   Full           Yes      Yes                                 +---------+---------------+---------+-----------+----------+--------------+ FV DistalFull           Yes      Yes                                 +---------+---------------+---------+-----------+----------+--------------+ PFV      Full                                                        +---------+---------------+---------+-----------+----------+--------------+ POP      Full           Yes      Yes                                  +---------+---------------+---------+-----------+----------+--------------+ PTV      Full                                                        +---------+---------------+---------+-----------+----------+--------------+ PERO  Full                                                        +---------+---------------+---------+-----------+----------+--------------+     Summary: BILATERAL: - No evidence of deep vein thrombosis seen in the lower extremities, bilaterally. -No evidence of popliteal cyst, bilaterally.   *See table(s) above for measurements and observations. Electronically signed by Servando Snare MD on 09/22/2022 at 5:16:47 PM.    Final    ECHOCARDIOGRAM COMPLETE  Result Date: 09/22/2022    ECHOCARDIOGRAM REPORT   Patient Name:   [facility] Date of Exam: 09/22/2022 Medical Rec #:  [id number]        Height:       64.0 in Accession #:    [id number]       Weight:       127.9 lb Date of Birth:  1935-05-01        BSA:          1.618 m Patient Age:    80 years         BP:           131/57 mmHg Patient Gender: F                HR:           72 bpm. Exam Location:  Inpatient Procedure: 2D Echo, Cardiac Doppler, Color Doppler and Intracardiac            Opacification Agent Indications:     Bacteremia R78.81  History:         Patient has no prior history of Echocardiogram examinations.                  Signs/Symptoms:Shortness of Breath; Risk Factors:Dyslipidemia,                  Hypertension and Former Smoker.  Sonographer:     Greer Pickerel Referring Phys:  [id number] Dane A MOORE Diagnosing Phys: Dixie Dials MD  Sonographer Comments: Technically challenging study due to limited acoustic windows, suboptimal parasternal window, suboptimal apical window, suboptimal subcostal window and echo performed with patient supine and on artificial respirator. Image acquisition challenging due to patient body habitus and Image acquisition challenging due to respiratory motion. IMPRESSIONS  1. Left  ventricular ejection fraction, by estimation, is 50 to 55%. The left ventricle has low normal function. The left ventricle has no regional wall motion abnormalities. Left ventricular diastolic parameters are consistent with Grade I diastolic dysfunction (impaired relaxation).  2. Right ventricular systolic function is low normal. The right ventricular size is normal. There is normal pulmonary artery systolic pressure.  3. Left atrial size was moderately dilated.  4. There is no evidence of cardiac tamponade.  5. Can not exclude vegetation.. The mitral valve is degenerative. Mild mitral valve regurgitation. Moderate mitral annular calcification.  6. Can not exclude vegetation. The aortic valve is tricuspid. There is moderate calcification of the aortic valve. There is mild thickening of the aortic valve. Aortic valve regurgitation is not visualized. Aortic valve sclerosis/calcification is present, without any evidence of aortic stenosis.  7. There is Moderate (Grade III) atheroma plaque involving the aortic root and ascending aorta.  8. The inferior vena cava is dilated in size with <50% respiratory variability, suggesting right atrial pressure of 15 mmHg. FINDINGS  Left Ventricle: Left ventricular ejection fraction, by estimation, is 50 to 55%. The left ventricle has low normal function. The left ventricle has no regional wall motion abnormalities. Definity contrast agent was given IV to delineate the left ventricular endocardial borders. The left ventricular internal cavity size was normal in size. There is no left ventricular hypertrophy. Left ventricular diastolic parameters are consistent with Grade I diastolic dysfunction (impaired relaxation). Right Ventricle: The right ventricular size is normal. No increase in right ventricular wall thickness. Right ventricular systolic function is low normal. There is normal pulmonary artery systolic pressure. The tricuspid regurgitant velocity is 2.29 m/s,  and with an  assumed right atrial pressure of 15 mmHg, the estimated right ventricular systolic pressure is 123XX123 mmHg. Left Atrium: Left atrial size was moderately dilated. Right Atrium: Right atrial size was normal in size. Pericardium: Trivial pericardial effusion is present. There is no evidence of cardiac tamponade. Mitral Valve: Can not exclude vegetation. The mitral valve is degenerative in appearance. There is mild thickening of the posterior mitral valve leaflet(s). There is moderate calcification of the posterior mitral valve leaflet(s). Moderate mitral annular  calcification. Mild mitral valve regurgitation. Tricuspid Valve: The tricuspid valve is normal in structure. Tricuspid valve regurgitation is mild. Aortic Valve: Can not exclude vegetation. The aortic valve is tricuspid. There is moderate calcification of the aortic valve. There is mild thickening of the aortic valve. There is moderate aortic valve annular calcification. Aortic valve regurgitation is not visualized. Aortic valve sclerosis/calcification is present, without any evidence of aortic stenosis. Pulmonic Valve: The pulmonic valve was normal in structure. Pulmonic valve regurgitation is not visualized. Aorta: The aortic root is normal in size and structure. There is moderate (Grade III) atheroma plaque involving the aortic root and ascending aorta. Venous: The inferior vena cava is dilated in size with less than 50% respiratory variability, suggesting right atrial pressure of 15 mmHg. IAS/Shunts: The atrial septum is grossly normal.  LEFT VENTRICLE PLAX 2D LVOT diam:     1.70 cm     Diastology LV SV:         56          LV e' medial:    5.77 cm/s LV SV Index:   35          LV E/e' medial:  13.7 LVOT Area:     2.27 cm    LV e' lateral:   5.44 cm/s                            LV E/e' lateral: 14.5  LV Volumes (MOD) LV vol d, MOD A2C: 89.0 ml LV vol d, MOD A4C: 76.0 ml LV vol s, MOD A2C: 57.3 ml LV vol s, MOD A4C: 33.6 ml LV SV MOD A2C:     31.7 ml LV SV  MOD A4C:     76.0 ml LV SV MOD BP:      37.4 ml RIGHT VENTRICLE RV S prime:     21.20 cm/s TAPSE (M-mode): 1.7 cm LEFT ATRIUM            Index        RIGHT ATRIUM           Index LA diam:      4.20 cm  2.60 cm/m   RA Area:     27.80 cm LA Vol (A2C): 67.4 ml  41.67 ml/m  RA Volume:   105.00 ml 64.91 ml/m LA  Vol (A4C): 106.0 ml 65.53 ml/m  AORTIC VALVE LVOT Vmax:   114.00 cm/s LVOT Vmean:  82.500 cm/s LVOT VTI:    0.248 m  AORTA Ao Root diam: 3.20 cm MITRAL VALVE               TRICUSPID VALVE MV Area (PHT): 2.44 cm    TV Peak grad:   25.6 mmHg MV Decel Time: 311 msec    TV Vmax:        2.53 m/s MV E velocity: 78.80 cm/s  TR Peak grad:   21.0 mmHg MV A velocity: 79.30 cm/s  TR Vmax:        229.00 cm/s MV E/A ratio:  0.99                            SHUNTS                            Systemic VTI:  0.25 m                            Systemic Diam: 1.70 cm Dixie Dials MD Electronically signed by Dixie Dials MD Signature Date/Time: 09/22/2022/11:26:23 AM    Final    Portable Chest xray  Result Date: 09/22/2022 CLINICAL DATA:  Respiratory failure. EXAM: PORTABLE CHEST 1 VIEW COMPARISON:  CXR 09/20/22, CT PE 09/21/22 FINDINGS: The endotracheal tube is below the thoracic inlet and above the carina. Enteric tube courses below diaphragm with the tip out of the field of view and side hole projecting over the expected location of the stomach. No pleural effusion. Redemonstrated bilateral upper lobe consolidative opacities with cavitation on the left. These are better assessed on recent CT PE protocol study. Cardiac and mediastinal contours are otherwise unchanged. Visualized upper abdomen is unremarkable. Redemonstrated healing left-sided rib fractures. There is an apparent pleural line at the left lung apex, favored to represent a skin fold. IMPRESSION: 1. Redemonstrated bilateral upper lobe consolidative opacities with cavitation on the left. These are better assessed on recent CT PE protocol study. 2. There is an  apparent pleural line at the left lung apex, favored to represent a skin fold. Attention on follow-up. 3. Endotracheal tube is below the thoracic inlet and above the carina. Enteric tube projects over the expected location of the stomach. Electronically Signed   By: Marin Roberts M.D.   On: 09/22/2022 08:32   DG Abd 1 View  Result Date: 09/21/2022 CLINICAL DATA:  OG tube placement EXAM: ABDOMEN - 1 VIEW COMPARISON:  CT 09/21/2022 FINDINGS: Cardiomegaly. Esophageal tube tip overlies the proximal stomach. Contrast within the renal collecting systems IMPRESSION: Esophageal tube tip overlies the proximal stomach. Electronically Signed   By: Donavan Foil M.D.   On: 09/21/2022 21:50   DG Forearm Left  Result Date: 09/21/2022 CLINICAL DATA:  Pain EXAM: LEFT FOREARM - 2 VIEW COMPARISON:  None Available. FINDINGS: There is no evidence of fracture or other focal bone lesions. Soft tissues are unremarkable. IMPRESSION: No fracture or dislocation of the left radius or ulna. Electronically Signed   By: Delanna Ahmadi M.D.   On: 09/21/2022 20:40   CT Angio Chest Pulmonary Embolism (PE) W or WO Contrast  Result Date: 09/21/2022 CLINICAL DATA:  An 87 year old female presents with history of shortness of breath. EXAM: CT ANGIOGRAPHY CHEST WITH CONTRAST TECHNIQUE: Multidetector CT imaging of the  chest was performed using the standard protocol during bolus administration of intravenous contrast. Multiplanar CT image reconstructions and MIPs were obtained to evaluate the vascular anatomy. RADIATION DOSE REDUCTION: This exam was performed according to the departmental dose-optimization program which includes automated exposure control, adjustment of the mA and/or kV according to patient size and/or use of iterative reconstruction technique. CONTRAST:  52mL OMNIPAQUE IOHEXOL 350 MG/ML SOLN COMPARISON:  None available aside from shoulder imaging of the same date. FINDINGS: Cardiovascular: Calcified and noncalcified aortic  atherosclerotic plaque in the thoracic aorta. No aneurysmal dilation. Small pericardial effusion. Normal heart size. Straightening of the interventricular septum. Main pulmonary artery is well opacified centrally with the density of 565 Hounsfield units. Emboli in segmental level branches to posterior RIGHT upper lobe, also in posterior RIGHT lower lobe branches at the segmental and subsegmental level. No additional signs of pulmonary embolism. No lobar level or central pulmonary embolism. Mediastinum/Nodes: Mildly patulous esophagus. No thoracic inlet, axillary or hilar adenopathy. No signs of mediastinal adenopathy though there are top-normal size lymph nodes throughout the pre-vascular space and in the AP window largest approximately 10 mm (image 55/5) also precarinal lymph node 11 mm (image 52/5) 11 mm. (Image 72/5) 11 mm LEFT hilar lymph node. Thickening of the esophagus with loss of discrete lumen in the mid esophagus (image 61/5) significance uncertain. Not substantially dilated above this level. Lungs/Pleura: Bilateral areas of consolidation and septal thickening. Masslike consolidation in the RIGHT upper lobe (image 32/6) 5.3 x 2.2 cm. Peripheral masslike consolidation in the RIGHT upper lobe (image 50/6) 4.5 x 5.6 cm. Scattered pulmonary nodules in the RIGHT chest, largest discrete nodule 10 mm (image 59/6) this is in the medial RIGHT lower lobe. Cavitary appearing area with lucency along the margin of the pleural surface associated with does are peripheral septal thickening and consolidative changes along the periphery of the LEFT upper lobe measuring 7.4 x 3.5 cm. Similar appearing area in the LEFT lower lobe superior segment (image 78/5) 3.0 x 1.8 cm with scattered smaller foci of nodularity throughout the LEFT chest. Background pulmonary emphysema. No pleural effusion or sign of consolidative changes. Upper Abdomen: Limited assessment of upper abdominal contents. Imaged portions of liver, spleen, and  adrenal glands are unremarkable. Musculoskeletal: Age indeterminate compression fracture which is seen in the lower thoracic spine not grossly changed since previous radiograph from September 12, 2022 no more remote comparison is are available. Spinal degenerative changes. Some degenerative changes appear to surround the compression fracture at the thoracolumbar junction. Stranding about the RIGHT shoulder in this patient with suspected abscess with cellulitis and myositis. Review of the MIP images confirms the above findings. IMPRESSION: 1. Segmental level pulmonary emboli in the posterior RIGHT upper lobe. 2. Straightening of the interventricular septum is of uncertain significance but may be indicative of elevated RIGHT heart pressures. Correlation with echocardiography could be considered as warranted. 3. Bilateral areas of consolidation and septal thickening with areas of cavitation. While findings could reflect multifocal pneumonia with areas of necrosis the appearance does raise the question of multifocal bronchogenic pneumonic neoplasm and in addition there are scattered bilateral pulmonary nodules which may be indicative of metastases. Consider pulmonology consultation for further evaluation. Ultimately short interval follow-up, PET or biopsy may be warranted depending on clinical presentation. 4. Thickening of the esophagus with loss of discrete lumen in the mid esophagus. This areas not well assessed. Correlate with any symptoms of dysphagia. Given the relatively focal appearance of this area consider dedicated esophageal evaluation on a  nonemergent basis. This would be best done outside of the acute setting. 5. Top-normal lymph nodes throughout the chest, again reactive versus is neoplastic. 6. Age indeterminate compression fracture at the thoracolumbar junction not grossly changed since previous radiograph from September 12, 2022 no more remote comparison is are available. This is favored to be chronic  based on appearance but would correlate with any signs of back pain in this area. 7. Aortic atherosclerosis. 8. RIGHT shoulder findings better illustrated on the dedicated shoulder evaluation. 9. Emphysema. Aortic Atherosclerosis (ICD10-I70.0) and Emphysema (ICD10-J43.9). Critical Value/emergent results were called by telephone at the time of interpretation on 09/21/2022 at 4:34 pm to provider Dr. Julieanne Manson, Who verbally acknowledged these results. A Electronically Signed   By: Donzetta Kohut M.D.   On: 09/21/2022 16:34   CT SOFT TISSUE NECK W CONTRAST  Result Date: 09/21/2022 CLINICAL DATA:  Soft tissue infection suspected. EXAM: CT NECK WITH CONTRAST TECHNIQUE: Multidetector CT imaging of the neck was performed using the standard protocol following the bolus administration of intravenous contrast. RADIATION DOSE REDUCTION: This exam was performed according to the departmental dose-optimization program which includes automated exposure control, adjustment of the mA and/or kV according to patient size and/or use of iterative reconstruction technique. CONTRAST:  49mL OMNIPAQUE IOHEXOL 350 MG/ML SOLN COMPARISON:  Chest radiograph 09/20/2022 FINDINGS: Pharynx and larynx: Bilateral tonsils are normal in appearance. The epiglottis is normal in appearance. No evidence of odontogenic soft tissue abscess. No evidence of retropharyngeal edema. Bilateral aryepiglottic folds are normal in appearance. Salivary glands: No inflammation, mass, or stone. Thyroid: Normal. Lymph nodes: None enlarged or abnormal density. Vascular: Negative. Limited intracranial: Negative. Visualized orbits: Bilateral lens replacement. Mastoids and visualized paranasal sinuses: Mucosal thickening bilateral maxillary sinuses. No mastoid effusion. Skeleton: No acute or aggressive process. Upper chest: There are hypoenhancing consolidative opacities in the bilateral upper lobes as well as the right lung apex. There is likely an acute PE in a right upper  lobe segmental pulmonary artery (series 3, image 135). There are prominent mediastinal lymph nodes which are nonspecific, and possibly reactive. There is focal soft tissue thickening and a possible fluid collection superior to the right AC joint. Please see separately dictated CT of the shoulder for characterization of findings are in the right glenohumeral and AC joints. Other: Patient is nearly edentulous. IMPRESSION: 1. No evidence of soft tissue infection in the neck. There is soft tissue stranding surrounding the right glenohumeral and AC joints with a possible fluid collection superior to the right AC joint. Please see separately dictated CT of the shoulder for characterization of these findings. 2. Hypoenhancing consolidative opacities in the bilateral upper lobes as well as the right lung apex, concerning for multifocal pneumonia. 3. Probable acute PE in a right upper lobe segmental pulmonary artery. Recommend further evaluation with a dedicated CT PE protocol study. Electronically Signed   By: Lorenza Cambridge M.D.   On: 09/21/2022 14:42   CT SHOULDER RIGHT W CONTRAST  Result Date: 09/21/2022 CLINICAL DATA:  Right shoulder pain and swelling. Clinical suspicion of septic arthritis. EXAM: CT OF THE UPPER RIGHT EXTREMITY WITH CONTRAST TECHNIQUE: Multidetector CT imaging of the upper right extremity was performed according to the standard protocol following intravenous contrast administration. RADIATION DOSE REDUCTION: This exam was performed according to the departmental dose-optimization program which includes automated exposure control, adjustment of the mA and/or kV according to patient size and/or use of iterative reconstruction technique. CONTRAST:  85mL OMNIPAQUE IOHEXOL 350 MG/ML SOLN COMPARISON:  None Available.  FINDINGS: The glenohumeral joint is maintained. No CT findings suspicious for septic arthritis or osteomyelitis. The [facility] joint is intact. Mild degenerative changes. No erosive findings or  destructive bony changes to suggest septic arthritis or osteomyelitis. In the subcutaneous tissues overlying the right clavicle there is a rim enhancing fluid collection suspicious for abscess and measuring a maximum of 4.4 x 1.5 cm. Associated surrounding cellulitis and myositis. Grossly by CT the rotator cuff tendons are intact. Extensive pulmonary infiltrates are noted in the right upper lobe and also in the left lower lobe. The ribs are grossly intact. The sternoclavicular joint is intact. IMPRESSION: 1. 4.4 x 1.5 cm rim enhancing fluid collection in the subcutaneous tissues overlying the right clavicle suspicious for abscess. Associated surrounding cellulitis and myositis. 2. No CT findings suspicious for septic arthritis or osteomyelitis at the [facility] joint or glenohumeral joint. 3. Extensive pulmonary infiltrates in the right upper lobe and also in the left lower lobe. Electronically Signed   By: Marijo Sanes M.D.   On: 09/21/2022 14:39

## 2022-09-24 ENCOUNTER — Inpatient Hospital Stay (HOSPITAL_COMMUNITY): Payer: Medicare Other

## 2022-09-24 DIAGNOSIS — E43 Unspecified severe protein-calorie malnutrition: Secondary | ICD-10-CM | POA: Insufficient documentation

## 2022-09-24 DIAGNOSIS — R7881 Bacteremia: Secondary | ICD-10-CM

## 2022-09-24 DIAGNOSIS — B9562 Methicillin resistant Staphylococcus aureus infection as the cause of diseases classified elsewhere: Secondary | ICD-10-CM

## 2022-09-24 LAB — COMPREHENSIVE METABOLIC PANEL
ALT: 26 U/L (ref 0–44)
AST: 25 U/L (ref 15–41)
Albumin: 1.8 g/dL — ABNORMAL LOW (ref 3.5–5.0)
Alkaline Phosphatase: 70 U/L (ref 38–126)
Anion gap: 12 (ref 5–15)
BUN: 61 mg/dL — ABNORMAL HIGH (ref 8–23)
CO2: 27 mmol/L (ref 22–32)
Calcium: 8.7 mg/dL — ABNORMAL LOW (ref 8.9–10.3)
Chloride: 108 mmol/L (ref 98–111)
Creatinine, Ser: 1.68 mg/dL — ABNORMAL HIGH (ref 0.44–1.00)
GFR, Estimated: 30 mL/min — ABNORMAL LOW (ref >60)
Glucose, Bld: 163 mg/dL — ABNORMAL HIGH (ref 70–99)
Potassium: 3.9 mmol/L (ref 3.5–5.1)
Sodium: 147 mmol/L — ABNORMAL HIGH (ref 135–145)
Total Bilirubin: 0.6 mg/dL (ref 0.3–1.2)
Total Protein: 5.2 g/dL — ABNORMAL LOW (ref 6.5–8.1)

## 2022-09-24 LAB — ACID FAST SMEAR (AFB, MYCOBACTERIA)
Acid Fast Smear: NEGATIVE
Acid Fast Smear: NEGATIVE
Acid Fast Smear: NEGATIVE
Acid Fast Smear: NEGATIVE
Acid Fast Smear: NEGATIVE

## 2022-09-24 LAB — URINALYSIS, ROUTINE W REFLEX MICROSCOPIC
Bilirubin Urine: NEGATIVE
Glucose, UA: NEGATIVE mg/dL
Ketones, ur: NEGATIVE mg/dL
Nitrite: NEGATIVE
Protein, ur: 30 mg/dL — AB
RBC / HPF: >50 RBC/hpf — ABNORMAL HIGH (ref 0–5)
Specific Gravity, Urine: 1.026 (ref 1.005–1.030)
pH: 5 (ref 5.0–8.0)

## 2022-09-24 LAB — CBC
HCT: 29.8 % — ABNORMAL LOW (ref 36.0–46.0)
Hemoglobin: 9.4 g/dL — ABNORMAL LOW (ref 12.0–15.0)
MCH: 29.9 pg (ref 26.0–34.0)
MCHC: 31.5 g/dL (ref 30.0–36.0)
MCV: 94.9 fL (ref 80.0–100.0)
Platelets: 190 10*3/uL (ref 150–400)
RBC: 3.14 MIL/uL — ABNORMAL LOW (ref 3.87–5.11)
RDW: 14.8 % (ref 11.5–15.5)
WBC: 18.5 10*3/uL — ABNORMAL HIGH (ref 4.0–10.5)
nRBC: 0 % (ref 0.0–0.2)

## 2022-09-24 LAB — BLOOD GAS, VENOUS
Acid-Base Excess: 6.8 mmol/L — ABNORMAL HIGH (ref 0.0–2.0)
Bicarbonate: 33.7 mmol/L — ABNORMAL HIGH (ref 20.0–28.0)
Drawn by: 5286
O2 Saturation: 49.4 %
Patient temperature: 37
pCO2, Ven: 57 mmHg (ref 44–60)
pH, Ven: 7.38 (ref 7.25–7.43)
pO2, Ven: 35 mmHg (ref 32–45)

## 2022-09-24 LAB — HEPARIN LEVEL (UNFRACTIONATED): Heparin Unfractionated: 0.46 IU/mL (ref 0.30–0.70)

## 2022-09-24 LAB — GLUCOSE, CAPILLARY
Glucose-Capillary: 99 mg/dL (ref 70–99)
Glucose-Capillary: 147 mg/dL — ABNORMAL HIGH (ref 70–99)
Glucose-Capillary: 183 mg/dL — ABNORMAL HIGH (ref 70–99)
Glucose-Capillary: 132 mg/dL — ABNORMAL HIGH (ref 70–99)

## 2022-09-24 LAB — PROCALCITONIN: Procalcitonin: 4.47 ng/mL

## 2022-09-24 LAB — MAGNESIUM: Magnesium: 2.3 mg/dL (ref 1.7–2.4)

## 2022-09-24 LAB — PHOSPHORUS: Phosphorus: 4.5 mg/dL (ref 2.5–4.6)

## 2022-09-24 MED ORDER — SODIUM CHLORIDE 0.9% FLUSH
3.0000 mL | Freq: Two times a day (BID) | INTRAVENOUS | Status: DC
  Administered 2022-09-24 – 2022-10-02 (×16): 3 mL via INTRAVENOUS

## 2022-09-24 MED ORDER — VANCOMYCIN HCL IN DEXTROSE 1-5 GM/200ML-% IV SOLN
1000.0000 mg | INTRAVENOUS | Status: DC
  Administered 2022-09-24: 1000 mg via INTRAVENOUS
  Filled 2022-09-24: qty 200

## 2022-09-24 MED ORDER — ONDANSETRON HCL 4 MG/2ML IJ SOLN: 4.0000 mg | Freq: Four times a day (QID) | INTRAMUSCULAR | Status: DC | PRN

## 2022-09-24 MED ORDER — FUROSEMIDE 10 MG/ML IJ SOLN
20.0000 mg | Freq: Once | INTRAMUSCULAR | Status: AC
  Administered 2022-09-24: 20 mg via INTRAVENOUS
  Filled 2022-09-24: qty 2

## 2022-09-24 MED ORDER — DEXMEDETOMIDINE HCL IN NACL 400 MCG/100ML IV SOLN
0.4000 ug/kg/h | INTRAVENOUS | Status: DC
  Administered 2022-09-24: 0.6 ug/kg/h via INTRAVENOUS
  Administered 2022-09-24: 0.4 ug/kg/h via INTRAVENOUS
  Filled 2022-09-24: qty 100

## 2022-09-24 MED ORDER — HYDRALAZINE HCL 20 MG/ML IJ SOLN
10.0000 mg | Freq: Four times a day (QID) | INTRAMUSCULAR | Status: DC | PRN
  Administered 2022-09-24: 10 mg via INTRAVENOUS
  Filled 2022-09-24: qty 1

## 2022-09-24 MED ORDER — SODIUM CHLORIDE 0.9% FLUSH: 3.0000 mL | INTRAVENOUS | Status: DC | PRN

## 2022-09-24 MED ORDER — DEXMEDETOMIDINE HCL IN NACL 400 MCG/100ML IV SOLN
INTRAVENOUS | Status: AC
  Filled 2022-09-24: qty 100

## 2022-09-24 MED ORDER — SODIUM CHLORIDE 0.9 % IV SOLN
250.0000 mL | INTRAVENOUS | Status: DC | PRN
  Administered 2022-09-24: 250 mL via INTRAVENOUS

## 2022-09-24 MED ORDER — DEXMEDETOMIDINE HCL IN NACL 400 MCG/100ML IV SOLN: 0.4000 ug/kg/h | INTRAVENOUS | Status: DC

## 2022-09-24 NOTE — Progress Notes (Deleted)
Pm roudn at beside  Doing biPAP Some mild retlessness + 1 to +2 on RASS ? Confused HR - sinus tachy and the normal hert rate.   Plan   -ok to rest without bipap now for few hours but restart qhs  - start precedex gtt - reintubate if needed    SIGNATURE    Dr. Kalman Shan, M.D., F.C.C.P,  Pulmonary and Critical Care Medicine Staff Physician, [facility] Center Director - Interstitial Lung Disease  Program  Medical Director - [facility] Pulmonary Fibrosis [facility] at [facility] [place], [place], [address]   Pager: [phone], If no answer  -> Check AMION or Try [phone] Telephone (clinical office): [phone] Telephone (research): [phone]  5:58 PM 09/24/2022

## 2022-09-24 NOTE — Progress Notes (Signed)
MD made aware by this RN about pts Audit-C (score of 8) and no new orders given @ this time.

## 2022-09-24 NOTE — Progress Notes (Signed)
   AUDIT_C score high per RN and qualifies for pheniboarb prevention du eto etoh behavior at home But patient not agitated or confused  VBG show s high co2 57 and she has copd on 2L [place]  A COPD patient extubated with her hypercapnia At risk for alcohol withdrawal but not currently withdrawing  Plan -Do postextubation BiPAP for a few hours and then BiPAP nightly -I would not recommend phenobarbital given his COPD and potential hypercapnia -Given the fact she is in the ICU I would prefer that she go on Precedex [nonsedating] if and when she ends up in alcohol withdrawal     SIGNATURE    Dr. Kalman Shan, M.D., F.C.C.P,  Pulmonary and Critical Care Medicine Staff Physician, [facility] Center Director - Interstitial Lung Disease  Program  Medical Director - [facility] Pulmonary Fibrosis [facility] at [facility] [place], [place], [address]   Pager: [phone], If no answer  -> Check AMION or Try [phone] Telephone (clinical office): [phone] Telephone (research): [phone]  3:42 PM 09/24/2022

## 2022-09-24 NOTE — Progress Notes (Signed)
eLink Physician-Brief Progress Note Patient Name: Madeline Jimenez DOB: [birth year] MRN: [id number]   Date of Service  09/24/2022  HPI/Events of Note  Pt has been doing well post-extubation.  She has been saturating 98% on nasal cannula.  She has refused BIPAP tonight.   BP 115/32, HR 49-50s, RR 13, O2 sats 97%.   Last blood gas 7.38/57  eICU Interventions  Bedside swallow evaluation done and patient did well.  Clear liquid diet ordered.  Aspiration precautions.       Intervention Category Intermediate Interventions: Other:  Larinda Buttery 09/24/2022, 11:55 PM

## 2022-09-24 NOTE — Progress Notes (Signed)
RT attempted to place patient back on BIPAP HS but patient is refusing to go on at this time. Patient states she does not want it. Patient is currently on HFNC 8L. Tolerating well.

## 2022-09-24 NOTE — Progress Notes (Signed)
Pt extubated at this time. PO meds will be held until swallow screen can be performed 2-4 hr from extubation time.

## 2022-09-24 NOTE — Progress Notes (Signed)
Attempted to put in a OG tube with no success and PO meds are held until one can be put in place. MD made aware and MD okay w/ this decision.

## 2022-09-24 NOTE — Progress Notes (Signed)
Pt flipping between NSR to NSR tachy. Pt has hx of A-fib RVR. MD notified and order for 2nd EKG.EKG done per order and HR 140s

## 2022-09-24 NOTE — Progress Notes (Signed)
ANTICOAGULATION / ANTIBIOTIC CONSULT NOTE  Pharmacy Consult for heparin + vancomycin Indication: pulmonary embolus + MRSA bacteremia/PNA/abscess  No Known Allergies  Patient Measurements: Height: 5\' 4"  (162.6 cm) Weight: 59.8 kg (131 lb 13.4 oz) IBW/kg (Calculated) : 54.7 Heparin Dosing Weight: 58kg  Vital Signs: Temp: 97.5 F (36.4 C) (12/10 0326) Temp Source: Oral (12/10 0326) BP: 141/47 (12/10 0800) Pulse Rate: 51 (12/10 1008)  Labs: Recent Labs    09/22/22 0303 09/22/22 0312 09/22/22 0701 09/22/22 1014 09/22/22 1849 09/23/22 0753 09/23/22 1108 09/23/22 2158 09/24/22 0639  HGB 9.9* 13.3  --   --   --  10.4*  --   --  9.4*  HCT 31.6* 39.0  --   --   --  32.9*  --   --  29.8*  PLT 256  --   --   --   --  182  --   --  190  HEPARINUNFRC  --   --    < >  --    < >  --  0.24* 0.39 0.46  CREATININE 1.49*  --   --   --   --   --   --   --  1.68*  TROPONINIHS  --   --   --  30*  --  13  --   --   --    < > = values in this interval not displayed.     Estimated Creatinine Clearance: 21.1 mL/min (A) (by C-G formula based on SCr of 1.68 mg/dL (H)).   Assessment: 87 year old female admitted with SOB and shoulder pain. Patient taken to OR 12/7 for I&D of forearm abscess. Pulmonology notes concern for cavitary pneumonia and PE, and Pharmacy consulted for IV heparin dosing.  Heparin level therapeutic; no bleeding reported.   Pharmacy also consulted to dose vancomycin for MRSA bacteremia, cavitary PNA, clavicular/forearm abscess.  S/p forearm I&D.  Patient also has received Covid.  Renal function worsening.  Afebrile, WBC down 18.5, PCT down 4.4.  Vanc 12/7 >> Cefepime 12/7 >> 12/8 CTX/Azith x1 12/6  12/6 BCx - MRSA 12/7 MRSA PCR - positive 12/7 L arm tissue - Staph aureus 12/7 L forearm wound - Staph aureus 12/7 R shoulder tissue #1 -  12/7 R shoulder tissue #2 - Staph aureus 12/7 R shoulder wound - Staph aureus 12/8 BCx - NGTD  Goal of Therapy:  Heparin level  0.3-0.7 units/ml Monitor platelets by anticoagulation protocol: Yes Vanc AUC 400-550   Plan:  Continue heparin infusion at 1400 units/hr Daily heparin level and CBC  Reduce vanc to 1000mg  IV Q48H for AUC 527 using SCr 1.68 Monitor renal fxn, clinical progress, micro data  Nusrat Encarnacion D. 14/8, PharmD, BCPS, BCCCP 09/24/2022, 11:08 AM

## 2022-09-24 NOTE — Procedures (Signed)
Extubation Procedure Note  Patient Details:   Name: Madeline Jimenez DOB: 1935/04/11 MRN: [id number]   Airway Documentation:    Vent end date: 09/24/22 Vent end time: 1000   Evaluation  O2 sats: currently acceptable Complications: No apparent complications Patient did tolerate procedure well. Bilateral Breath Sounds: Rhonchi   Patient extubated per MD order & placed on 4L [place] with SpO2-91%. Increased to 6L SpO2 now 95%. Patient is able to speak & cough at this time. Patient in no distress currently.  Jacqulynn Cadet 09/24/2022, 10:06 AM

## 2022-09-24 NOTE — Progress Notes (Signed)
NAME:  Madeline KocherDeborah Jimenez, MRN:  [id number], DOB:  02-Nov-1934, LOS: 3 ADMISSION DATE:  09/20/2022, CONSULTATION DATE: 12.7 REFERRING MD:  [email or web address] , CHIEF COMPLAINT:  abnormal CT chest    BRIEF  87 year old female with COPD on 2L at home, HTN, HLD, PVD with chief complaint of right shoulder pain and shortness of breath admitted for AHRF and sepsis secondary to pneumonia +/- cellulitis. She had just been discharged from the hospital on 11/30 after being admitted with acute on chronic respiratory failure felt secondary to COVID pneumonia she was started on antiviral therapy and Decadron.  She had reported having ongoing right shoulder pain since the time of discharge. In the ED he was found to be hypotensive and tachycardic cultures were sentshe was started on IV antibiotics for community-acquired pneumonia, but also started on IV vancomycin for concern about cellulitis involving the right shoulder. Cultures positive for MRSA bacteremia from clavicular abscess with incidental PE as well as lung consolidation with areas of cavitation found on CT. Ortho consulted and patient underwent I&D. PCCM consulted for abnormal CT findings. Additional consultation services included infectious disease, and orthopedics, who brought the patient to the operating room for debridement of her right upper extremity cellulitis  She was taken to OR evening of 12/7 and had I&D of left forearm and right shoulder abscesses. Post operatively, she remained on the ventilator and PCCM was called in consultation.  Pertinent  Medical History  COVID just discharged 11/30 COPD HTN HL Hyponatremia  PVD Significant Hospital Events: Including procedures, antibiotic start and stop dates in addition to other pertinent events   12/7 admitted w/ SOB and right shoulder and arm pain. MRSA bacteremia + ID consulted. Ortho consulted for I&D of right shoulder and forarm abscess Pulm consulted for cavitary PNA and PE w/ primary service also  wondering about malignancy. Rt shoulder culture - few GPC in clusters Left arm wound - rare GPC MRSA PCR - POSITIVe 12/7 to OR for I&D of left forearm and right shoulder abscesses - Sedated post op. Getting US guided IV by IV team. Tissue right shoulder 09/21/2022 [on Ancef] Wound right shoulder [on Ancef 09/21/2022] 12/8 - on vent 50% fio2, afebrile, wbc 53k and better. On abx. Positive balance +1.7L. On fent gtt, levophd gtt and heparin gtt (for PE). Folowing commands on fent gtt 12/9 -40% fio2 on vent, On heparin gtt, afebrile.  No new cultures.  On as needed sedation protocol.  Yesterday got apneic after fentanyl but was on pressure support at that time.  Off pressors.  Doppler lower extremity without DVT.  Echocardiogram without any RV strain or elevated pulmonary pressures.  EF 55% grade 1 diastolic dysfunction present. Trop max 30. Nl Lac[place]. BNP 138 and improved uanble to get lab draw today Niece reports red bump in left forearm at time of arrival to home on 09/14/22 at discharge and was warm to touch -> niece upset this was not addressed and feels is source of MRSA - > informed Dr Daiva EvesVan Dam o fID ID rec avoiding PICC/CVL to extent possible  Interim History / Subjective:    12/10--yesterday failed spontaneous breathing trial but today was doing well on spontaneous breathing trial and she has been extubated.  Currently on oxygen.  Off Precedex.  Pulse ox 92% heart rate 51.  Good blood pressure.  She is DNR but spoke to the nurse who seem that short-term intubation for reversible conditions is appropriate.  Therefore changed to partial code.  Objective  Blood pressure (!) 141/47, pulse (!) 51, temperature (!) 97.5 F (36.4 C), temperature source Oral, resp. rate 18, height 5\' 4"  (1.626 m), weight 59.8 kg, SpO2 92 %.    Vent Mode: CPAP;PSV FiO2 (%):  [40 %] 40 % Set Rate:  [15 bmp] 15 bmp Vt Set:  [430 mL] 430 mL PEEP:  [5 cmH20] 5 cmH20 Pressure Support:  [5 cmH20-8 cmH20] 5  cmH20 Plateau Pressure:  [15 cmH20-18 cmH20] 18 cmH20   Intake/Output Summary (Last 24 hours) at 09/24/2022 1059 Last data filed at 09/24/2022 0800 Gross per 24 hour  Intake 1157.66 ml  Output 700 ml  Net 457.66 ml   Filed Weights   09/20/22 2146 09/23/22 0314 09/24/22 0500  Weight: 58 kg 58.1 kg 59.8 kg   Elderly frail lady.  She was on Precedex when I first saw her.  She was able to follow commands.  Doing well on spontaneous breathing trial.  Lungs are clear to auscultation.  And then I extubated her.  Postextubation half an hour later she is without any respiratory distress respiratory rate 15 pulse ox 92% heart rate 51 good blood pressure.  She is alert.         Assessment & Plan:   Hx of COPD  Hx of chronic resp faioure - 2L O2  Curent Acute on chronic respiratory failure (on 2L O2) secondary to MRSA multilobar cavitary pneumonia, superimposed on COPD & further complicated by segmental pulmonary embolus - CT imaging reviewed with bilateral mass-like consolidations with cavitation seen in LUL mass and maybe in LLL mass. Suspect this may be septic emboli. Low on differential but cannot rule out malignancy.   09/24/2022 - >s/p extubation   Plan - definite BIPAP QHS - BD's, nebs - needs followp CT at some point - check abg post extubaion and if CO2 high - >needs post extubation bipap (hold hparin for ABG)   Segmental PE 09/21/22 (Doppler negative for DVT, echo without RV strain near normal troponin and lactic acid]   12/10 - tolerating heparin. No bleed.Heparin level 0.46    Plan - Continue heparin infusion with plans to transition to DOAC.   Shock - multifactorial 2/2 sepsis in setting of L forearm and R shoulder abscesses with MRSA bacteremia (s/p I&D by ortho 12/7) and also likely sedation related.  Doubt PE playing a role. ERsolved 12/8/2e   12/10  - resolved x 36-4h   PLAN - Mean arterial pressure goal greater than 65  SEdation needs on  ventilator  12/10- no delirium.  On preedex  Plan -stop fent prn  -s top versed prn  -stop precedex gtt - monitor  Recent COVID PNA (09/12/22) - diagnosed during last admission 11/28 - 11/30, s/p Molnupiravir and Decadron. MRSA absesccs and sepsis  09/23/22: Afebrile  Plan  - Per ID   AKI.  12/0  - improved and some worse to 1.68. BNP 270. VOl Overload +3.7L. On vanc. On 06-18-1999 saline /h  Plan  - lasix 20mg  IV x 1 - monitor vanc leel with AKI (ID ? Consider chagne) - Follow BMP.   AT risk of hyperglycemia  Plan  - hgba1c - ssi  Physical Deconditioning  Plan  - needs PT - ? SNF rehab  Best Practice (right click and "Reselect all SmartList Selections" daily)  Diet: NPO . Tf stopped post extubaon VTE ppx: Heparin gtt for PE GI ppx: PPI. Code status: DNR but full medical care GU _ foley - > change to purewicl 09/23/22 (  d/w Consulting civil engineer) Dispio: keep in ICU thruogh 09/25/22 to ensure no decomp   Family :  12/8 - called neiece Darl Pikes Reberg [phone] -> LMTCB 09/23/22 called niece Lonni Fix [phone] -> updated over phone       ATTESTATION & SIGNATURE   The patient Madeline Jimenez is critically ill with multiple organ systems failure and requires high complexity decision making for assessment and support, frequent evaluation and titration of therapies, application of advanced monitoring technologies and extensive interpretation of multiple databases and discussion with other appropriate health care personnel such as bedside nurses, social workers, case Production designer, theatre/television/film, consultants, respiratory therapists, nutritionists, secretaries etc.,  Critical care time includes but is not restricted to just documentation time. Documentation can happen in parallel or sequential to care time depending on case mix urgency and priorities for the shift. So, overall critical Care Time devoted to patient care services described in this note is  30 Minutes.   This time reflects time  of care of this signee Dr Kalman Shan which includ does not reflect procedure time, or teaching time or supervisory time of PA/NP/Med student/Med Resident etc but could involve care discussion time     Dr. Kalman Shan, M.D., [facility].C.P Pulmonary and Critical Care Medicine Medical Director - [facility] ICU Staff Physician, [facility] [place] [facility] Pager: [phone], If no answer or between  15:00h - 7:00h: call [phone]  09/24/2022 10:59 AM    LABS    PULMONARY Recent Labs  Lab 09/22/22 0312  PHART 7.407  PCO2ART 45.0  PO2ART 142*  HCO3 28.4*  TCO2 30  O2SAT 99    CBC Recent Labs  Lab 09/22/22 0303 09/22/22 0312 09/23/22 0753 09/24/22 0639  HGB 9.9* 13.3 10.4* 9.4*  HCT 31.6* 39.0 32.9* 29.8*  WBC 53.1*  --  24.4* 18.5*  PLT 256  --  182 190    COAGULATION Recent Labs  Lab 09/20/22 2208  INR 1.6*    CARDIAC  No results for input(s): "TROPONINI" in the last 168 hours. No results for input(s): "PROBNP" in the last 168 hours.   CHEMISTRY Recent Labs  Lab 09/20/22 2208 09/21/22 0200 09/22/22 0303 09/22/22 0312 09/22/22 1704 09/24/22 0639  NA 136 136 140 139  --  147*  K 3.8 3.5 3.9 3.7  --  3.9  CL 92* 99 102  --   --  108  CO2 31 26 28   --   --  27  GLUCOSE 275* 193* 142*  --   --  163*  BUN 44* 37* 37*  --   --  61*  CREATININE 1.62* 1.27* 1.49*  --   --  1.68*  CALCIUM 9.5 8.0* 8.0*  --   --  8.7*  MG  --  1.5* 2.2  --  2.3 2.3  PHOS  --   --   --   --  3.9 4.5   Estimated Creatinine Clearance: 21.1 mL/min (A) (by C-G formula based on SCr of 1.68 mg/dL (H)).   LIVER Recent Labs  Lab 09/20/22 2208 09/24/22 0639  AST 19 25  ALT 30 26  ALKPHOS 97 70  BILITOT 0.5 0.6  PROT 6.5 5.2*  ALBUMIN 2.8* 1.8*  INR 1.6*  --      INFECTIOUS Recent Labs  Lab 09/21/22 0200 09/22/22 1014 09/23/22 0753 09/23/22 1314 09/24/22 0639  LATICACIDVEN 1.6 1.2 1.7  --   --  PROCALCITON  --   12.05  --  5.62 4.47     ENDOCRINE CBG (last 3)  Recent Labs    09/23/22 1935 09/23/22 2308 09/24/22 0322  GLUCAP 99 147* 183*         IMAGING x48h  - image(s) personally visualized  -   highlighted in bold DG Abd Portable 1V  Result Date: 09/24/2022 CLINICAL DATA:  Encounter for NG tube placement. EXAM: PORTABLE ABDOMEN - 1 VIEW COMPARISON:  09/21/2022 FINDINGS: The nasogastric tube in place with tip and side port in the expected location of the gastric antrum. No dilated bowel loops identified. Scoliosis involves the thoracolumbar spine. IMPRESSION: Nasogastric tube tip and side port are in the expected location of the gastric antrum. Electronically Signed   By: Signa Kell M.D.   On: 09/24/2022 08:53   DG CHEST PORT 1 VIEW  Result Date: 09/24/2022 CLINICAL DATA:  Lung infiltrates.  Follow-up exam. EXAM: PORTABLE CHEST 1 VIEW COMPARISON:  09/22/2022 and older studies. FINDINGS: Focal airspace opacities in the upper lobes are unchanged. No new lung abnormalities. No convincing pleural effusion and no pneumothorax. Endotracheal tube is well position. Nasal/orogastric tube has retracted, tip now in the upper thoracic esophagus. IMPRESSION: 1. No change in the bilateral focal airspace lung opacities. No new lung abnormalities. 2. Nasal/orogastric tube has retracted, tip now projecting in the upper thoracic esophagus. Critical Value/emergent results were called by telephone at the time of interpretation on 09/24/2022 at 8:00 am to the charge nurse, who verbally acknowledged these results. Electronically Signed   By: Amie Portland M.D.   On: 09/24/2022 08:00   [place] EKG SITE RITE  Result Date: 09/23/2022 If Site Rite image not attached, placement could not be confirmed due to current cardiac rhythm.

## 2022-09-24 NOTE — Progress Notes (Signed)
Orthopedic Surgery Progress Note   Assessment: Patient is a 87 y.o. female with right shoulder abscess s/p I&D, left forearm abscess s/p I&D   Plan: -Operative plans: complete -Okay for diet and dvt ppx from ortho perspective -Antibiotics: per primary -Iodoform removal and replacement to left forearm daily, gradual iodoform removal from right shoulder daily -Weight bearing status: as tolerated -Follow up intra-op cultures (staph aureus) -PT/OT evaluate and treat -Pain control -Dispo: per primary  ___________________________________________________________________________  Subjective: No acute events overnight. Still intubated in ICU. Being evaluate for possible extubation by ICU staff.   Physical Exam:  General: no acute distress, appears stated age Neurologic: awake, following commands, nods head Respiratory: breathing by vent  MSK:   -Right upper extremity  Dressing c/d/i Fires deltoid, biceps, triceps, wrist extensors, wrist flexors, finger extensors, finger flexors AIN/PIN/IO intact  Sensation intact to light touch in median/ulnar/radial/axillary nerve distributions  Hand warm and well perfused, palpable radial pulse  No pain with internal and external rotation at shoulder  -Left upper extremity  Dressing c/d/i Fires deltoid, biceps, triceps, wrist extensors, wrist flexors, finger extensors, finger flexors AIN/PIN/IO intact  Sensation intact to light touch in median/ulnar/radial/axillary nerve distributions  Hand warm and well perfused, palpable radial pulse  Patient name: Madeline Jimenez Patient MRN: [id number] Date: 09/24/22

## 2022-09-24 NOTE — Progress Notes (Signed)
        Date: 09/24/2022  Patient name: Madeline Jimenez  Medical record number: [id number]  Date of birth: Apr 01, 1935   I was notified yesterday re PICC Line RN re placement of PICC line in this pt who had not cleared her bacteremia yet  Unfortunately it was becoming impossible to get blood work and critical care desired placement of a central line.  I recommended getting blood cultures before that is done.  I also was notified by Dr. Marchelle Gearing patient apparently during her last hospitalization appeared to have an infiltrated peripheral IV that the family in particular the niece had noticed.  It would make more sense to me that she did develop an MRSA bacteremia due to a septic phlebitis that subsequently seeded her left forearm and right shoulder.  Fortunately the blood culture taken yesterday so far without growth.  Dr. Luciana Axe will be back tomorrow.   Acey Lav 09/24/2022, 12:49 PM

## 2022-09-24 NOTE — Progress Notes (Addendum)
eLink Physician-Brief Progress Note Patient Name: Madeline Jimenez DOB: May 09, 1935 MRN: [id number]   Date of Service  09/24/2022  HPI/Events of Note  Pt with vomiting episodes.  TF held, pt suctioned.  No fever, desaturations noted.   eICU Interventions  Will check CXR PRN zofran ordered (Qtc 436) Will continue to monitor closely.         Chesnee Floren M DELA CRUZ 09/24/2022, 6:19 AM

## 2022-09-24 NOTE — Progress Notes (Signed)
ANTICOAGULATION CONSULT NOTE - Follow Up Consult  Pharmacy Consult for heparin Indication: pulmonary embolus  Labs: Recent Labs    09/21/22 0200 09/22/22 0303 09/22/22 0312 09/22/22 0701 09/22/22 1014 09/22/22 1849 09/23/22 0753 09/23/22 1108 09/23/22 2158  HGB 10.8* 9.9* 13.3  --   --   --  10.4*  --   --   HCT 32.9* 31.6* 39.0  --   --   --  32.9*  --   --   PLT 254 256  --   --   --   --  182  --   --   HEPARINUNFRC  --   --   --    < >  --  <0.10*  --  0.24* 0.39  CREATININE 1.27* 1.49*  --   --   --   --   --   --   --   TROPONINIHS  --   --   --   --  30*  --  13  --   --    < > = values in this interval not displayed.    Assessment: 87yo female therapeutic on heparin after rate change though at low end of goal; no infusion issues or signs of bleeding per RN.  Goal of Therapy:  Heparin level 0.3-0.7 units/ml   Plan:  Will increase heparin infusion slightly to 1400 units/hr to help prevent dropping below goal and check level with am labs.    Vernard Gambles, PharmD, BCPS  09/24/2022,12:06 AM

## 2022-09-25 DIAGNOSIS — N179 Acute kidney failure, unspecified: Secondary | ICD-10-CM

## 2022-09-25 DIAGNOSIS — J189 Pneumonia, unspecified organism: Secondary | ICD-10-CM

## 2022-09-25 LAB — BASIC METABOLIC PANEL
Anion gap: 12 (ref 5–15)
BUN: 55 mg/dL — ABNORMAL HIGH (ref 8–23)
CO2: 26 mmol/L (ref 22–32)
Calcium: 8.7 mg/dL — ABNORMAL LOW (ref 8.9–10.3)
Chloride: 98 mmol/L (ref 98–111)
Creatinine, Ser: 1.28 mg/dL — ABNORMAL HIGH (ref 0.44–1.00)
GFR, Estimated: 41 mL/min — ABNORMAL LOW (ref >60)
Glucose, Bld: 170 mg/dL — ABNORMAL HIGH (ref 70–99)
Potassium: 3.3 mmol/L — ABNORMAL LOW (ref 3.5–5.1)
Sodium: 136 mmol/L (ref 135–145)

## 2022-09-25 LAB — GLUCOSE, CAPILLARY
Glucose-Capillary: 128 mg/dL — ABNORMAL HIGH (ref 70–99)
Glucose-Capillary: 144 mg/dL — ABNORMAL HIGH (ref 70–99)
Glucose-Capillary: 144 mg/dL — ABNORMAL HIGH (ref 70–99)
Glucose-Capillary: 153 mg/dL — ABNORMAL HIGH (ref 70–99)
Glucose-Capillary: 94 mg/dL (ref 70–99)
Glucose-Capillary: 85 mg/dL (ref 70–99)
Glucose-Capillary: 153 mg/dL — ABNORMAL HIGH (ref 70–99)
Glucose-Capillary: 163 mg/dL — ABNORMAL HIGH (ref 70–99)
Glucose-Capillary: 156 mg/dL — ABNORMAL HIGH (ref 70–99)

## 2022-09-25 LAB — CBC
HCT: 30 % — ABNORMAL LOW (ref 36.0–46.0)
Hemoglobin: 9.6 g/dL — ABNORMAL LOW (ref 12.0–15.0)
MCH: 30.2 pg (ref 26.0–34.0)
MCHC: 32 g/dL (ref 30.0–36.0)
MCV: 94.3 fL (ref 80.0–100.0)
Platelets: 170 10*3/uL (ref 150–400)
RBC: 3.18 MIL/uL — ABNORMAL LOW (ref 3.87–5.11)
RDW: 14.7 % (ref 11.5–15.5)
WBC: 22.2 10*3/uL — ABNORMAL HIGH (ref 4.0–10.5)
nRBC: 0 % (ref 0.0–0.2)

## 2022-09-25 LAB — HEMOGLOBIN A1C
Hgb A1c MFr Bld: 6 % — ABNORMAL HIGH (ref 4.8–5.6)
Hgb A1c MFr Bld: 6.1 % — ABNORMAL HIGH (ref 4.8–5.6)
Mean Plasma Glucose: 126 mg/dL
Mean Plasma Glucose: 128 mg/dL

## 2022-09-25 LAB — HEPARIN LEVEL (UNFRACTIONATED): Heparin Unfractionated: 0.46 IU/mL (ref 0.30–0.70)

## 2022-09-25 MED ORDER — ACETAMINOPHEN 160 MG/5ML PO SOLN
650.0000 mg | Freq: Four times a day (QID) | ORAL | Status: DC
  Administered 2022-09-25 – 2022-09-26 (×4): 650 mg via ORAL
  Filled 2022-09-25 (×4): qty 20.3

## 2022-09-25 MED ORDER — APIXABAN 5 MG PO TABS
5.0000 mg | ORAL_TABLET | Freq: Two times a day (BID) | ORAL | Status: DC
  Administered 2022-10-02 – 2022-10-03 (×3): 5 mg via ORAL
  Filled 2022-09-25 (×3): qty 1

## 2022-09-25 MED ORDER — FREE WATER: 200.0000 mL | Freq: Four times a day (QID) | Status: DC

## 2022-09-25 MED ORDER — POLYETHYLENE GLYCOL 3350 17 G PO PACK
17.0000 g | PACK | Freq: Every day | ORAL | Status: DC
  Administered 2022-09-25 – 2022-10-02 (×6): 17 g via ORAL
  Filled 2022-09-25 (×8): qty 1

## 2022-09-25 MED ORDER — FENTANYL CITRATE PF 50 MCG/ML IJ SOSY: 12.5000 ug | PREFILLED_SYRINGE | INTRAMUSCULAR | Status: DC | PRN

## 2022-09-25 MED ORDER — FOOD THICKENER (SIMPLYTHICK): 5.0000 | ORAL | Status: DC | PRN

## 2022-09-25 MED ORDER — THIAMINE MONONITRATE 100 MG PO TABS
100.0000 mg | ORAL_TABLET | Freq: Every day | ORAL | Status: DC
  Administered 2022-09-25 – 2022-10-03 (×9): 100 mg via ORAL
  Filled 2022-09-25 (×9): qty 1

## 2022-09-25 MED ORDER — VANCOMYCIN HCL IN DEXTROSE 1-5 GM/200ML-% IV SOLN: 1000.0000 mg | INTRAVENOUS | Status: DC

## 2022-09-25 MED ORDER — POTASSIUM CHLORIDE CRYS ER 20 MEQ PO TBCR
40.0000 meq | EXTENDED_RELEASE_TABLET | Freq: Once | ORAL | Status: AC
  Administered 2022-09-25: 40 meq via ORAL
  Filled 2022-09-25: qty 2

## 2022-09-25 MED ORDER — ORAL CARE MOUTH RINSE: 15.0000 mL | OROMUCOSAL | Status: DC | PRN

## 2022-09-25 MED ORDER — ESCITALOPRAM OXALATE 10 MG PO TABS
10.0000 mg | ORAL_TABLET | Freq: Every day | ORAL | Status: DC
  Administered 2022-09-25 – 2022-10-03 (×9): 10 mg via ORAL
  Filled 2022-09-25 (×9): qty 1

## 2022-09-25 MED ORDER — SENNOSIDES-DOCUSATE SODIUM 8.6-50 MG PO TABS
1.0000 | ORAL_TABLET | Freq: Every day | ORAL | Status: DC
  Administered 2022-09-25 – 2022-10-03 (×8): 1 via ORAL
  Filled 2022-09-25 (×9): qty 1

## 2022-09-25 MED ORDER — APIXABAN 5 MG PO TABS
10.0000 mg | ORAL_TABLET | Freq: Two times a day (BID) | ORAL | Status: AC
  Administered 2022-09-25 – 2022-10-01 (×13): 10 mg via ORAL
  Filled 2022-09-25 (×14): qty 2

## 2022-09-25 MED ORDER — FENTANYL CITRATE PF 50 MCG/ML IJ SOSY
12.5000 ug | PREFILLED_SYRINGE | Freq: Once | INTRAMUSCULAR | Status: AC
  Administered 2022-09-25: 12.5 ug via INTRAVENOUS
  Filled 2022-09-25: qty 1

## 2022-09-25 NOTE — Progress Notes (Addendum)
NAME:  Madeline Jimenez, MRN:  [id number], DOB:  12/09/1934, LOS: 4 ADMISSION DATE:  09/20/2022, CONSULTATION DATE: 12.7 REFERRING MD:  [email or web address] , CHIEF COMPLAINT:  abnormal CT chest    BRIEF  87 year old female with COPD on 2L at home, HTN, HLD, PVD with chief complaint of right shoulder pain and shortness of breath admitted for AHRF and sepsis secondary to pneumonia +/- cellulitis. She had just been discharged from the hospital on 11/30 after being admitted with acute on chronic respiratory failure felt secondary to COVID pneumonia she was started on antiviral therapy and Decadron.  She had reported having ongoing right shoulder pain since the time of discharge. In the ED he was found to be hypotensive and tachycardic cultures were sentshe was started on IV antibiotics for community-acquired pneumonia, but also started on IV vancomycin for concern about cellulitis involving the right shoulder. Cultures positive for MRSA bacteremia from clavicular abscess with incidental PE as well as lung consolidation with areas of cavitation found on CT. Ortho consulted and patient underwent I&D. PCCM consulted for abnormal CT findings. Additional consultation services included infectious disease, and orthopedics, who brought the patient to the operating room for debridement of her right upper extremity cellulitis  She was taken to OR evening of 12/7 and had I&D of left forearm and right shoulder abscesses. Post operatively, she remained on the ventilator and PCCM was called in consultation.  Pertinent  Medical History  COVID just discharged 11/30 COPD HTN HL Hyponatremia  PVD Significant Hospital Events: Including procedures, antibiotic start and stop dates in addition to other pertinent events   12/7 admitted w/ SOB and right shoulder and arm pain. MRSA bacteremia + ID consulted. Ortho consulted for I&D of right shoulder and forarm abscess Pulm consulted for cavitary PNA and PE w/ primary service also  wondering about malignancy. Rt shoulder culture - few GPC in clusters Left arm wound - rare GPC MRSA PCR - POSITIVe 12/7 to OR for I&D of left forearm and right shoulder abscesses - Sedated post op. Getting US guided IV by IV team. Tissue right shoulder 09/21/2022 [on Ancef] Wound right shoulder [on Ancef 09/21/2022] 12/8 - on vent 50% fio2, afebrile, wbc 53k and better. On abx. Positive balance +1.7L. On fent gtt, levophd gtt and heparin gtt (for PE). Folowing commands on fent gtt 12/9 -40% fio2 on vent, On heparin gtt, afebrile.  No new cultures.  On as needed sedation protocol.  Yesterday got apneic after fentanyl but was on pressure support at that time.  Off pressors.  Doppler lower extremity without DVT.  Echocardiogram without any RV strain or elevated pulmonary pressures.  EF 55% grade 1 diastolic dysfunction present. Trop max 30. Nl [place]. BNP 138 and improved uanble to get lab draw today Niece reports red bump in left forearm at time of arrival to home on 09/14/22 at discharge and was warm to touch -> niece upset this was not addressed and feels is source of MRSA - > informed Dr Daiva Eves o fID ID rec avoiding PICC/CVL to extent possible  Interim History / Subjective:   Extubated yesterday, currently on HFNC at 6L [place], saturating well. Does have a worsening leukocytosis, but afebrile overnight. Hemoglobin stable from yesterday. Did pass bedside swallow, started on CLD. Precedex switched off this AM, anticipate will improve bradycardia.   Objective   Blood pressure 136/60, pulse (!) 51, temperature 97.9 F (36.6 C), temperature source Axillary, resp. rate 19, height 5\' 4"  (1.626 m), weight 59.8  kg, SpO2 98 %.    Vent Mode: BIPAP;PCV FiO2 (%):  [60 %] 60 % Set Rate:  [8 bmp-10 bmp] 10 bmp PEEP:  [5 cmH20-6 cmH20] 6 cmH20   Intake/Output Summary (Last 24 hours) at 09/25/2022 0956 Last data filed at 09/25/2022 0900 Gross per 24 hour  Intake 1517.67 ml  Output 1650 ml  Net -132.33  ml   Filed Weights   09/20/22 2146 09/23/22 0314 09/24/22 0500  Weight: 58 kg 58.1 kg 59.8 kg   Physical Examination: General: elderly frail female, sitting up in bed, NAD. HENT: [place]/AT, dry mucus membranes. Eyes: PERRL, EOMI. CV: bradycardic rate with regular rhythm, no obvious murmurs. Pulm: CTABL, no adventitious sounds noted. Saturating well on 6L HFNC. Abdomen: soft, nondistended, nontender, normoactive bowel sounds. Extremities: warm and well perfused, no peripheral edema. Neuro: hard of hearing, oriented to self and location. Able to follow simple commands. No focal deficits noted.   Assessment & Plan:   Sepsis 2/2 MRSA bacteremia MRSA multilobar cavitary PNA L forearm abscess s/p I&D (12/7) R shoulder abscess s/p I&D (12/7) Thought to be secondary to a septic phlebitis that subsequently seeded L forearm and R shoulder. She is s/p I&D of L forearm abscess and R shoulder abscess (12/7, Dr. Christell Constant). Blood cultures 12/6 revealed MRSA. Repeat blood cultures 12/9 with no growth for past 2 days. Per ID, will need 6 weeks of treatment for presumed endocarditis with vancomycin. -appreciate ID assistance -continue vancomycin -discussed with cardiology, TEE on 12/15 to r/o endocarditis  Acute on chronic respiratory failure MRSA multilobar cavitary PNA Segmental PE Hx of chronic respiratory failure 2/2 COPD (on 2L O2 at baseline) Extubated 09/24/22. She is improving from a respiratory standpoint, saturating well on 6L HFNC. CT imaging revealed a segmental PE in the posterior RUL along with bilateral opacities with areas of cavitation. Opacities could represent MRSA cavitary PNA and/or septic pulmonary emboli. Of note, dopplers negative for DVT and TTE without RV strain. -continue vanc -transitioning to eliquis BID -continue brovana, pulmicort, atrovent -off of precedex -will need repeat CT chest in 2-3 months or sooner if needed  AKI Baseline Cr ~0.9 - 1.1. On admission, Cr 1.62.  Did have improvement initially, but has worsened over the past couple of days. Per I/O's, she is volume up ~3L since admission, although likely was initially significantly volume depleted in the setting of septic shock.   -trend kidney function -avoid nephrotoxic meds -strict I/Os  COVID PNA - present on admit Diagnosed during last admission (11/28). She is s/p molnupiravir and decadron therapies.  -supportive care -isolation for 21 days since positive result (last day 12/19)  Prediabetes A1c 6.1%. CBGs at goal. -sensitive SSI -trend CBGs  Physical Deconditioning Will need extensive PT for strength training.  -PT recommending SNF    Best Practice (right click and "Reselect all SmartList Selections" daily)  Diet: dysphagia 2/nectar thick VTE ppx: Eliquis GI ppx: N/A CODE status: PARTIAL (no CPR or ACLS meds; short-term intubation is okay) Foley: no Disposition: transfer to medical floor within the next day   ATTESTATION & Lars Pinks, MD 09/25/2022, 9:57 AM   LABS    PULMONARY Recent Labs  Lab 09/22/22 0312 09/24/22 1452  PHART 7.407  --   PCO2ART 45.0  --   PO2ART 142*  --   HCO3 28.4* 33.7*  TCO2 30  --   O2SAT 99 49.4    CBC Recent Labs  Lab 09/23/22 0753 09/24/22 0639 09/25/22 0147  HGB 10.4*  9.4* 9.6*  HCT 32.9* 29.8* 30.0*  WBC 24.4* 18.5* 22.2*  PLT 182 190 170    COAGULATION Recent Labs  Lab 09/20/22 2208  INR 1.6*    CHEMISTRY Recent Labs  Lab 09/20/22 2208 09/21/22 0200 09/22/22 0303 09/22/22 0312 09/22/22 1704 09/24/22 0639  NA 136 136 140 139  --  147*  K 3.8 3.5 3.9 3.7  --  3.9  CL 92* 99 102  --   --  108  CO2 31 26 28   --   --  27  GLUCOSE 275* 193* 142*  --   --  163*  BUN 44* 37* 37*  --   --  61*  CREATININE 1.62* 1.27* 1.49*  --   --  1.68*  CALCIUM 9.5 8.0* 8.0*  --   --  8.7*  MG  --  1.5* 2.2  --  2.3 2.3  PHOS  --   --   --   --  3.9 4.5   Estimated Creatinine Clearance: 21.1 mL/min (A)  (by C-G formula based on SCr of 1.68 mg/dL (H)).   LIVER Recent Labs  Lab 09/20/22 2208 09/24/22 0639  AST 19 25  ALT 30 26  ALKPHOS 97 70  BILITOT 0.5 0.6  PROT 6.5 5.2*  ALBUMIN 2.8* 1.8*  INR 1.6*  --      IMAGING x48h  - image(s) personally visualized  -   highlighted in bold DG Abd Portable 1V  Result Date: 09/24/2022 CLINICAL DATA:  Encounter for NG tube placement. EXAM: PORTABLE ABDOMEN - 1 VIEW COMPARISON:  09/21/2022 FINDINGS: The nasogastric tube in place with tip and side port in the expected location of the gastric antrum. No dilated bowel loops identified. Scoliosis involves the thoracolumbar spine. IMPRESSION: Nasogastric tube tip and side port are in the expected location of the gastric antrum. Electronically Signed   By: 14/04/2022 M.D.   On: 09/24/2022 08:53   DG CHEST PORT 1 VIEW  Result Date: 09/24/2022 CLINICAL DATA:  Lung infiltrates.  Follow-up exam. EXAM: PORTABLE CHEST 1 VIEW COMPARISON:  09/22/2022 and older studies. FINDINGS: Focal airspace opacities in the upper lobes are unchanged. No new lung abnormalities. No convincing pleural effusion and no pneumothorax. Endotracheal tube is well position. Nasal/orogastric tube has retracted, tip now in the upper thoracic esophagus. IMPRESSION: 1. No change in the bilateral focal airspace lung opacities. No new lung abnormalities. 2. Nasal/orogastric tube has retracted, tip now projecting in the upper thoracic esophagus. Critical Value/emergent results were called by telephone at the time of interpretation on 09/24/2022 at 8:00 am to the charge nurse, who verbally acknowledged these results. Electronically Signed   By: 14/07/2022 M.D.   On: 09/24/2022 08:00

## 2022-09-25 NOTE — Evaluation (Signed)
Physical Therapy Evaluation Patient Details Name: Madeline Jimenez MRN: [id number] DOB: 1935-04-06 Today's Date: 09/25/2022  History of Present Illness  87 yo female presents to [facility] on 12/6 with ShOB and R shoulder pain, ShOB secondary to PNA from recent covid infection (hospitalized 11/28-11/30). Cultures positive for MRSA bacteremia from clavicular abscess with incidental PE as well as lung consolidation with areas of cavitation found on CT.S/p I&D R shoulder abscess, L forearm on 12/7. ETT 12/7-12/10. PMH includes COPD, HTN, HLD, PVD.  Clinical Impression   Pt presents with generalized weakness, impaired respiratory status with increased O2 demands, decreased activity tolerance, and impaired balance. Pt to benefit from acute PT to address deficits. Pt tolerated transfer-level mobility only at this time, overall requiring mod +2 assist. PT recommending ST-SNF level of care post-acutely, hopeful pt will progress to home level. PT to progress mobility as tolerated, and will continue to follow acutely.         Recommendations for follow up therapy are one component of a multi-disciplinary discharge planning process, led by the attending physician.  Recommendations may be updated based on patient status, additional functional criteria and insurance authorization.  Follow Up Recommendations Skilled nursing-short term rehab (<3 hours/day)      Assistance Recommended at Discharge Intermittent Supervision/Assistance  Patient can return home with the following  A lot of help with walking and/or transfers;A lot of help with bathing/dressing/bathroom    Equipment Recommendations None recommended by PT  Recommendations for Other Services       Functional Status Assessment Patient has had a recent decline in their functional status and demonstrates the ability to make significant improvements in function in a reasonable and predictable amount of time.     Precautions / Restrictions  Precautions Precautions: Fall Precaution Comments: 4LO2 Restrictions Weight Bearing Restrictions: No      Mobility  Bed Mobility Overal bed mobility: Needs Assistance Bed Mobility: Supine to Sit     Supine to sit: Mod assist     General bed mobility comments: assist for trunk and LE management, scooting forward towards EOB.    Transfers Overall transfer level: Needs assistance Equipment used: 2 person hand held assist Transfers: Sit to/from Stand, Bed to chair/wheelchair/BSC Sit to Stand: Mod assist, +2 safety/equipment Stand pivot transfers: Mod assist, +2 physical assistance         General transfer comment: assist for power up, rise, correcting posterior bias, and stepping to recliner.    Ambulation/Gait                  Stairs            Wheelchair Mobility    Modified Rankin (Stroke Patients Only)       Balance Overall balance assessment: Needs assistance Sitting-balance support: No upper extremity supported, Feet supported Sitting balance-Leahy Scale: Fair     Standing balance support: Bilateral upper extremity supported, During functional activity, Reliant on assistive device for balance Standing balance-Leahy Scale: Poor Standing balance comment: reliant on external assist                             Pertinent Vitals/Pain Pain Assessment Pain Assessment: Faces Faces Pain Scale: No hurt Pain Intervention(s): Monitored during session    Home Living Family/patient expects to be discharged to:: Private residence Living Arrangements: Children;Other relatives Available Help at Discharge: Available 24 hours/day;Family Type of Home: House Home Access: Stairs to enter Entrance Stairs-Rails: None Entrance Stairs-Number of Steps:  2   Home Layout: One level Home Equipment: Agricultural consultant (2 wheels);Grab bars - tub/shower      Prior Function Prior Level of Function : Needs assist             Mobility Comments: pt  reports using RW for gait, 2LO2 chronically (O2 requirements found in chart review) ADLs Comments: Pt reports her children do meal prep and cleaning, is indep with other ADLs     Hand Dominance   Dominant Hand: Right    Extremity/Trunk Assessment   Upper Extremity Assessment Upper Extremity Assessment: Defer to OT evaluation    Lower Extremity Assessment Lower Extremity Assessment: Generalized weakness    Cervical / Trunk Assessment Cervical / Trunk Assessment: Kyphotic  Communication   Communication: HOH  Cognition Arousal/Alertness: Awake/alert Behavior During Therapy: WFL for tasks assessed/performed Overall Cognitive Status: Within Functional Limits for tasks assessed                                          General Comments General comments (skin integrity, edema, etc.): 4LO2, spO2 86-91% during mobility. HR ranging from 56-115 bpm during session, RN aware    Exercises     Assessment/Plan    PT Assessment Patient needs continued PT services  PT Problem List Decreased strength;Decreased mobility;Decreased range of motion;Decreased activity tolerance;Decreased balance;Decreased knowledge of use of DME;Pain;Decreased safety awareness       PT Treatment Interventions DME instruction;Therapeutic activities;Gait training;Therapeutic exercise;Patient/family education;Balance training;Stair training;Functional mobility training    PT Goals (Current goals can be found in the Care Plan section)  Acute Rehab PT Goals Patient Stated Goal: home PT Goal Formulation: With patient Time For Goal Achievement: 10/09/22 Potential to Achieve Goals: Good    Frequency Min 3X/week     Co-evaluation               AM-PAC PT "6 Clicks" Mobility  Outcome Measure Help needed turning from your back to your side while in a flat bed without using bedrails?: A Little Help needed moving from lying on your back to sitting on the side of a flat bed without using  bedrails?: A Little Help needed moving to and from a bed to a chair (including a wheelchair)?: A Lot Help needed standing up from a chair using your arms (e.g., wheelchair or bedside chair)?: A Lot Help needed to walk in hospital room?: A Lot Help needed climbing 3-5 steps with a railing? : Total 6 Click Score: 13    End of Session Equipment Utilized During Treatment: Oxygen Activity Tolerance: Patient tolerated treatment well Patient left: with call bell/phone within reach;in bed;with bed alarm set Nurse Communication: Mobility status PT Visit Diagnosis: Other abnormalities of gait and mobility (R26.89);Muscle weakness (generalized) (M62.81)    Time: 4627-0350 PT Time Calculation (min) (ACUTE ONLY): 22 min   Charges:   PT Evaluation $PT Eval Low Complexity: 1 Low         Deadrian Toya S, PT DPT Acute Rehabilitation Services Pager [phone]  Office [phone]   Tyrone Apple E Christain [place] 09/25/2022, 2:48 PM

## 2022-09-25 NOTE — Progress Notes (Signed)
eLink Physician-Brief Progress Note Patient Name: Madeline Jimenez DOB: 01-22-1935 MRN: [id number]   Date of Service  09/25/2022  HPI/Events of Note  Hypokalemia - K+ = 3.3 and Creatinine = 1.28.  eICU Interventions  Will replace K+.      Intervention Category Major Interventions: Electrolyte abnormality - evaluation and management  Lacie Landry [place] 09/25/2022, 7:44 PM

## 2022-09-25 NOTE — Progress Notes (Signed)
Pt has PRN Bipap orders, no distress noted at this time. Pt on 4L HFNC

## 2022-09-25 NOTE — Progress Notes (Signed)
Nutrition Follow-up  DOCUMENTATION CODES:  Severe malnutrition in context of chronic illness  INTERVENTION:  Continue diet as ordered per SLP, DYS 2 with nectar thick liquids Recommend PO bowel regimen, discussed with MD and RPH Nectar Thick Mighty Shake BID, each supplement provides 330kcal and 9g protein  Magic cup TID with meals, each supplement provides 290 kcal and 9 grams of protein  NUTRITION DIAGNOSIS:  Severe Malnutrition related to chronic illness (COPD) as evidenced by severe muscle depletion, severe fat depletion. - remains applicable  GOAL:  Patient will meet greater than or equal to 90% of their needs - progressing  MONITOR:  Vent status, Labs, TF tolerance, I & O's  REASON FOR ASSESSMENT:  Consult Enteral/tube feeding initiation and management  ASSESSMENT:  Pt with hx of COPD, HTN, HLD, PVD and CKD presented to ED with SOB and shoulder pain after recent admission and COVID19 dx.   12/7 - Op, I&D of left forearm and right shoulder. Returned to ICU intubated 12/8 - TF initiated 12/10 - extubated  Pt resting in bed at the time of assessment. Just finished working with SLP and is now on a PO diet with thickened liquids. Will add supplements to augment intake and evaluate need for more aggressive nutrition interventions if PO is inadequate to meet needs.   Noted no BM still and bowel regimen dc with loss of access from extubation. Discussed in rounds, will be restarted.    Intake/Output Summary (Last 24 hours) at 09/25/2022 1145 Last data filed at 09/25/2022 1100 Gross per 24 hour  Intake 1525.1 ml  Output 1650 ml  Net -124.9 ml  Net IO Since Admission: 3,639.95 mL [09/25/22 1145]  Nutritionally Relevant Medications: Scheduled Meds:  insulin aspart  0-9 Units Subcutaneous Q4H   pantoprazole IV  40 mg Intravenous Daily   thiamine  100 mg Oral Daily   PRN Meds: ondansetron  Labs Reviewed: Sodium 147 BUN 61, creatinine 1.68 CBG ranges from 85-153 mg/dL  over the last 24 hours HgbA1c 6.1% (12/10)  NUTRITION - FOCUSED PHYSICAL EXAM: Flowsheet Row Most Recent Value  Orbital Region Severe depletion  Upper Arm Region Severe depletion  Thoracic and Lumbar Region Moderate depletion  Buccal Region Severe depletion  Temple Region Severe depletion  Clavicle Bone Region Mild depletion  Clavicle and Acromion Bone Region Severe depletion  Scapular Bone Region Unable to assess  Dorsal Hand Unable to assess  [mittens]  Patellar Region Severe depletion  Anterior Thigh Region Severe depletion  Posterior Calf Region Severe depletion  Edema (RD Assessment) None  Hair Reviewed  Eyes Reviewed  Mouth Unable to assess  Skin Reviewed  Nails Unable to assess    Diet Order:   Diet Order             DIET DYS 2 Room service appropriate? No; Fluid consistency: Nectar Thick  Diet effective now                   EDUCATION NEEDS:  Not appropriate for education at this time  Skin:  Skin Assessment: Reviewed RN Assessment  Last BM:  prior to admission  Height:  Ht Readings from Last 1 Encounters:  09/20/22 5\' 4"  (1.626 m)    Weight:  Wt Readings from Last 1 Encounters:  09/24/22 59.8 kg    Ideal Body Weight:  54.5 kg  BMI:  Body mass index is 22.63 kg/m.  Estimated Nutritional Needs:  Kcal:  1500-1700 kcal/d Protein:  75-90g/d Fluid:  >/=1.5L/d  Ranell Patrick, RD, LDN Clinical Dietitian RD pager # available in [place]  After hours/weekend pager # available in [facility]

## 2022-09-25 NOTE — Progress Notes (Signed)
eLink Physician-Brief Progress Note Patient Name: Madeline Jimenez DOB: [birth year] MRN: [id number]   Date of Service  09/25/2022  HPI/Events of Note  Patient c/o pain related to I/D  eICU Interventions  Plan: Fentanyl 12.5 mcg IV now.     Intervention Category Major Interventions: Other:  Lenell Antu 09/25/2022, 9:22 PM

## 2022-09-25 NOTE — Progress Notes (Signed)
[facility]   Reason for visit: Follow up on bacteremia  Interval History: extubated yesterday and on supplemental oxygen by nasal cannula only. She is asking for water.  No concerns.  WBC 22.2 , down from 57.5 initially.  AFebrile.     Physical Exam: Constitutional:  Vitals:   09/25/22 1100 09/25/22 1200  BP: (!) 144/85 (!) 153/62  Pulse: (!) 58 (!) 116  Resp: 17 20  Temp: 98.9 F (37.2 C)   SpO2: 91% 94%   patient appears in NAD HENT: +nasal cannula Respiratory: Normal respiratory effort  Review of Systems: Constitutional: negative for fevers and chills  Lab Results  Component Value Date   WBC 22.2 (H) 09/25/2022   HGB 9.6 (L) 09/25/2022   HCT 30.0 (L) 09/25/2022   MCV 94.3 09/25/2022   PLT 170 09/25/2022    Lab Results  Component Value Date   CREATININE 1.68 (H) 09/24/2022   BUN 61 (H) 09/24/2022   NA 147 (H) 09/24/2022   K 3.9 09/24/2022   CL 108 09/24/2022   CO2 27 09/24/2022    Lab Results  Component Value Date   ALT 26 09/24/2022   AST 25 09/24/2022   ALKPHOS 70 09/24/2022     Microbiology: Recent Results (from the past 240 hour(s))  Blood Culture (routine x 2)     Status: Abnormal   Collection Time: 09/20/22 10:08 PM   Specimen: BLOOD  Result Value Ref Range Status   Specimen Description BLOOD RIGHT ANTECUBITAL  Final   Special Requests   Final    BOTTLES DRAWN AEROBIC AND ANAEROBIC Blood Culture results may not be optimal due to an excessive volume of blood received in culture bottles   Culture  Setup Time   Final    GRAM POSITIVE COCCI IN CLUSTERS IN BOTH AEROBIC AND ANAEROBIC BOTTLES CRITICAL RESULT CALLED TO, READ BACK BY AND VERIFIED WITH: PHARMD E MARTIN [phone] AT 1452 BY CM Performed at [facility], [address] [place], [place] [id number]    Culture METHICILLIN RESISTANT STAPHYLOCOCCUS AUREUS (A)  Final   Report Status 09/23/2022 FINAL  Final   Organism ID, Bacteria METHICILLIN RESISTANT  STAPHYLOCOCCUS AUREUS  Final      Susceptibility   Methicillin resistant staphylococcus aureus - MIC*    CIPROFLOXACIN <=0.5 SENSITIVE Sensitive     ERYTHROMYCIN >=8 RESISTANT Resistant     GENTAMICIN <=0.5 SENSITIVE Sensitive     OXACILLIN >=4 RESISTANT Resistant     TETRACYCLINE <=1 SENSITIVE Sensitive     VANCOMYCIN 1 SENSITIVE Sensitive     TRIMETH/SULFA <=10 SENSITIVE Sensitive     CLINDAMYCIN <=0.25 SENSITIVE Sensitive     RIFAMPIN <=0.5 SENSITIVE Sensitive     Inducible Clindamycin NEGATIVE Sensitive     * METHICILLIN RESISTANT STAPHYLOCOCCUS AUREUS  Blood Culture ID Panel (Reflexed)     Status: Abnormal   Collection Time: 09/20/22 10:08 PM  Result Value Ref Range Status   Enterococcus faecalis NOT DETECTED NOT DETECTED Final   Enterococcus Faecium NOT DETECTED NOT DETECTED Final   Listeria monocytogenes NOT DETECTED NOT DETECTED Final   Staphylococcus species DETECTED (A) NOT DETECTED Final    Comment: CRITICAL RESULT CALLED TO, READ BACK BY AND VERIFIED WITH: PHARMD E MARTIN [phone] AT 1452 BY CM    Staphylococcus aureus (BCID) DETECTED (A) NOT DETECTED Final    Comment: Methicillin (oxacillin)-resistant Staphylococcus aureus (MRSA). MRSA is predictably resistant to beta-lactam antibiotics (except ceftaroline). Preferred therapy is vancomycin unless  clinically contraindicated. Patient requires contact precautions if  hospitalized. CRITICAL RESULT CALLED TO, READ BACK BY AND VERIFIED WITH: PHARMD E MARTIN [id number] AT 1452 BY CM    Staphylococcus epidermidis NOT DETECTED NOT DETECTED Final   Staphylococcus lugdunensis NOT DETECTED NOT DETECTED Final   Streptococcus species NOT DETECTED NOT DETECTED Final   Streptococcus agalactiae NOT DETECTED NOT DETECTED Final   Streptococcus pneumoniae NOT DETECTED NOT DETECTED Final   Streptococcus pyogenes NOT DETECTED NOT DETECTED Final   A.calcoaceticus-baumannii NOT DETECTED NOT DETECTED Final   Bacteroides fragilis NOT DETECTED NOT  DETECTED Final   Enterobacterales NOT DETECTED NOT DETECTED Final   Enterobacter cloacae complex NOT DETECTED NOT DETECTED Final   Escherichia coli NOT DETECTED NOT DETECTED Final   Klebsiella aerogenes NOT DETECTED NOT DETECTED Final   Klebsiella oxytoca NOT DETECTED NOT DETECTED Final   Klebsiella pneumoniae NOT DETECTED NOT DETECTED Final   Proteus species NOT DETECTED NOT DETECTED Final   Salmonella species NOT DETECTED NOT DETECTED Final   Serratia marcescens NOT DETECTED NOT DETECTED Final   Haemophilus influenzae NOT DETECTED NOT DETECTED Final   Neisseria meningitidis NOT DETECTED NOT DETECTED Final   Pseudomonas aeruginosa NOT DETECTED NOT DETECTED Final   Stenotrophomonas maltophilia NOT DETECTED NOT DETECTED Final   Candida albicans NOT DETECTED NOT DETECTED Final   Candida auris NOT DETECTED NOT DETECTED Final   Candida glabrata NOT DETECTED NOT DETECTED Final   Candida krusei NOT DETECTED NOT DETECTED Final   Candida parapsilosis NOT DETECTED NOT DETECTED Final   Candida tropicalis NOT DETECTED NOT DETECTED Final   Cryptococcus neoformans/gattii NOT DETECTED NOT DETECTED Final   Meth resistant mecA/C and MREJ DETECTED (A) NOT DETECTED Final    Comment: CRITICAL RESULT CALLED TO, READ BACK BY AND VERIFIED WITH: PHARMD E MARTIN [id number] AT 1452 BY CM Performed at [facility], [address] [place], [place] [address]   Surgical pcr screen     Status: Abnormal   Collection Time: 09/21/22  4:57 PM   Specimen: Nasal Mucosa; Nasal Swab  Result Value Ref Range Status   MRSA, PCR POSITIVE (A) NEGATIVE Final    Comment: EMAILED LINDSI FORTE ON 09/21/22 @ 2004 BY DRT   Staphylococcus aureus POSITIVE (A) NEGATIVE Final    Comment: (NOTE) The Xpert SA Assay (FDA approved for NASAL specimens in patients 84 years of age and older), is one component of a comprehensive surveillance program. It is not intended to diagnose infection nor to guide or monitor treatment. Performed  at [facility], [address], [place], [place] [address]   Aerobic/Anaerobic Culture w Gram Stain (surgical/deep wound)     Status: None (Preliminary result)   Collection Time: 09/21/22  6:27 PM   Specimen: PATH Other; Tissue  Result Value Ref Range Status   Specimen Description WOUND LEFT ARM  Final   Special Requests LEFT FOREARM PT ON ANCEF  Final   Gram Stain   Final    RARE WBC PRESENT, PREDOMINANTLY MONONUCLEAR RARE GRAM POSITIVE COCCI IN SINGLES Performed at [facility], [address] [place], [place] [address]    Culture   Final    FEW STAPHYLOCOCCUS AUREUS SUSCEPTIBILITIES PERFORMED ON PREVIOUS CULTURE WITHIN THE LAST 5 DAYS. NO ANAEROBES ISOLATED; CULTURE IN PROGRESS FOR 5 DAYS    Report Status PENDING  Incomplete  Acid Fast Smear (AFB)     Status: None   Collection Time: 09/21/22  6:27 PM   Specimen: PATH Other;  Tissue  Result Value Ref Range Status   AFB Specimen Processing Concentration  Final   Acid Fast Smear Negative  Final    Comment: (NOTE) Performed At: [facility] [address] [place], [place] [id number] Jolene Schimke MD ZJ:[id number]    Source (AFB) WOUND  Corrected    Comment: LEFT ARM Performed at [facility], [address], [place], [place] [address] CORRECTED ON 12/07 AT 2125: PREVIOUSLY REPORTED AS TISSUE LEFT ARM   Aerobic/Anaerobic Culture w Gram Stain (surgical/deep wound)     Status: None (Preliminary result)   Collection Time: 09/21/22  6:28 PM   Specimen: PATH Other; Tissue  Result Value Ref Range Status   Specimen Description TISSUE LEFT ARM  Final   Special Requests LEFT FOREARM PT ON ANCEF SAMPLE  Final   Gram Stain   Final    RARE WBC PRESENT, PREDOMINANTLY MONONUCLEAR NO ORGANISMS SEEN Performed at [facility], [address] [place], [place] [address]    Culture   Final    RARE STAPHYLOCOCCUS AUREUS SUSCEPTIBILITIES PERFORMED ON PREVIOUS CULTURE WITHIN THE LAST 5 DAYS. NO ANAEROBES  ISOLATED; CULTURE IN PROGRESS FOR 5 DAYS    Report Status PENDING  Incomplete  Acid Fast Smear (AFB)     Status: None   Collection Time: 09/21/22  6:28 PM   Specimen: PATH Other; Tissue  Result Value Ref Range Status   AFB Specimen Processing Comment  Final    Comment: Tissue Grinding and Digestion/Decontamination   Acid Fast Smear Negative  Final    Comment: (NOTE) Performed At: [facility] [address] [place], [place] [id number] Jolene Schimke MD IR:[id number]    Source (AFB) TISSUE  Final    Comment: LEFT ARM FOREARMFPREARM Performed at [facility], [address], [place], [place] [address]   Aerobic/Anaerobic Culture w Gram Stain (surgical/deep wound)     Status: None (Preliminary result)   Collection Time: 09/21/22  6:28 PM   Specimen: PATH Other; Tissue  Result Value Ref Range Status   Specimen Description WOUND RIGHT SHOULDER  Final   Special Requests SWAB PT ON ANCEF  Final   Gram Stain   Final    RARE WBC PRESENT, PREDOMINANTLY MONONUCLEAR FEW GRAM POSITIVE COCCI IN CLUSTERS Performed at [facility], [address], [place], [place] [address]    Culture   Final    ABUNDANT METHICILLIN RESISTANT STAPHYLOCOCCUS AUREUS NO ANAEROBES ISOLATED; CULTURE IN PROGRESS FOR 5 DAYS    Report Status PENDING  Incomplete   Organism ID, Bacteria METHICILLIN RESISTANT STAPHYLOCOCCUS AUREUS  Final      Susceptibility   Methicillin resistant staphylococcus aureus - MIC*    CIPROFLOXACIN <=0.5 SENSITIVE Sensitive     ERYTHROMYCIN >=8 RESISTANT Resistant     GENTAMICIN <=0.5 SENSITIVE Sensitive     OXACILLIN >=4 RESISTANT Resistant     TETRACYCLINE <=1 SENSITIVE Sensitive     VANCOMYCIN <=0.5 SENSITIVE Sensitive     TRIMETH/SULFA <=10 SENSITIVE Sensitive     CLINDAMYCIN <=0.25 SENSITIVE Sensitive     RIFAMPIN <=0.5 SENSITIVE Sensitive     Inducible Clindamycin NEGATIVE Sensitive     * ABUNDANT METHICILLIN RESISTANT STAPHYLOCOCCUS AUREUS  Acid Fast  Smear (AFB)     Status: None   Collection Time: 09/21/22  6:28 PM   Specimen: PATH Other; Tissue  Result Value Ref Range Status   AFB Specimen Processing Concentration  Final   Acid Fast Smear Negative  Final  Comment: (NOTE) Performed At: [facility] [address] [place], [place] [id number] Jolene Schimke MD KG:[id number]    Source (AFB) SHOULDER  Final    Comment: RIGHT Performed at [facility], [address] [place], [place] [address]   Aerobic/Anaerobic Culture w Gram Stain (surgical/deep wound)     Status: None (Preliminary result)   Collection Time: 09/21/22  6:44 PM   Specimen: PATH Other; Tissue  Result Value Ref Range Status   Specimen Description TISSUE RIGHT SHOULDER NO 1  Final   Special Requests PT ON ANCEF  Final   Gram Stain   Final    RARE WBC PRESENT, PREDOMINANTLY MONONUCLEAR FEW GRAM POSITIVE COCCI IN CLUSTERS Performed at [facility], [address], [place], [place] [address]    Culture   Final    ABUNDANT STAPHYLOCOCCUS AUREUS SUSCEPTIBILITIES PERFORMED ON PREVIOUS CULTURE WITHIN THE LAST 5 DAYS. NO ANAEROBES ISOLATED; CULTURE IN PROGRESS FOR 5 DAYS    Report Status PENDING  Incomplete  Aerobic/Anaerobic Culture w Gram Stain (surgical/deep wound)     Status: None (Preliminary result)   Collection Time: 09/21/22  6:44 PM   Specimen: PATH Other; Tissue  Result Value Ref Range Status   Specimen Description TISSUE RIGHT SHOULDER  Final   Special Requests SAMPLE NO 2 PT ON ANCEF  Final   Gram Stain   Final    RARE WBC PRESENT,BOTH PMN AND MONONUCLEAR FEW GRAM POSITIVE COCCI IN CLUSTERS Performed at [facility], [address] [place], [place] [address]    Culture   Final    MODERATE STAPHYLOCOCCUS AUREUS SUSCEPTIBILITIES PERFORMED ON PREVIOUS CULTURE WITHIN THE LAST 5 DAYS. NO ANAEROBES ISOLATED; CULTURE IN PROGRESS FOR 5 DAYS    Report Status PENDING  Incomplete  Acid Fast Smear (AFB)     Status: None    Collection Time: 09/21/22  6:44 PM   Specimen: PATH Other; Tissue  Result Value Ref Range Status   AFB Specimen Processing Comment  Final    Comment: Tissue Grinding and Digestion/Decontamination   Acid Fast Smear Negative  Final    Comment: (NOTE) Performed At: [facility] [address] [place], [place] [id number] Jolene Schimke MD IO:[id number]    Source (AFB) TISSUE  Corrected    Comment: RT SHOULDER NO 2 Performed at [facility], [address] [place], [place] [address] CORRECTED ON 12/08 AT 0049: PREVIOUSLY REPORTED AS WOUND RT SHOULDER SWAB   Acid Fast Smear (AFB)     Status: None   Collection Time: 09/21/22  6:44 PM   Specimen: PATH Other; Tissue  Result Value Ref Range Status   AFB Specimen Processing Comment  Final    Comment: Tissue Grinding and Digestion/Decontamination   Acid Fast Smear Negative  Final    Comment: (NOTE) Performed At: [facility] [address] [place], [place] [id number] Jolene Schimke MD FY:[id number]    Source (AFB) TISSUE  Final    Comment: RIGHT SHOULDER NO1 Performed at [facility], [address] [place], [place] [address]   MRSA Next Gen by PCR, Nasal     Status: Abnormal   Collection Time: 09/21/22  8:42 PM   Specimen: Nasal Mucosa; Nasal Swab  Result Value Ref Range Status   MRSA by PCR Next Gen DETECTED (A) NOT DETECTED Final    Comment: RESULT CALLED TO, READ BACK BY AND VERIFIED WITH:  E HUDSON,RN@0109  09/22/22 MK (NOTE) The GeneXpert MRSA Assay (FDA approved for  NASAL specimens only), is one component of a comprehensive MRSA colonization surveillance program. It is not intended to diagnose MRSA infection nor to guide or monitor treatment for MRSA infections. Test performance is not FDA approved in patients less than 40 years old. Performed at [facility], [address] [place], [place] [address]   Culture, blood (Routine X 2) w Reflex to ID Panel     Status: None (Preliminary  result)   Collection Time: 09/23/22  7:58 AM   Specimen: BLOOD RIGHT HAND  Result Value Ref Range Status   Specimen Description BLOOD RIGHT HAND  Final   Special Requests   Final    BOTTLES DRAWN AEROBIC AND ANAEROBIC Blood Culture results may not be optimal due to an inadequate volume of blood received in culture bottles   Culture   Final    NO GROWTH 2 DAYS Performed at [facility], [address], [place], [place] [address]    Report Status PENDING  Incomplete  Culture, blood (Routine X 2) w Reflex to ID Panel     Status: None (Preliminary result)   Collection Time: 09/23/22  7:58 AM   Specimen: BLOOD RIGHT HAND  Result Value Ref Range Status   Specimen Description BLOOD RIGHT HAND  Final   Special Requests   Final    BOTTLES DRAWN AEROBIC AND ANAEROBIC Blood Culture results may not be optimal due to an inadequate volume of blood received in culture bottles   Culture   Final    NO GROWTH 2 DAYS Performed at [facility], [address] [place], [place] [address]    Report Status PENDING  Incomplete    Impression/Plan:  1. MRSA bacteremia - repeat blood cultures ngtd from 12/9.  On vancomycin and stable.  No obvious vegetation on TTE.  Bilateral opacities on the CT scan concerning for septic pulmonary emboli.   Other source could be thrombophlebitis, though is difficult to determine.   At this point, I favor prolonged treatment for 6 weeks for presumed endocarditis and will continue with vancomycin.   2.  Acute kidney injury - creat rising some on vancomycin.  Will continue for now current dosing and continue to monitor.    3.  Respiratory failure - s/p extubation and doing well on nasal cannula.  Has completed treatment for COVID previously.

## 2022-09-25 NOTE — Evaluation (Signed)
Clinical/Bedside Swallow Evaluation Patient Details  Name: Madeline Jimenez MRN: [id number] Date of Birth: Apr 29, 1935  Today's Date: 09/25/2022 Time: SLP Start Time (ACUTE ONLY): 0947 SLP Stop Time (ACUTE ONLY): 1004 SLP Time Calculation (min) (ACUTE ONLY): 17 min  Past Medical History:  Past Medical History:  Diagnosis Date   COPD (chronic obstructive pulmonary disease) (HCC)    HTN (hypertension)    Hyperlipidemia    Hyponatremia    Lymphocytosis    Peripheral vascular disease (HCC)    Recurrent UTI    Past Surgical History:  Past Surgical History:  Procedure Laterality Date   ABDOMINAL HYSTERECTOMY     for fibroid   I & D EXTREMITY Right 09/21/2022   Procedure: IRRIGATION AND DEBRIDEMENT OF SHOULDER ABSCESS;  Surgeon: London Sheer, MD;  Location: [facility] OR;  Service: Orthopedics;  Laterality: Right;   INCISION AND DRAINAGE OF WOUND Left 09/21/2022   Procedure: IRRIGATION AND DEBRIDEMENT LEFT Mindi Junker;  Surgeon: London Sheer, MD;  Location: [facility] OR;  Service: Orthopedics;  Laterality: Left;   TONSILECTOMY, ADENOIDECTOMY, BILATERAL MYRINGOTOMY AND TUBES     HPI:  87 year old female admitted 12/6 with SOB and right shoulder pain. Dx sepsis secondary to COVID pna (requiring recent hospitalization, D/C 11/30) +/- cellulitis; 12/7 I&D of left forearm and right shoulder abscesses. ETT 12/7-12/10. PMHx COPD on 2L at home, HTN, HLD, PVD.    Assessment / Plan / Recommendation  Clinical Impression  Madeline Jimenez presents with s/s of an oropharyngeal dysphagia, likely transient and related to three-day oral intubation and mental status changes.  She demonstrates consistent coughing in response to drinking thin liquids; cough eliminated when liquids are thickened to nectar.  Dry solid foods elicited intermittent cough; purees were tolerated well without concern for airway intrusion.  Recommend changing oral diet to dysphagia 2 (chopped) for now and thickening liquids to nectar.  Anticipate  improvements with time post-extubation and as her inhibition/MS improves.  Will determine if instrumental swallow study will be of value. D/W RN. SLP will follow. SLP Visit Diagnosis: Dysphagia, unspecified (R13.10)    Aspiration Risk  Mild aspiration risk    Diet Recommendation   Dysphagia 2/nectar thick liquids  Medication Administration: Whole meds with puree    Other  Recommendations Oral Care Recommendations: Oral care BID Other Recommendations: Order thickener from pharmacy    Recommendations for follow up therapy are one component of a multi-disciplinary discharge planning process, led by the attending physician.  Recommendations may be updated based on patient status, additional functional criteria and insurance authorization.  Follow up Recommendations Other (comment) (yba)          Functional Status Assessment Patient has had a recent decline in their functional status and demonstrates the ability to make significant improvements in function in a reasonable and predictable amount of time.  Frequency and Duration min 2x/week  2 weeks       Prognosis Prognosis for Safe Diet Advancement: Good      Swallow Study   General Date of Onset: 09/20/22 HPI: 87 year old female admitted 12/6 with SOB and right shoulder pain. Dx sepsis secondary to COVID pna (requiring recent hospitalization, D/C 11/30) +/- cellulitis; 12/7 I&D of left forearm and right shoulder abscesses. ETT 12/7-12/10. PMHx COPD on 2L at home, HTN, HLD, PVD. Type of Study: Bedside Swallow Evaluation Previous Swallow Assessment: no Diet Prior to this Study: Thin liquids Temperature Spikes Noted: No Respiratory Status: Nasal cannula History of Recent Intubation: Yes Length of Intubations (days):  3 days Date extubated: 09/24/22 Behavior/Cognition: Alert;Confused Oral Cavity Assessment: Within Functional Limits Oral Care Completed by SLP: Recent completion by staff Oral Cavity - Dentition: Missing  dentition Self-Feeding Abilities: Needs assist;Able to feed self Patient Positioning: Upright in bed Baseline Vocal Quality: Normal Volitional Cough: Strong Volitional Swallow: Able to elicit    Oral/Motor/Sensory Function Overall Oral Motor/Sensory Function: Within functional limits   Ice Chips Ice chips: Within functional limits   Thin Liquid Thin Liquid: Impaired Presentation: Cup;Straw Pharyngeal  Phase Impairments: Cough - Immediate    Nectar Thick Nectar Thick Liquid: Within functional limits   Honey Thick Honey Thick Liquid: Not tested   Puree Puree: Within functional limits   Solid     Solid: Impaired Pharyngeal Phase Impairments: Multiple swallows;Cough - Delayed      Madeline Jimenez 09/25/2022,10:14 AM  Madeline Folks L. Samson Frederic, MA CCC/SLP Clinical Specialist - Acute Care SLP Acute Rehabilitation Services Office number [phone]

## 2022-09-25 NOTE — Progress Notes (Addendum)
Patient being transferred to [facility] Med floor. No longer requiring precedex gtt and no pressor requirement. Please see separate progress note from today for full details. Signed out to [facility] Medicine Teaching Service, they will resume care 12/12 at 7AM.

## 2022-09-26 LAB — COMPREHENSIVE METABOLIC PANEL
ALT: 76 U/L — ABNORMAL HIGH (ref 0–44)
AST: 68 U/L — ABNORMAL HIGH (ref 15–41)
Albumin: 1.9 g/dL — ABNORMAL LOW (ref 3.5–5.0)
Alkaline Phosphatase: 77 U/L (ref 38–126)
Anion gap: 9 (ref 5–15)
BUN: 47 mg/dL — ABNORMAL HIGH (ref 8–23)
CO2: 28 mmol/L (ref 22–32)
Calcium: 8.6 mg/dL — ABNORMAL LOW (ref 8.9–10.3)
Chloride: 102 mmol/L (ref 98–111)
Creatinine, Ser: 1.15 mg/dL — ABNORMAL HIGH (ref 0.44–1.00)
GFR, Estimated: 47 mL/min — ABNORMAL LOW (ref >60)
Glucose, Bld: 135 mg/dL — ABNORMAL HIGH (ref 70–99)
Potassium: 3.8 mmol/L (ref 3.5–5.1)
Sodium: 139 mmol/L (ref 135–145)
Total Bilirubin: 0.6 mg/dL (ref 0.3–1.2)
Total Protein: 5.3 g/dL — ABNORMAL LOW (ref 6.5–8.1)

## 2022-09-26 LAB — GLUCOSE, CAPILLARY
Glucose-Capillary: 100 mg/dL — ABNORMAL HIGH (ref 70–99)
Glucose-Capillary: 104 mg/dL — ABNORMAL HIGH (ref 70–99)
Glucose-Capillary: 115 mg/dL — ABNORMAL HIGH (ref 70–99)
Glucose-Capillary: 118 mg/dL — ABNORMAL HIGH (ref 70–99)

## 2022-09-26 LAB — CBC
HCT: 30.4 % — ABNORMAL LOW (ref 36.0–46.0)
Hemoglobin: 10.1 g/dL — ABNORMAL LOW (ref 12.0–15.0)
MCH: 30 pg (ref 26.0–34.0)
MCHC: 33.2 g/dL (ref 30.0–36.0)
MCV: 90.2 fL (ref 80.0–100.0)
Platelets: 199 10*3/uL (ref 150–400)
RBC: 3.37 MIL/uL — ABNORMAL LOW (ref 3.87–5.11)
RDW: 14.6 % (ref 11.5–15.5)
WBC: 27.1 10*3/uL — ABNORMAL HIGH (ref 4.0–10.5)
nRBC: 0 % (ref 0.0–0.2)

## 2022-09-26 MED ORDER — ACETAMINOPHEN 325 MG PO TABS
650.0000 mg | ORAL_TABLET | Freq: Four times a day (QID) | ORAL | Status: DC
  Administered 2022-09-26 – 2022-09-29 (×11): 650 mg via ORAL
  Filled 2022-09-26 (×11): qty 2

## 2022-09-26 MED ORDER — SODIUM CHLORIDE 0.9 % IV SOLN
100.0000 mg | Freq: Two times a day (BID) | INTRAVENOUS | Status: DC
  Administered 2022-09-26 – 2022-09-28 (×5): 100 mg via INTRAVENOUS
  Filled 2022-09-26 (×6): qty 100

## 2022-09-26 MED ORDER — AMLODIPINE BESYLATE 5 MG PO TABS
5.0000 mg | ORAL_TABLET | Freq: Every day | ORAL | Status: DC
  Administered 2022-09-26 – 2022-10-03 (×8): 5 mg via ORAL
  Filled 2022-09-26 (×8): qty 1

## 2022-09-26 MED ORDER — SODIUM CHLORIDE 0.9 % IV SOLN
8.0000 mg/kg | Freq: Every day | INTRAVENOUS | Status: DC
  Administered 2022-09-26 – 2022-10-02 (×7): 450 mg via INTRAVENOUS
  Filled 2022-09-26 (×8): qty 9

## 2022-09-26 NOTE — Progress Notes (Signed)
Pharmacy Antibiotic Note  Madeline Jimenez is a 87 y.o. female admitted on 09/20/2022 with MRSA bacteremia and concern for pulmonary septic emboli.  Pharmacy has been consulted to transition Vancomycin to Daptomycin and add Doxy for additional lung penetration.   Noted resolving AKI with peak of 1.68 on 12/10 - now down to 1.15, estimated CrCl~30 ml/min. Repeat blood cultures from 12/9 still with 1 bottle positive for GPC in clusters - presumably MRSA.  Plan: - D/c Vancomycin - Start Daptomycin 450 mg (8 mg/kg) IV every 24 hours - first now then subsequent doses scheduled for 2000 - Add Doxycycline 100 mg IV every 12 hours for lung penetration - can transition to oral once taking po better - Will monitor renal function and tolerability of antibiotic regimen  Height: 5\' 4"  (162.6 cm) Weight: 58.9 kg (129 lb 13.6 oz) IBW/kg (Calculated) : 54.7  Temp (24hrs), Avg:98.6 F (37 C), Min:98.3 F (36.8 C), Max:98.9 F (37.2 C)  Recent Labs  Lab 09/20/22 2208 09/21/22 0200 09/22/22 0303 09/22/22 1014 09/23/22 0753 09/24/22 0639 09/25/22 0147 09/25/22 1706 09/26/22 0236  WBC 66.9* 57.5* 53.1*  --  24.4* 18.5* 22.2*  --  27.1*  CREATININE 1.62* 1.27* 1.49*  --   --  1.68*  --  1.28* 1.15*  LATICACIDVEN 2.8* 1.6  --  1.2 1.7  --   --   --   --     Estimated Creatinine Clearance: 30.9 mL/min (A) (by C-G formula based on SCr of 1.15 mg/dL (H)).    No Known Allergies  Antimicrobials this admission: Ceftriaxone 12/6 x 1 Azithro 12/6 x 1 Cefazolin 12/7 x 1 Cefepime 12/7 x 1 Vancomycin 12/7 >> 12/12 Daptomycin 12/12 >> Doxycycline 12/12 >>  Dose adjustments this admission: N/a  Microbiology results: 12/6 BCx >> 2/2 bottles (1 set) >> MRSA 12/7 OR cx (shoulder) x 3 >> MRSA 12/7 L-arm cultures >> few SA 12/9 repeat BCx >> 1/4 bottles GPC in clusters (presumably MRSA)  Thank you for allowing pharmacy to be a part of this patient's care.  14/9, PharmD, BCPS Infectious  Diseases Clinical Pharmacist 09/26/2022 8:40 AM   **Pharmacist phone directory can now be found on [email or web address] ([email or web address]).  Listed under [facility].

## 2022-09-26 NOTE — Progress Notes (Signed)
Speech Language Pathology Treatment: Dysphagia  Patient Details Name: Madeline Jimenez MRN: [id number] DOB: 01-07-1935 Today's Date: 09/26/2022 Time: 0174-9449 SLP Time Calculation (min) (ACUTE ONLY): 20 min  Assessment / Plan / Recommendation Clinical Impression  Patient seen by SLP for skilled treatment focused on dysphagia goals. Patient was awake, alert and telling SLP she "feels better". When asked about PO's she said she had not eaten much but was thirsty. SLP then observed her with PO intake of thin liquids (water). She was able to hold cup in hand and give herself a few sips then asking for a straw. She consumed consecutive straw sips of water with timely swallow initiation and no overt s/s aspiration or penetration. No changes in voice or vitals. SLP recommending upgrade liquids to thin and continue with Dys 2 (minced) solids. SLP will continue to follow patient for ability to upgrade solids.    HPI HPI: 87 year old female admitted 12/6 with SOB and right shoulder pain. Dx sepsis secondary to COVID pna (requiring recent hospitalization, D/C 11/30) +/- cellulitis; 12/7 I&D of left forearm and right shoulder abscesses. ETT 12/7-12/10. PMHx COPD on 2L at home, HTN, HLD, PVD.      SLP Plan  Continue with current plan of care      Recommendations for follow up therapy are one component of a multi-disciplinary discharge planning process, led by the attending physician.  Recommendations may be updated based on patient status, additional functional criteria and insurance authorization.    Recommendations  Diet recommendations: Thin liquid;Dysphagia 2 (fine chop) Liquids provided via: Straw;Cup Medication Administration: Whole meds with puree Supervision: Patient able to self feed;Intermittent supervision to cue for compensatory strategies Compensations: Slow rate;Small sips/bites Postural Changes and/or Swallow Maneuvers: Seated upright 90 degrees                Oral Care  Recommendations: Oral care BID Follow Up Recommendations: Other (comment) (TBD) Assistance recommended at discharge: Intermittent Supervision/Assistance SLP Visit Diagnosis: Dysphagia, unspecified (R13.10) Plan: Continue with current plan of care          Angela Nevin, MA, CCC-SLP Speech Therapy

## 2022-09-26 NOTE — Progress Notes (Signed)
Orthopedic Surgery Progress Note   Assessment: Patient is a 87 y.o. female with right shoulder abscess s/p I&D, left forearm abscess s/p I&D   Plan: -Operative plans: complete -Okay for diet and dvt ppx from ortho perspective -Antibiotics: per primary -Will switch to daily iodoform packing changes to right shoulder -Weight bearing status: as tolerated -Follow up intra-op cultures (staph aureus) -PT/OT evaluate and treat -Pain control -Dispo: per primary  ___________________________________________________________________________  Subjective: No acute events overnight. Not having any left forearm pain, but still having right shoulder pain. No pain in any other extremity.    Physical Exam:  General: no acute distress, appears stated age, laying in bed Neurologic: awake, following commands, answering questions appropriately Respiratory: unlabored breathing  MSK:   -Right upper extremity  Dressing with drainage on it Fires deltoid, biceps, triceps, wrist extensors, wrist flexors, finger extensors, finger flexors AIN/PIN/IO intact  Sensation intact to light touch in median/ulnar/radial/axillary nerve distributions  Hand warm and well perfused, palpable radial pulse  No pain with internal and external rotation at shoulder  -Left upper extremity  Surgical incision with scab over it, appears to be healing as expected, erythema around wrist but no visible purulence or palpable fluid collection Fires deltoid, biceps, triceps, wrist extensors, wrist flexors, finger extensors, finger flexors AIN/PIN/IO intact  Sensation intact to light touch in median/ulnar/radial/axillary nerve distributions  Hand warm and well perfused, palpable radial pulse  Patient name: Madeline Jimenez Patient MRN: [id number] Date: 09/26/22

## 2022-09-26 NOTE — Progress Notes (Signed)
[facility]   Reason for visit: Follow up on bacteremia  Interval History: repeat blood culture GPC in clusters in 1/4 bottles; nad WBC 27.1 and otherwise remains afebrile.   Day 7 vancomycin  Physical Exam: Constitutional:  Vitals:   09/26/22 0809 09/26/22 0900  BP:  (!) 145/81  Pulse:  (!) 122  Resp:  17  Temp: 98.3 F (36.8 C)   SpO2: 98% 94%   patient appears in NAD Eyes: anicteric HENT: + nasal cannula Respiratory: Normal respiratory effort  Review of Systems: Constitutional: negative for fevers and chills  Lab Results  Component Value Date   WBC 27.1 (H) 09/26/2022   HGB 10.1 (L) 09/26/2022   HCT 30.4 (L) 09/26/2022   MCV 90.2 09/26/2022   PLT 199 09/26/2022    Lab Results  Component Value Date   CREATININE 1.15 (H) 09/26/2022   BUN 47 (H) 09/26/2022   NA 139 09/26/2022   K 3.8 09/26/2022   CL 102 09/26/2022   CO2 28 09/26/2022    Lab Results  Component Value Date   ALT 76 (H) 09/26/2022   AST 68 (H) 09/26/2022   ALKPHOS 77 09/26/2022     Microbiology: Recent Results (from the past 240 hour(s))  Blood Culture (routine x 2)     Status: Abnormal   Collection Time: 09/20/22 10:08 PM   Specimen: BLOOD  Result Value Ref Range Status   Specimen Description BLOOD RIGHT ANTECUBITAL  Final   Special Requests   Final    BOTTLES DRAWN AEROBIC AND ANAEROBIC Blood Culture results may not be optimal due to an excessive volume of blood received in culture bottles   Culture  Setup Time   Final    GRAM POSITIVE COCCI IN CLUSTERS IN BOTH AEROBIC AND ANAEROBIC BOTTLES CRITICAL RESULT CALLED TO, READ BACK BY AND VERIFIED WITH: PHARMD E MARTIN [id number] AT 1452 BY CM Performed at [facility] Lab, [address], [place], [place] [address]    Culture METHICILLIN RESISTANT STAPHYLOCOCCUS AUREUS (A)  Final   Report Status 09/23/2022 FINAL  Final   Organism ID, Bacteria METHICILLIN RESISTANT STAPHYLOCOCCUS AUREUS  Final       Susceptibility   Methicillin resistant staphylococcus aureus - MIC*    CIPROFLOXACIN <=0.5 SENSITIVE Sensitive     ERYTHROMYCIN >=8 RESISTANT Resistant     GENTAMICIN <=0.5 SENSITIVE Sensitive     OXACILLIN >=4 RESISTANT Resistant     TETRACYCLINE <=1 SENSITIVE Sensitive     VANCOMYCIN 1 SENSITIVE Sensitive     TRIMETH/SULFA <=10 SENSITIVE Sensitive     CLINDAMYCIN <=0.25 SENSITIVE Sensitive     RIFAMPIN <=0.5 SENSITIVE Sensitive     Inducible Clindamycin NEGATIVE Sensitive     * METHICILLIN RESISTANT STAPHYLOCOCCUS AUREUS  Blood Culture ID Panel (Reflexed)     Status: Abnormal   Collection Time: 09/20/22 10:08 PM  Result Value Ref Range Status   Enterococcus faecalis NOT DETECTED NOT DETECTED Final   Enterococcus Faecium NOT DETECTED NOT DETECTED Final   Listeria monocytogenes NOT DETECTED NOT DETECTED Final   Staphylococcus species DETECTED (A) NOT DETECTED Final    Comment: CRITICAL RESULT CALLED TO, READ BACK BY AND VERIFIED WITH: PHARMD E MARTIN [id number] AT 1452 BY CM    Staphylococcus aureus (BCID) DETECTED (A) NOT DETECTED Final    Comment: Methicillin (oxacillin)-resistant Staphylococcus aureus (MRSA). MRSA is predictably resistant to beta-lactam antibiotics (except ceftaroline). Preferred therapy is vancomycin unless clinically contraindicated. Patient requires contact precautions if  hospitalized. CRITICAL RESULT CALLED TO, READ BACK BY AND VERIFIED WITH: PHARMD E MARTIN [id number] AT 1452 BY CM    Staphylococcus epidermidis NOT DETECTED NOT DETECTED Final   Staphylococcus lugdunensis NOT DETECTED NOT DETECTED Final   Streptococcus species NOT DETECTED NOT DETECTED Final   Streptococcus agalactiae NOT DETECTED NOT DETECTED Final   Streptococcus pneumoniae NOT DETECTED NOT DETECTED Final   Streptococcus pyogenes NOT DETECTED NOT DETECTED Final   A.calcoaceticus-baumannii NOT DETECTED NOT DETECTED Final   Bacteroides fragilis NOT DETECTED NOT DETECTED Final   Enterobacterales  NOT DETECTED NOT DETECTED Final   Enterobacter cloacae complex NOT DETECTED NOT DETECTED Final   Escherichia coli NOT DETECTED NOT DETECTED Final   Klebsiella aerogenes NOT DETECTED NOT DETECTED Final   Klebsiella oxytoca NOT DETECTED NOT DETECTED Final   Klebsiella pneumoniae NOT DETECTED NOT DETECTED Final   Proteus species NOT DETECTED NOT DETECTED Final   Salmonella species NOT DETECTED NOT DETECTED Final   Serratia marcescens NOT DETECTED NOT DETECTED Final   Haemophilus influenzae NOT DETECTED NOT DETECTED Final   Neisseria meningitidis NOT DETECTED NOT DETECTED Final   Pseudomonas aeruginosa NOT DETECTED NOT DETECTED Final   Stenotrophomonas maltophilia NOT DETECTED NOT DETECTED Final   Candida albicans NOT DETECTED NOT DETECTED Final   Candida auris NOT DETECTED NOT DETECTED Final   Candida glabrata NOT DETECTED NOT DETECTED Final   Candida krusei NOT DETECTED NOT DETECTED Final   Candida parapsilosis NOT DETECTED NOT DETECTED Final   Candida tropicalis NOT DETECTED NOT DETECTED Final   Cryptococcus neoformans/gattii NOT DETECTED NOT DETECTED Final   Meth resistant mecA/C and MREJ DETECTED (A) NOT DETECTED Final    Comment: CRITICAL RESULT CALLED TO, READ BACK BY AND VERIFIED WITH: PHARMD E MARTIN [id number] AT 1452 BY CM Performed at [facility], [address], [place], [place] [address]   Surgical pcr screen     Status: Abnormal   Collection Time: 09/21/22  4:57 PM   Specimen: Nasal Mucosa; Nasal Swab  Result Value Ref Range Status   MRSA, PCR POSITIVE (A) NEGATIVE Final    Comment: EMAILED LINDSI FORTE ON 09/21/22 @ 2004 BY DRT   Staphylococcus aureus POSITIVE (A) NEGATIVE Final    Comment: (NOTE) The Xpert SA Assay (FDA approved for NASAL specimens in patients 59 years of age and older), is one component of a comprehensive surveillance program. It is not intended to diagnose infection nor to guide or monitor treatment. Performed at [facility], [address], [place], [place] [address]   Aerobic/Anaerobic Culture w Gram Stain (surgical/deep wound)     Status: None (Preliminary result)   Collection Time: 09/21/22  6:27 PM   Specimen: PATH Other; Tissue  Result Value Ref Range Status   Specimen Description WOUND LEFT ARM  Final   Special Requests LEFT FOREARM PT ON ANCEF  Final   Gram Stain   Final    RARE WBC PRESENT, PREDOMINANTLY MONONUCLEAR RARE GRAM POSITIVE COCCI IN SINGLES Performed at [facility], [address] [place], [place] [address]    Culture   Final    FEW STAPHYLOCOCCUS AUREUS SUSCEPTIBILITIES PERFORMED ON PREVIOUS CULTURE WITHIN THE LAST 5 DAYS. NO ANAEROBES ISOLATED; CULTURE IN PROGRESS FOR 5 DAYS    Report Status PENDING  Incomplete  Acid Fast Smear (AFB)     Status: None   Collection Time: 09/21/22  6:27 PM   Specimen: PATH Other; Tissue  Result Value Ref Range Status  AFB Specimen Processing Concentration  Final   Acid Fast Smear Negative  Final    Comment: (NOTE) Performed At: [facility] [address] [place], [place] [id number] Jolene Schimke MD ON:[id number]    Source (AFB) WOUND  Corrected    Comment: LEFT ARM Performed at [facility], [address] [place] [address] [place], [place] [address] CORRECTED ON 12/07 AT 2125: PREVIOUSLY REPORTED AS TISSUE LEFT ARM   Aerobic/Anaerobic Culture w Gram Stain (surgical/deep wound)     Status: None (Preliminary result)   Collection Time: 09/21/22  6:28 PM   Specimen: PATH Other; Tissue  Result Value Ref Range Status   Specimen Description TISSUE LEFT ARM  Final   Special Requests LEFT FOREARM PT ON ANCEF SAMPLE  Final   Gram Stain   Final    RARE WBC PRESENT, PREDOMINANTLY MONONUCLEAR NO ORGANISMS SEEN Performed at [facility], [address] [place], [place] [address]    Culture   Final    RARE STAPHYLOCOCCUS AUREUS SUSCEPTIBILITIES PERFORMED ON PREVIOUS CULTURE WITHIN THE LAST 5 DAYS. NO ANAEROBES ISOLATED; CULTURE IN PROGRESS FOR 5  DAYS    Report Status PENDING  Incomplete  Acid Fast Smear (AFB)     Status: None   Collection Time: 09/21/22  6:28 PM   Specimen: PATH Other; Tissue  Result Value Ref Range Status   AFB Specimen Processing Comment  Final    Comment: Tissue Grinding and Digestion/Decontamination   Acid Fast Smear Negative  Final    Comment: (NOTE) Performed At: [facility] [address] [place], [place] [id number] Jolene Schimke MD QV:[id number]    Source (AFB) TISSUE  Final    Comment: LEFT ARM FOREARMFPREARM Performed at [facility], [address], [place], [place] [address]   Aerobic/Anaerobic Culture w Gram Stain (surgical/deep wound)     Status: None (Preliminary result)   Collection Time: 09/21/22  6:28 PM   Specimen: PATH Other; Tissue  Result Value Ref Range Status   Specimen Description WOUND RIGHT SHOULDER  Final   Special Requests SWAB PT ON ANCEF  Final   Gram Stain   Final    RARE WBC PRESENT, PREDOMINANTLY MONONUCLEAR FEW GRAM POSITIVE COCCI IN CLUSTERS Performed at [facility], [address], [place], [place] [address]    Culture   Final    ABUNDANT METHICILLIN RESISTANT STAPHYLOCOCCUS AUREUS NO ANAEROBES ISOLATED; CULTURE IN PROGRESS FOR 5 DAYS    Report Status PENDING  Incomplete   Organism ID, Bacteria METHICILLIN RESISTANT STAPHYLOCOCCUS AUREUS  Final      Susceptibility   Methicillin resistant staphylococcus aureus - MIC*    CIPROFLOXACIN <=0.5 SENSITIVE Sensitive     ERYTHROMYCIN >=8 RESISTANT Resistant     GENTAMICIN <=0.5 SENSITIVE Sensitive     OXACILLIN >=4 RESISTANT Resistant     TETRACYCLINE <=1 SENSITIVE Sensitive     VANCOMYCIN <=0.5 SENSITIVE Sensitive     TRIMETH/SULFA <=10 SENSITIVE Sensitive     CLINDAMYCIN <=0.25 SENSITIVE Sensitive     RIFAMPIN <=0.5 SENSITIVE Sensitive     Inducible Clindamycin NEGATIVE Sensitive     * ABUNDANT METHICILLIN RESISTANT STAPHYLOCOCCUS AUREUS  Acid Fast Smear (AFB)     Status: None    Collection Time: 09/21/22  6:28 PM   Specimen: PATH Other; Tissue  Result Value Ref Range Status   AFB Specimen Processing Concentration  Final   Acid Fast Smear Negative  Final    Comment: (NOTE) Performed At: [facility] [address] [place], [place] [id number] Jolene SchimkeNagendra Sanjai MD WU:[id number]:[phone]    Source (AFB) SHOULDER  Final    Comment: RIGHT Performed at [facility], [address] [place], [place] [address]   Aerobic/Anaerobic Culture w Gram Stain (surgical/deep wound)     Status: None (Preliminary result)   Collection Time: 09/21/22  6:44 PM   Specimen: PATH Other; Tissue  Result Value Ref Range Status   Specimen Description TISSUE RIGHT SHOULDER NO 1  Final   Special Requests PT ON ANCEF  Final   Gram Stain   Final    RARE WBC PRESENT, PREDOMINANTLY MONONUCLEAR FEW GRAM POSITIVE COCCI IN CLUSTERS Performed at [facility], [address] [place], [place] [address]    Culture   Final    ABUNDANT STAPHYLOCOCCUS AUREUS SUSCEPTIBILITIES PERFORMED ON PREVIOUS CULTURE WITHIN THE LAST 5 DAYS. NO ANAEROBES ISOLATED; CULTURE IN PROGRESS FOR 5 DAYS    Report Status PENDING  Incomplete  Aerobic/Anaerobic Culture w Gram Stain (surgical/deep wound)     Status: None (Preliminary result)   Collection Time: 09/21/22  6:44 PM   Specimen: PATH Other; Tissue  Result Value Ref Range Status   Specimen Description TISSUE RIGHT SHOULDER  Final   Special Requests SAMPLE NO 2 PT ON ANCEF  Final   Gram Stain   Final    RARE WBC PRESENT,BOTH PMN AND MONONUCLEAR FEW GRAM POSITIVE COCCI IN CLUSTERS Performed at [facility], [address] [place], [place] [address]    Culture   Final    MODERATE STAPHYLOCOCCUS AUREUS SUSCEPTIBILITIES PERFORMED ON PREVIOUS CULTURE WITHIN THE LAST 5 DAYS. NO ANAEROBES ISOLATED; CULTURE IN PROGRESS FOR 5 DAYS    Report Status PENDING  Incomplete  Acid Fast Smear (AFB)     Status: None   Collection Time: 09/21/22  6:44 PM    Specimen: PATH Other; Tissue  Result Value Ref Range Status   AFB Specimen Processing Comment  Final    Comment: Tissue Grinding and Digestion/Decontamination   Acid Fast Smear Negative  Final    Comment: (NOTE) Performed At: [facility] [address] [place], [place] [id number] Jolene SchimkeNagendra Sanjai MD KG:[id number]:[phone]    Source (AFB) TISSUE  Corrected    Comment: RT SHOULDER NO 2 Performed at [facility], [address] [place], [place] [address] CORRECTED ON 12/08 AT 0049: PREVIOUSLY REPORTED AS WOUND RT SHOULDER SWAB   Acid Fast Smear (AFB)     Status: None   Collection Time: 09/21/22  6:44 PM   Specimen: PATH Other; Tissue  Result Value Ref Range Status   AFB Specimen Processing Comment  Final    Comment: Tissue Grinding and Digestion/Decontamination   Acid Fast Smear Negative  Final    Comment: (NOTE) Performed At: [facility] [place] [address] [place], [place] [id number] Jolene SchimkeNagendra Sanjai MD OV:[id number]:[phone]    Source (AFB) TISSUE  Final    Comment: RIGHT SHOULDER NO1 Performed at [facility], [address] [place], [place] [address]   MRSA Next Gen by PCR, Nasal     Status: Abnormal   Collection Time: 09/21/22  8:42 PM   Specimen: Nasal Mucosa; Nasal Swab  Result Value Ref Range Status   MRSA by PCR Next Gen DETECTED (A) NOT DETECTED Final    Comment: RESULT CALLED TO, READ BACK BY AND VERIFIED WITH:  E HUDSON,RN@0109  09/22/22 MK (NOTE) The GeneXpert MRSA Assay (FDA approved for NASAL specimens only), is one component of a  comprehensive MRSA colonization surveillance program. It is not intended to diagnose MRSA infection nor to guide or monitor treatment for MRSA infections. Test performance is not FDA approved in patients less than 57 years old. Performed at [facility], [address] [place], [place] [address]   Culture, blood (Routine X 2) w Reflex to ID Panel     Status: None (Preliminary result)   Collection Time: 09/23/22  7:58  AM   Specimen: BLOOD RIGHT HAND  Result Value Ref Range Status   Specimen Description BLOOD RIGHT HAND  Final   Special Requests   Final    BOTTLES DRAWN AEROBIC AND ANAEROBIC Blood Culture results may not be optimal due to an inadequate volume of blood received in culture bottles   Culture   Final    NO GROWTH 2 DAYS Performed at [facility], [address], [place], [place] [address]    Report Status PENDING  Incomplete  Culture, blood (Routine X 2) w Reflex to ID Panel     Status: None (Preliminary result)   Collection Time: 09/23/22  7:58 AM   Specimen: BLOOD RIGHT HAND  Result Value Ref Range Status   Specimen Description BLOOD RIGHT HAND  Final   Special Requests   Final    BOTTLES DRAWN AEROBIC AND ANAEROBIC Blood Culture results may not be optimal due to an inadequate volume of blood received in culture bottles   Culture  Setup Time   Final    GRAM POSITIVE COCCI IN CLUSTERS ANAEROBIC BOTTLE ONLY CRITICAL RESULT CALLED TO, READ BACK BY AND VERIFIED WITH: Graciela Husbands [id number] FCP Performed at [facility], [address], [place], [place] [address]    Culture [facility] POSITIVE COCCI  Final   Report Status PENDING  Incomplete    Impression/Plan:  1. MRSA bacteremia - repeat blood culture again growing GPC.  May be a contaminate or the MRSA again.  Will repeat blood cultures in the meantime.  She otherwise is relatively stable.  Plan for 6 weeks of treatment.  2.  Medication monitoring - difficult to determine correct vancomcyin dosing.  At this point, will change her to daptomycin and doxycyline.   Will check a CK level  3.  COVID - now just on nasal cannula which she uses at home.   Continue supportive care .

## 2022-09-26 NOTE — Progress Notes (Addendum)
Daily Progress Note Intern Pager: [phone]  Patient name: Madeline Jimenez Medical record number: [id number] Date of birth: 1935/04/20 Age: 87 y.o. Gender: female  Primary Care Provider: Lorella Nimrod, MD Consultants: ID, PCCM s/o 12/11, Orthopedic Surgery s/o 12/10  Code Status: Partial, no CPR or meds ok for short term intubation   Pt Overview and Major Events to Date:  12/6 - Admitted, Started on Vancomycin  12/7 - Shoulder and left forearm I&D, taken to ICU after inability to extubate, Started on heparin gtt for segmental PE   12/10 - Extubated on nasal cannula  12/11 - Transitioned from heparin to eliquis for PE 12/12 - Transferred from ICU to floor, transitioned from vancomycin to daptomycin and doxycyline  Assessment and Plan: Madeline Jimenez is a 87 y.o. female who was admitted for acute on chronic respiratory failure, found to have MRSA bacteremia with cavitary pneumonia, shoulder abscess, and forearm cellulitis s/p I&D, and bilateral segmental PE. Now transferred from ICU to the floor.  PMH significant for recent admission for COVID (dc on 11/30), COPD on 2L home O2, HTN and HLD.   * Sepsis ([place]) s/t MRSA bacteremia Patient transferred from ICU. MRSA bacteremia most likely s/t to septic thrombophlebitis. Could have bacterial endocarditis causing septic emboli which caused the cavitary PNA, and abscesses. ECHO without evidence of endocarditis. Bcx from 12/9 Gram + cocci in clusters. WBC improved initially and has been increasing past few days. WBC 22.2 yesterday > 27.1.  - Continue antibiotics: now switched to daptomycin and doxycycline  - ID consulted, appreciate recs - TEE planned for 12/15.  - Vitals per floor protocol, continue tele - Repeat blood cultures - Talk to patient regarding [place] and palliative  Acute on chronic respiratory failure (Madeline Jimenez) Patient now on 5L nasal cannula. On 2L home oxygen for COPD. Respiratory failure in the setting of cavitary pna and  bilateral segmental pna.  - Continue abx as mentioned above  - Continue eliquis for PE  - Oxygen therapy as needed to maintain sats 88-92%  - Incentive spirometry  - Continue Brovana, atrovent, and pulmicort  AKI (acute kidney injury) (Madeline Jimenez) AKI intrarenal secondary to ATN from sepsis. Improving now Cr 1.15, baseline around .85-.9.  - BMP daily  - Avoid nephrotoxic agents - Encourage po hydration   Right shoulder pain S/p I&D with orthopedic surgery on 12/7.  - Tylenol 650 mg q6hrs scheduled  - Patient with copious drainage from I&D site, ortho to see patient today   Hypertension On hydral q6hrs prn for SBP 140-150. Home medication amlodipine 5 mg daily.  - Restart home amlodipine  - Stop hydral.     FEN/GI: Dysphagia 2  PPx: On Eliquis for PE Dispo:SNF pending clinical improvement  Subjective:  Patient has no complaints. She says that she just received   Objective: Temp:  [98.3 F (36.8 C)-98.9 F (37.2 C)] 98.3 F (36.8 C) (12/12 0809) Pulse Rate:  [59-128] 122 (12/12 0900) Resp:  [15-26] 17 (12/12 0900) BP: (107-176)/(55-104) 145/81 (12/12 0900) SpO2:  [90 %-98 %] 94 % (12/12 0900) Weight:  [58.9 kg] 58.9 kg (12/12 0324) Physical Exam: General: In no acute distress, ill appearing, but uncomfortable  Cardiovascular: tachycardia but regular rhythm, cap refill < 2 seconds Respiratory: inspiratory crackles, diminished breath sounds right mid lung field and bilateral bases, no increased work of breathing on 5 L  Abdomen: Soft, non tender, non distended Extremities: No BLE edema  Neuro: A&O x 4, very hard of hearing  MSK: Right  shoulder abscess with dressing on but copious purulent drainage, left forearm erythematous with eschar without discharge   Laboratory: Most recent CBC Lab Results  Component Value Date   WBC 27.1 (H) 09/26/2022   HGB 10.1 (L) 09/26/2022   HCT 30.4 (L) 09/26/2022   MCV 90.2 09/26/2022   PLT 199 09/26/2022   Most recent BMP    Latest  Ref Rng & Units 09/26/2022    2:36 AM  BMP  Glucose 70 - 99 mg/dL 973   BUN 8 - 23 mg/dL 47   Creatinine 5.32 - 1.00 mg/dL 9.92   Sodium 426 - 834 mmol/L 139   Potassium 3.5 - 5.1 mmol/L 3.8   Chloride 98 - 111 mmol/L 102   CO2 22 - 32 mmol/L 28   Calcium 8.9 - 10.3 mg/dL 8.6     Lockie Mola, MD 09/26/2022, 11:08 AM  PGY-1, [place] [facility] FPTS Intern pager: [phone], text pages welcome Secure chat group [facility] Teaching Service

## 2022-09-26 NOTE — Progress Notes (Signed)
FMTS Brief Progress Note  S:Patient laying in bed with the lights off when I came into the room. Denies SOB. Endorses some continued right shoulder pain. Denies any other questions or concerns.    O: BP (!) 143/64 (BP Location: Right Arm)   Pulse 65   Temp (!) 97.5 F (36.4 C) (Axillary)   Resp 17   Ht 5\' 4"  (1.626 m)   Wt 58.9 kg   SpO2 97%   BMI 22.29 kg/m   General: alert, NAD Resp: Normal WOB on 4L [place] Ext: moving spontaneously   A/P: MRSA bacteremia Acute on chronic respiratory failure - Id following, appreciate recs - continue daptomycin and doxycycline. Will need 6 weeks abx tx  - TEE on 12/15 - f/u blood cultures - Tylenol 650mg  q6h for pain - continue O2 to maintain sats 88-92% - Continue eliquis for PE  - Continue Brovana, atrovent, and pulmicort   - Orders reviewed. Labs for AM ordered, which was adjusted as needed.   Remainder of plan per day progress note  1/16, DO 09/26/2022, 8:01 PM PGY-3, [facility] Night Resident  Please page [phone] with questions.

## 2022-09-27 LAB — AEROBIC/ANAEROBIC CULTURE W GRAM STAIN (SURGICAL/DEEP WOUND)

## 2022-09-27 LAB — CBC
HCT: 32.1 % — ABNORMAL LOW (ref 36.0–46.0)
Hemoglobin: 10.2 g/dL — ABNORMAL LOW (ref 12.0–15.0)
MCH: 29.6 pg (ref 26.0–34.0)
MCHC: 31.8 g/dL (ref 30.0–36.0)
MCV: 93 fL (ref 80.0–100.0)
Platelets: 235 10*3/uL (ref 150–400)
RBC: 3.45 MIL/uL — ABNORMAL LOW (ref 3.87–5.11)
RDW: 14.6 % (ref 11.5–15.5)
WBC: 26.7 10*3/uL — ABNORMAL HIGH (ref 4.0–10.5)
nRBC: 0 % (ref 0.0–0.2)

## 2022-09-27 LAB — BASIC METABOLIC PANEL
Anion gap: 7 (ref 5–15)
BUN: 29 mg/dL — ABNORMAL HIGH (ref 8–23)
CO2: 30 mmol/L (ref 22–32)
Calcium: 8.7 mg/dL — ABNORMAL LOW (ref 8.9–10.3)
Chloride: 104 mmol/L (ref 98–111)
Creatinine, Ser: 0.9 mg/dL (ref 0.44–1.00)
GFR, Estimated: >60 mL/min (ref >60)
Glucose, Bld: 105 mg/dL — ABNORMAL HIGH (ref 70–99)
Potassium: 4.1 mmol/L (ref 3.5–5.1)
Sodium: 141 mmol/L (ref 135–145)

## 2022-09-27 LAB — CK: Total CK: 201 U/L (ref 38–234)

## 2022-09-27 NOTE — Progress Notes (Signed)
Daily Progress Note Intern Pager: [phone]  Patient name: Madeline Jimenez Medical record number: [id number] Date of birth: 1935-02-09 Age: 87 y.o. Gender: female  Primary Care Provider: Arnetha Courser, MD Consultants: ID, PCCM s/o 12/11, Orthopedic Surgery s/o 12/10  Code Status: Partial, no CPR or meds ok for short term intubation    Pt Overview and Major Events to Date:  12/6 - Admitted, Started on Vancomycin  12/7 - Shoulder and left forearm I&D, taken to ICU after inability to extubate, Started on heparin gtt for segmental PE   12/10 - Extubated on nasal cannula  12/11 - Transitioned from heparin to eliquis for PE 12/12 - Transferred from ICU to floor, transitioned from vancomycin to daptomycin and doxycyline   Assessment and Plan: Madeline Jimenez is a 87 y.o. female who was admitted for acute on chronic respiratory failure, found to have MRSA bacteremia with cavitary pneumonia, shoulder abscess, and forearm cellulitis s/p I&D, and bilateral segmental PE. Doing well, continuing IV antibiotics, and slowly weaning oxygen.   PMH significant for recent admission for COVID (dc on 11/30), COPD on 2L home O2, HTN and HLD.    * Sepsis (HCC) s/t MRSA bacteremia Etiology septic thrombophlebitis vs septic endocarditis. Bcx NGTD. WBC stable from yesterday. Continues to be afebrile.  - Continue daptomycin and doxycycline  - ID consulted, appreciate recs - F/u Bcx  - TEE planned for 12/15.  - Vitals per floor protocol, continue tele  Acute on chronic respiratory failure (HCC) Patient now on 4L nasal cannula. On 2L home oxygen for COPD. Respiratory failure in the setting of cavitary pna and bilateral segmental pna.  - Continue abx as mentioned above  - Continue eliquis for PE  - Oxygen therapy as needed to maintain sats 88-92%  - Incentive spirometry  - Continue Brovana, atrovent, and pulmicort  Right shoulder pain S/p I&D with orthopedic surgery on 12/7.  - Tylenol 650 mg q6hrs  scheduled  - Continue daily iodoform dressing changes.   Hypertension Continues to be slightly hypertensive. Will take amlodipine another day or two to be in full effect. Home medication amlodipine 5 mg daily.  - Continue amlodipine 5 mg     FEN/GI: Dysphagia 2  PPx: On therapeutic eliquis for PE  Dispo:SNF Pending clinical improvement  Subjective:  Patient agrees she is much improved. She endorses slight shortness of breath but no chest pain.   Objective: Temp:  [97.5 F (36.4 C)-98.3 F (36.8 C)] 98.3 F (36.8 C) (12/13 0835) Pulse Rate:  [57-105] 57 (12/13 0835) Resp:  [16-18] 16 (12/13 0835) BP: (126-148)/(56-70) 148/56 (12/13 0835) SpO2:  [97 %-98 %] 98 % (12/13 0859) Physical Exam: General: Chronically ill, but improved from yesterday, able to pull herself up to sitting on her own  Cardiovascular: RRR, pulses equal and palpable, cap refill = 2 seconds Respiratory: No increased work of breathing on 4 L, diminished breath sounds on L mid lung and R base  Abdomen: soft, non tender, non distended  Extremities: No BLE edema   Laboratory: Most recent CBC Lab Results  Component Value Date   WBC 26.7 (H) 09/27/2022   HGB 10.2 (L) 09/27/2022   HCT 32.1 (L) 09/27/2022   MCV 93.0 09/27/2022   PLT 235 09/27/2022   Most recent BMP    Latest Ref Rng & Units 09/27/2022    5:41 AM  BMP  Glucose 70 - 99 mg/dL 557   BUN 8 - 23 mg/dL 29   Creatinine 3.22 - 1.00  mg/dL 2.23   Sodium 361 - 224 mmol/L 141   Potassium 3.5 - 5.1 mmol/L 4.1   Chloride 98 - 111 mmol/L 104   CO2 22 - 32 mmol/L 30   Calcium 8.9 - 10.3 mg/dL 8.7    Lockie Mola, MD 09/27/2022, 1:03 PM  PGY-1, [facility] FPTS Intern pager: [phone], text pages welcome Secure chat group [facility] Teaching Service

## 2022-09-27 NOTE — TOC Initial Note (Addendum)
Transition of Care [facility]) - Initial/Assessment Note    Patient Details  Name: Madeline Jimenez MRN: [id number] Date of Birth: 07-17-1935  Transition of Care [facility]) CM/SW Contact:    Lawerance Sabal, RN Phone Number: 09/27/2022, 10:11 AM  Clinical Narrative:                 10:11 Patient admitted from home. Lives with niece, Madeline Jimenez,  and niece's spouse who provide 24/7 supervision. PTA patient was independent, walked in the home w/o assistive device although has a RW and cane that she does not use. Patient also has home oxygen with [employer].  Madeline Jimenez states Madeline Jimenez was recently DC'd and started w PT OT w [facility] who saw once at the house prior to patient having to be readmitted. Discussed discharge planning and options with niece. Plan will be for Madeline Jimenez to come to the hospital tonight for her to assess if Yurika would need SNF or can return home with her support. We discussed need for IV Abx after DC and Madeline Jimenez states that she has had a PICC with home infusions in the past and is familiar with management.  Spoke w Madeline Jimenez, [facility] to follow for potential home infusion needs, as well as Madeline Jimenez with [facility] to follow for adding RN for infusions if decision is made for patient to return home at DC.  1430 Spoke w Madeline Jimenez who has just finished speaking w MD. She would like to move forward with SNF placement. Agreeable to be faxed out to [facility] with preference for [facility]. Updated CSW    Orvil Feil (Niece) [phone]      Barriers to Discharge: Continued Medical Work up   Patient Goals and CMS Choice        Expected Discharge Plan and Services     Discharge Planning Services: CM Consult   Living arrangements for the past 2 months: Single Family Home                                      Prior Living Arrangements/Services Living arrangements for the past 2 months: Single Family Home Lives with:: Relatives              Current home services: DME, Home  OT, Home PT    Activities of Daily Living      Permission Sought/Granted                  Emotional Assessment              Admission diagnosis:  Hypoxia [R09.02] Sepsis (HCC) [A41.9] Other elevated white blood cell (WBC) count [D72.828] Pneumonia of both upper lobes due to infectious organism [J18.9] COVID-19 [U07.1] Patient Active Problem List   Diagnosis Date Noted   Pneumonia of both upper lobes due to infectious organism 09/25/2022   Protein-calorie malnutrition, severe 09/24/2022   MRSA bacteremia 09/23/2022   Sepsis (HCC) s/t MRSA bacteremia 09/21/2022   Right shoulder pain 09/21/2022   Abscess of forearm, left 09/21/2022   Abscess of right shoulder 09/21/2022   COVID-19 09/12/2022   AKI (acute kidney injury) (HCC) 09/12/2022   Acute respiratory failure (HCC) 09/12/2022   Acute on chronic respiratory failure (HCC)    UTI (urinary tract infection) 03/07/2022   COPD exacerbation (HCC)    Stage 3a chronic kidney disease (CKD) (HCC)    Dependence on nocturnal oxygen therapy    Anemia 08/09/2014   Hypertension  05/14/2014   PCP:  Arnetha Courser, MD Pharmacy:   [facility] - [place], [place] - [address] [place] [address] Phone: [phone] Fax: [phone]  [facility] [address] [place] [address] Phone: [phone] Fax: [phone]     Social Determinants of Health (SDOH) Interventions    Readmission Risk Interventions    09/13/2022    2:11 PM  Readmission Risk Prevention Plan  Transportation Screening Complete  PCP or Specialist Appt within 5-7 Days Complete  Home Care Screening Complete  Medication Review (RN CM) Complete

## 2022-09-27 NOTE — Progress Notes (Signed)
FMTS Interim Progress Note  S: Dr. Velna Ochs to night round on patient. Patient comfortably asleep on arrival. Awakes easily. States she is not in pain. Breathing well.   O: BP (!) 140/54 (BP Location: Right Arm)   Pulse 67   Temp 98.1 F (36.7 C) (Oral)   Resp 18   Ht 5\' 4"  (1.626 m)   Wt 58.9 kg   SpO2 97%   BMI 22.29 kg/m   General: NAD, sleeping comfortably in hospital bed Cardiovascular: RRR, no murmurs, no peripheral edema Respiratory: normal WOB on 3L [place], decreased air movement in right lower lung field Extremities: Moving all 4 extremities equally   A/P: * Sepsis (HCC) s/t MRSA bacteremia  Etiology septic thrombophlebitis vs septic endocarditis. Continue to be afebrile. -Continue daptomycin and doxycycline -ID consulted, appreciate recs -Confirm TEE plans with ID/Cards in AM -F/u blood cultures  Acute on chronic respiratory failure (HCC)  Multifactorial in the setting of cavitary pneumonia, COPD, and acute PE. Saturating well on 3L [place]. Approaching baseline home oxygen of 2L. -Continue antibiotics as above -Continue Eliquis for PE -Continue to wean oxygen -Continue inhaler regimen (Brovana, atrovent, pulmicort) -Incentive spirometry  Remainder of plan per day team. Orders Reviewed.  , MD 09/27/2022, 9:57 PM PGY-1, [facility] Family Medicine Service pager [phone]

## 2022-09-27 NOTE — Progress Notes (Signed)
PHARMACY CONSULT NOTE FOR:  OUTPATIENT  PARENTERAL ANTIBIOTIC THERAPY (OPAT)  Informational only as current plan is to discharge to SNF and receive antibiotics there  Indication: MRSA bacteremia, presumed IE Regimen: Daptomycin 450 mg IV every 24 hours End date: 11/06/22   In addition, the patient will continue on Doxycycline to complete 2 weeks of coverage for pulmonary component thru 10/10/22  IV antibiotic discharge orders are pended. To discharging provider:  please sign these orders via discharge navigator,  Select New Orders & click on the button choice - Manage This Unsigned Work.     Thank you for allowing pharmacy to be a part of this patient's care.  Georgina Pillion, PharmD, BCPS Infectious Diseases Clinical Pharmacist 09/28/2022 11:29 AM   **Pharmacist phone directory can now be found on [email or web address] ([email or web address]).  Listed under [facility].

## 2022-09-27 NOTE — [place] FL2 (Signed)
[place] MEDICAID FL2 LEVEL OF CARE FORM     IDENTIFICATION  Patient Name: Madeline Jimenez Birthdate: [birth year] Sex: female Admission Date (Current Location): 09/20/2022  [facility] and [place] Number:  Producer, television/film/video and Address:  [facility], [address], [place], [place] [address]      Provider Number: [id number]  Attending Physician Name and Address:  Latrelle Dodrill, MD  Relative Name and Phone Number:  Darl Pikes  [phone]    Current Level of Care: Hospital Recommended Level of Care: Skilled Nursing Facility Prior Approval Number:    Date Approved/Denied:   PASRR Number: [id number]  Discharge Plan: SNF    Current Diagnoses: Patient Active Problem List   Diagnosis Date Noted   Pneumonia of both upper lobes due to infectious organism 09/25/2022   Protein-calorie malnutrition, severe 09/24/2022   MRSA bacteremia 09/23/2022   Sepsis (HCC) s/t MRSA bacteremia 09/21/2022   Right shoulder pain 09/21/2022   Abscess of forearm, left 09/21/2022   Abscess of right shoulder 09/21/2022   COVID-19 09/12/2022   Acute respiratory failure (HCC) 09/12/2022   Acute on chronic respiratory failure (HCC)    UTI (urinary tract infection) 03/07/2022   COPD exacerbation (HCC)    Stage 3a chronic kidney disease (CKD) (HCC)    Dependence on nocturnal oxygen therapy    Anemia 08/09/2014   Hypertension 05/14/2014    Orientation RESPIRATION BLADDER Height & Weight     Self, Time, Situation, Place  O2 ([place] 4L, PRN Bipap) Incontinent, External catheter Weight: 129 lb 13.6 oz (58.9 kg) Height:  5\' 4"  (162.6 cm)  BEHAVIORAL SYMPTOMS/MOOD NEUROLOGICAL BOWEL NUTRITION STATUS      Continent    AMBULATORY STATUS COMMUNICATION OF NEEDS Skin   Extensive Assist Verbally Surgical wounds, Skin abrasions (R shoulder and L arm incisions; L elbow skin tear)                       Personal Care Assistance Level of Assistance  Bathing, Feeding, Dressing  Bathing Assistance: Maximum assistance Feeding assistance: Limited assistance Dressing Assistance: Maximum assistance     Functional Limitations Info  Sight, Hearing, Speech Sight Info: Adequate Hearing Info: Impaired Speech Info: Adequate    SPECIAL CARE FACTORS FREQUENCY  PT (By licensed PT), OT (By licensed OT)     PT Frequency: 5x week OT Frequency: 5x week            Contractures Contractures Info: Not present    Additional Factors Info  Code Status, Allergies, Psychotropic, Isolation Precautions Code Status Info: Partial Allergies Info: NKA Psychotropic Info: Esictalopram   Isolation Precautions Info: MRSA     Current Medications (09/27/2022):  This is the current hospital active medication list Current Facility-Administered Medications  Medication Dose Route Frequency Provider Last Rate Last Admin   0.9 %  sodium chloride infusion  250 mL Intravenous Continuous 09/29/2022, MD   Stopped at 09/23/22 1042   0.9 %  sodium chloride infusion  250 mL Intravenous PRN 14/09/23, MD   Stopped at 09/24/22 2345   acetaminophen (TYLENOL) tablet 650 mg  650 mg Oral Q6H 14/10/23, MD   650 mg at 09/27/22 1253   amLODipine (NORVASC) tablet 5 mg  5 mg Oral Daily 09/29/22, MD   5 mg at 09/27/22 1017   apixaban (ELIQUIS) tablet 10 mg  10 mg Oral BID 09/29/22, MD   10 mg at 09/27/22 1026   Followed  by   Melene Muller ON 10/02/2022] apixaban (ELIQUIS) tablet 5 mg  5 mg Oral BID Cheri Fowler, MD       arformoterol (BROVANA) nebulizer solution 15 mcg  15 mcg Nebulization BID Celine Mans, Rahul P, PA-C   15 mcg at 09/27/22 0859   budesonide (PULMICORT) nebulizer solution 0.5 mg  0.5 mg Nebulization BID Celine Mans, Rahul P, PA-C   0.5 mg at 09/27/22 0859   Chlorhexidine Gluconate Cloth 2 % PADS 6 each  6 each Topical Q0600 Icard, Bradley L, DO   6 each at 09/27/22 0537   DAPTOmycin (CUBICIN) 450 mg in sodium chloride 0.9 % IVPB  8 mg/kg Intravenous Q2000 Gardiner Barefoot, MD   Stopped at 09/26/22 1056   doxycycline (VIBRAMYCIN) 100 mg in sodium chloride 0.9 % 250 mL IVPB  100 mg Intravenous Q12H Gardiner Barefoot, MD 125 mL/hr at 09/27/22 1019 100 mg at 09/27/22 1019   escitalopram (LEXAPRO) tablet 10 mg  10 mg Oral Daily Cheri Fowler, MD   10 mg at 09/27/22 1016   food thickener (SIMPLYTHICK (NECTAR/LEVEL 2/MILDLY THICK)) 5 packet  5 packet Oral PRN Cheri Fowler, MD       ipratropium (ATROVENT) nebulizer solution 0.5 mg  0.5 mg Nebulization BID Kalman Shan, MD   0.5 mg at 09/27/22 0859   ondansetron (ZOFRAN) injection 4 mg  4 mg Intravenous Q6H PRN Gaetana Michaelis, MD       Oral care mouth rinse  15 mL Mouth Rinse PRN Kalman Shan, MD       polyethylene glycol (MIRALAX / GLYCOLAX) packet 17 g  17 g Oral QHS Cheri Fowler, MD   17 g at 09/25/22 2124   senna-docusate (Senokot-S) tablet 1 tablet  1 tablet Oral Daily Cheri Fowler, MD   1 tablet at 09/27/22 1026   sodium chloride flush (NS) 0.9 % injection 3 mL  3 mL Intravenous Q12H Kalman Shan, MD   3 mL at 09/27/22 1021   sodium chloride flush (NS) 0.9 % injection 3 mL  3 mL Intravenous PRN Kalman Shan, MD       thiamine (VITAMIN B1) tablet 100 mg  100 mg Oral Daily Kalman Shan, MD   100 mg at 09/27/22 1016     Discharge Medications: Please see discharge summary for a list of discharge medications.  Relevant Imaging Results:  Relevant Lab Results:   Additional Information SS# [id number]

## 2022-09-28 LAB — BASIC METABOLIC PANEL
Anion gap: 7 (ref 5–15)
BUN: 26 mg/dL — ABNORMAL HIGH (ref 8–23)
CO2: 28 mmol/L (ref 22–32)
Calcium: 8.5 mg/dL — ABNORMAL LOW (ref 8.9–10.3)
Chloride: 106 mmol/L (ref 98–111)
Creatinine, Ser: 0.82 mg/dL (ref 0.44–1.00)
GFR, Estimated: >60 mL/min (ref >60)
Glucose, Bld: 97 mg/dL (ref 70–99)
Potassium: 4.9 mmol/L (ref 3.5–5.1)
Sodium: 141 mmol/L (ref 135–145)

## 2022-09-28 LAB — CULTURE, BLOOD (ROUTINE X 2): Culture: NO GROWTH

## 2022-09-28 LAB — CBC
HCT: 28.7 % — ABNORMAL LOW (ref 36.0–46.0)
Hemoglobin: 9.3 g/dL — ABNORMAL LOW (ref 12.0–15.0)
MCH: 30 pg (ref 26.0–34.0)
MCHC: 32.4 g/dL (ref 30.0–36.0)
MCV: 92.6 fL (ref 80.0–100.0)
Platelets: 242 10*3/uL (ref 150–400)
RBC: 3.1 MIL/uL — ABNORMAL LOW (ref 3.87–5.11)
RDW: 14.6 % (ref 11.5–15.5)
WBC: 30.4 10*3/uL — ABNORMAL HIGH (ref 4.0–10.5)
nRBC: 0.1 % (ref 0.0–0.2)

## 2022-09-28 MED ORDER — ALBUTEROL SULFATE (2.5 MG/3ML) 0.083% IN NEBU: 2.5000 mg | INHALATION_SOLUTION | RESPIRATORY_TRACT | Status: DC | PRN

## 2022-09-28 MED ORDER — DOXYCYCLINE HYCLATE 100 MG PO TABS
100.0000 mg | ORAL_TABLET | Freq: Two times a day (BID) | ORAL | Status: DC
  Administered 2022-09-28 – 2022-10-03 (×9): 100 mg via ORAL
  Filled 2022-09-28 (×11): qty 1

## 2022-09-28 NOTE — Progress Notes (Addendum)
Daily Progress Note Intern Pager: [phone]  Patient name: Madeline Jimenez Medical record number: [id number] Date of birth: [birth year] Age: 87 y.o. Gender: female  Primary Care Provider: Lorella Nimrod, MD Consultants: ID, PCCM s/o 12/11, Orthopedic Surgery s/o 12/10  Code Status: Partial, no CPR or meds ok for short term intubation   Pt Overview and Major Events to Date:  12/6 - Admitted, Started on Vancomycin  12/7 - Shoulder and left forearm I&D, taken to ICU after inability to extubate, Started on heparin gtt for segmental PE   12/10 - Extubated on nasal cannula  12/11 - Transitioned from heparin to eliquis for PE 12/12 - Transferred from ICU to floor, transitioned from vancomycin to daptomycin and doxycyline  Assessment and Plan: Madeline Jimenez is a 87 y.o. female who was admitted for acute on chronic respiratory failure, found to have MRSA bacteremia with cavitary pneumonia, shoulder abscess, and forearm cellulitis s/p I&D, and bilateral segmental PE. Doing well, continuing IV antibiotics, and slowly weaning oxygen.    PMH significant for recent admission for COVID (dc on 11/30), COPD on 2L home O2, HTN and HLD.  * Sepsis ([name]) s/t MRSA bacteremia Etiology septic thrombophlebitis vs septic endocarditis. Repeat  Bcx NGTD. Slightly elevated leukocytosis today; however, continues to be afebrile.  - Continue daptomycin and doxycycline  - ID consulted, appreciate recs - F/u Bcx  - Plan for PICC  - Vitals per floor protocol, continue tele  Acute on chronic respiratory failure ([name]) Greatly improving! Patient now on 3L nasal cannula. On 2L home oxygen for COPD. Respiratory failure in the setting of cavitary pna and bilateral segmental pna.  - Continue abx as mentioned above  - Continue eliquis for PE  - Oxygen therapy as needed to maintain sats 88-92%  - Incentive spirometry  - Continue Brovana, atrovent, and pulmicort - As needed albuterol   Right shoulder pain S/p I&D  with orthopedic surgery on 12/7.  - Tylenol 650 mg q6hrs scheduled  - Continue daily iodoform dressing changes.   Hypertension Continues to be hypertensive. Waiting for full effect of amlodipine.  - Continue amlodipine 5 mg     FEN/GI: Dys 1  PPx: On therapeutic eliquis for PE Dispo:SNF pending clinical improvement and possible placement of PICC  Subjective:  Patient says she feels well. Denies SOB, CP. Says she has no complaints.   Objective: Temp:  [98.1 F (36.7 C)-98.2 F (36.8 C)] 98.1 F (36.7 C) (12/14 0935) Pulse Rate:  [60-68] 67 (12/14 0935) Resp:  [16-20] 17 (12/14 0935) BP: (126-165)/(52-83) 158/77 (12/14 0935) SpO2:  [95 %-99 %] 98 % (12/14 0935) Physical Exam: General: Chronically ill, but improved appearance  Cardiovascular: RRR, well perfused  Respiratory: CTAB, no increased work of breathing on 3 L  Abdomen: Soft, non tender, non distended  Extremities: No BLE edema   Laboratory: Most recent CBC Lab Results  Component Value Date   WBC 30.4 (H) 09/28/2022   HGB 9.3 (L) 09/28/2022   HCT 28.7 (L) 09/28/2022   MCV 92.6 09/28/2022   PLT 242 09/28/2022   Most recent BMP    Latest Ref Rng & Units 09/28/2022    2:41 AM  BMP  Glucose 70 - 99 mg/dL 97   BUN 8 - 23 mg/dL 26   Creatinine 0.44 - 1.00 mg/dL 0.82   Sodium 135 - 145 mmol/L 141   Potassium 3.5 - 5.1 mmol/L 4.9   Chloride 98 - 111 mmol/L 106   CO2 22 - 32  mmol/L 28   Calcium 8.9 - 10.3 mg/dL 8.5     Madeline Mola, MD 09/28/2022, 3:43 PM  PGY-1, [facility] FPTS Intern pager: [phone], text pages welcome Secure chat group [facility] Teaching Service

## 2022-09-28 NOTE — Progress Notes (Signed)
Brief Interim Family   Ms. Katrinka Blazing Child psychotherapist informed patient's niece had many questions regarding patient's medical care and was concerned that discharge was near.  Called patient's niece and asked if she had any medical questions.  Patient's niece asked if we still planned to do TEE tomorrow.  I informed Ms. Darl Pikes that we will not be doing TEE as an this patient's case sepsis is presumed to be caused by either by septic thrombophlebitis or septic endocarditis.  I also mentioned that we have been talking with infectious disease daily regarding her aunt's care.  I mentioned that TEE comes with its own risks and if management is going to stay the same that we would like to prevent unnecessary risk to patient.  She was agreeable.  I also mentioned that patient had a echo done which showed normal EF though she had grade 1 diastolic dysfunction.  I mentioned that endocarditis or valvular vegetation was not seen on this echo however it was not definitive.  Niece also questioned regarding respiratory status.  I mentioned that patient's lungs have been improving quite well.  I said that now she is on 3 L of oxygen as she is on 2 L of oxygen at home.  Niece asked about COVID and if patient is still suffering from COVID.  I mentioned that patient's respiratory status was most likely altered due to bilateral segmental PEs and cavitary pneumonia from MRSA.  I mentioned that she has been getting antibiotics and has improved.  Also mentioned that she has been getting anticoagulation for her PE.  I mentioned that I was one of the doctors taking care of her on during her previous admission and respiratory status seems much improved.  Niece mentioned that she was very frustrated that patient was discharged last admission and mentioned that she had seen a bump on her forearm on discharge last admission.  I mentioned that unfortunately we did not have any photos of this bump.  Usually we take photos in case there is a  dermatologic finding.  I mentioned that it was possible that her onset IV had infiltrated and that was suspected rather than cellulitis at that time.  She responded with concern that patient would be discharged too soon this admission and that patient would get worse after leaving.  I alleviated her concerns by telling the niece that patient would continue to get antibiotics when discharged and would be getting labs to check her progress.  I also mention that respiratory status was improving.  I also let her know that patient did not remain in the hospital due to quarantine as we do not usually keep patients even if they are within the 21-day window.  She was placed on 21-day precautions that she had increased oxygen requirement however this could have also been caused by bilateral segmental PEs or her cavitary pneumonia.  She is also at her 21-day mark today.  I let Ms. Darl Pikes know that she could always ask [place] any more questions.  Patient's niece in agreement with the plan and did not have any further questions.  Lockie Mola  PGY-1 [facility]

## 2022-09-28 NOTE — TOC Progression Note (Signed)
Transition of Care [facility]) - Progression Note    Patient Details  Name: Siniya Lichty MRN: [id number] Date of Birth: 1935-04-28  [facility] CM/SW Contact  Carley Hammed, [place] Phone Number: 09/28/2022, 3:55 PM  Clinical Narrative:    CSW followed up with Niece and bed offers and Medicare ratings were provided. Niece notes a preference for [place] or [place] as they are close to the house. CSW followed up with [facility] and they do not have beds available. CSW requested clarification from MD and was advised pt could possibly DC tomorrow. CSW updated Niece and she noted major concerns about pt's medical stability and requested a follow up from MD. CSW notified medical team and will continue to follow for DC planning.     Barriers to Discharge: Continued Medical Work up  Expected Discharge Plan and Services     Discharge Planning Services: CM Consult   Living arrangements for the past 2 months: Single Family Home                                       Social Determinants of Health (SDOH) Interventions    Readmission Risk Interventions    09/13/2022    2:11 PM  Readmission Risk Prevention Plan  Transportation Screening Complete  PCP or Specialist Appt within 5-7 Days Complete  Home Care Screening Complete  Medication Review (RN CM) Complete

## 2022-09-28 NOTE — Progress Notes (Signed)
[place] [facility] Infectious Disease   Reason for visit: Follow up on bacteremia  Interval History: repeat blood cultures ngtd from 12/12, 1/4 bottles positive from 12/9.    Day 9 total antibiotics  Physical Exam: Constitutional:  Vitals:   09/28/22 0803 09/28/22 0935  BP:  (!) 158/77  Pulse:  67  Resp:  17  Temp:  98.1 F (36.7 C)  SpO2: 99% 98%   patient appears in NAD Eyes: anicteric HENT: + nasal cannula Respiratory: Normal respiratory effort  Review of Systems: Constitutional: negative for fevers and chills  Lab Results  Component Value Date   WBC 30.4 (H) 09/28/2022   HGB 9.3 (L) 09/28/2022   HCT 28.7 (L) 09/28/2022   MCV 92.6 09/28/2022   PLT 242 09/28/2022    Lab Results  Component Value Date   CREATININE 0.82 09/28/2022   BUN 26 (H) 09/28/2022   NA 141 09/28/2022   K 4.9 09/28/2022   CL 106 09/28/2022   CO2 28 09/28/2022    Lab Results  Component Value Date   ALT 76 (H) 09/26/2022   AST 68 (H) 09/26/2022   ALKPHOS 77 09/26/2022     Microbiology: Recent Results (from the past 240 hour(s))  Blood Culture (routine x 2)     Status: Abnormal   Collection Time: 09/20/22 10:08 PM   Specimen: BLOOD  Result Value Ref Range Status   Specimen Description BLOOD RIGHT ANTECUBITAL  Final   Special Requests   Final    BOTTLES DRAWN AEROBIC AND ANAEROBIC Blood Culture results may not be optimal due to an excessive volume of blood received in culture bottles   Culture  Setup Time   Final    GRAM POSITIVE COCCI IN CLUSTERS IN BOTH AEROBIC AND ANAEROBIC BOTTLES CRITICAL RESULT CALLED TO, READ BACK BY AND VERIFIED WITH: PHARMD E MARTIN [id number] AT 1452 BY CM Performed at [facility], [address], [place], [place] [address]    Culture METHICILLIN RESISTANT STAPHYLOCOCCUS AUREUS (A)  Final   Report Status 09/23/2022 FINAL  Final   Organism ID, Bacteria METHICILLIN RESISTANT STAPHYLOCOCCUS AUREUS  Final      Susceptibility   Methicillin resistant  staphylococcus aureus - MIC*    CIPROFLOXACIN <=0.5 SENSITIVE Sensitive     ERYTHROMYCIN >=8 RESISTANT Resistant     GENTAMICIN <=0.5 SENSITIVE Sensitive     OXACILLIN >=4 RESISTANT Resistant     TETRACYCLINE <=1 SENSITIVE Sensitive     VANCOMYCIN 1 SENSITIVE Sensitive     TRIMETH/SULFA <=10 SENSITIVE Sensitive     CLINDAMYCIN <=0.25 SENSITIVE Sensitive     RIFAMPIN <=0.5 SENSITIVE Sensitive     Inducible Clindamycin NEGATIVE Sensitive     * METHICILLIN RESISTANT STAPHYLOCOCCUS AUREUS  Blood Culture ID Panel (Reflexed)     Status: Abnormal   Collection Time: 09/20/22 10:08 PM  Result Value Ref Range Status   Enterococcus faecalis NOT DETECTED NOT DETECTED Final   Enterococcus Faecium NOT DETECTED NOT DETECTED Final   Listeria monocytogenes NOT DETECTED NOT DETECTED Final   Staphylococcus species DETECTED (A) NOT DETECTED Final    Comment: CRITICAL RESULT CALLED TO, READ BACK BY AND VERIFIED WITH: PHARMD E MARTIN [id number] AT 1452 BY CM    Staphylococcus aureus (BCID) DETECTED (A) NOT DETECTED Final    Comment: Methicillin (oxacillin)-resistant Staphylococcus aureus (MRSA). MRSA is predictably resistant to beta-lactam antibiotics (except ceftaroline). Preferred therapy is vancomycin unless clinically contraindicated. Patient requires contact precautions if  hospitalized. CRITICAL RESULT CALLED TO,  READ BACK BY AND VERIFIED WITH: PHARMD E MARTIN [id number] AT 1452 BY CM    Staphylococcus epidermidis NOT DETECTED NOT DETECTED Final   Staphylococcus lugdunensis NOT DETECTED NOT DETECTED Final   Streptococcus species NOT DETECTED NOT DETECTED Final   Streptococcus agalactiae NOT DETECTED NOT DETECTED Final   Streptococcus pneumoniae NOT DETECTED NOT DETECTED Final   Streptococcus pyogenes NOT DETECTED NOT DETECTED Final   A.calcoaceticus-baumannii NOT DETECTED NOT DETECTED Final   Bacteroides fragilis NOT DETECTED NOT DETECTED Final   Enterobacterales NOT DETECTED NOT DETECTED Final    Enterobacter cloacae complex NOT DETECTED NOT DETECTED Final   Escherichia coli NOT DETECTED NOT DETECTED Final   Klebsiella aerogenes NOT DETECTED NOT DETECTED Final   Klebsiella oxytoca NOT DETECTED NOT DETECTED Final   Klebsiella pneumoniae NOT DETECTED NOT DETECTED Final   Proteus species NOT DETECTED NOT DETECTED Final   Salmonella species NOT DETECTED NOT DETECTED Final   Serratia marcescens NOT DETECTED NOT DETECTED Final   Haemophilus influenzae NOT DETECTED NOT DETECTED Final   Neisseria meningitidis NOT DETECTED NOT DETECTED Final   Pseudomonas aeruginosa NOT DETECTED NOT DETECTED Final   Stenotrophomonas maltophilia NOT DETECTED NOT DETECTED Final   Candida albicans NOT DETECTED NOT DETECTED Final   Candida auris NOT DETECTED NOT DETECTED Final   Candida glabrata NOT DETECTED NOT DETECTED Final   Candida krusei NOT DETECTED NOT DETECTED Final   Candida parapsilosis NOT DETECTED NOT DETECTED Final   Candida tropicalis NOT DETECTED NOT DETECTED Final   Cryptococcus neoformans/gattii NOT DETECTED NOT DETECTED Final   Meth resistant mecA/C and MREJ DETECTED (A) NOT DETECTED Final    Comment: CRITICAL RESULT CALLED TO, READ BACK BY AND VERIFIED WITH: PHARMD E MARTIN [id number] AT 1452 BY CM Performed at [facility], [address], [place], [place] [address]   Surgical pcr screen     Status: Abnormal   Collection Time: 09/21/22  4:57 PM   Specimen: Nasal Mucosa; Nasal Swab  Result Value Ref Range Status   MRSA, PCR POSITIVE (A) NEGATIVE Final    Comment: EMAILED LINDSI FORTE ON 09/21/22 @ 2004 BY DRT   Staphylococcus aureus POSITIVE (A) NEGATIVE Final    Comment: (NOTE) The Xpert SA Assay (FDA approved for NASAL specimens in patients 7 years of age and older), is one component of a comprehensive surveillance program. It is not intended to diagnose infection nor to guide or monitor treatment. Performed at [facility], [place] [address] [place], [place] [address]    Aerobic/Anaerobic Culture w Gram Stain (surgical/deep wound)     Status: None   Collection Time: 09/21/22  6:27 PM   Specimen: PATH Other; Tissue  Result Value Ref Range Status   Specimen Description WOUND LEFT ARM  Final   Special Requests LEFT FOREARM PT ON ANCEF  Final   Gram Stain   Final    RARE WBC PRESENT, PREDOMINANTLY MONONUCLEAR RARE GRAM POSITIVE COCCI IN SINGLES    Culture   Final    FEW STAPHYLOCOCCUS AUREUS SUSCEPTIBILITIES PERFORMED ON PREVIOUS CULTURE WITHIN THE LAST 5 DAYS. NO ANAEROBES ISOLATED Performed at [facility], [place] [address], [place],  [place] [address]    Report Status 09/27/2022 FINAL  Final  Acid Fast Smear (AFB)     Status: None   Collection Time: 09/21/22  6:27 PM   Specimen: PATH Other; Tissue  Result Value Ref Range Status   AFB Specimen Processing Concentration  Final   Acid Fast Smear  Negative  Final    Comment: (NOTE) Performed At: [facility] [place], [place] [id number] Rush Farmer MD [id number]    Source (AFB) WOUND  Corrected    Comment: LEFT ARM Performed at [facility], [place] [address] [place], [place] [address] CORRECTED ON 12/07 AT 2125: PREVIOUSLY REPORTED AS TISSUE LEFT ARM   Aerobic/Anaerobic Culture w Gram Stain (surgical/deep wound)     Status: None   Collection Time: 09/21/22  6:28 PM   Specimen: PATH Other; Tissue  Result Value Ref Range Status   Specimen Description TISSUE LEFT ARM  Final   Special Requests LEFT FOREARM PT ON ANCEF SAMPLE  Final   Gram Stain   Final    RARE WBC PRESENT, PREDOMINANTLY MONONUCLEAR NO ORGANISMS SEEN    Culture   Final    RARE STAPHYLOCOCCUS AUREUS SUSCEPTIBILITIES PERFORMED ON PREVIOUS CULTURE WITHIN THE LAST 5 DAYS. NO ANAEROBES ISOLATED Performed at [facility], [place] [address] [place], [place] [address]    Report Status 09/27/2022 FINAL  Final  Acid Fast Smear (AFB)     Status: None   Collection Time: 09/21/22  6:28 PM    Specimen: PATH Other; Tissue  Result Value Ref Range Status   AFB Specimen Processing Comment  Final    Comment: Tissue Grinding and Digestion/Decontamination   Acid Fast Smear Negative  Final    Comment: (NOTE) Performed At: [facility] [place], [place] [id number] Rush Farmer MD [id number]    Source (AFB) TISSUE  Final    Comment: LEFT ARM FOREARMFPREARM Performed at [facility], [place] [address], [place], [place] [address]   Fungus Culture With Stain     Status: None (Preliminary result)   Collection Time: 09/21/22  6:28 PM   Specimen: PATH Other; Tissue  Result Value Ref Range Status   Fungus Stain Final report  Final    Comment: (NOTE) Performed At: [facility] [place], [place] [id number] Rush Farmer MD [id number]    Fungus (Mycology) Culture PENDING  Incomplete   Fungal Source TISSUE  Final    Comment: LEFT ARM FOREARM Performed at [facility], [place] [address], [place], [place] [address]   Fungus Culture With Stain     Status: None (Preliminary result)   Collection Time: 09/21/22  6:28 PM   Specimen: PATH Other; Tissue  Result Value Ref Range Status   Fungus Stain Final report  Final    Comment: (NOTE) Performed At: [facility] [place], [place] [id number] Rush Farmer MD [id number]    Fungus (Mycology) Culture PENDING  Incomplete   Fungal Source SHOULDER  Final    Comment: RIGHT Performed at [facility], [place] [address], [place], [place] [address]   Aerobic/Anaerobic Culture w Gram Stain (surgical/deep wound)     Status: None   Collection Time: 09/21/22  6:28 PM   Specimen: PATH Other; Tissue  Result Value Ref Range Status   Specimen Description WOUND RIGHT SHOULDER  Final   Special Requests SWAB PT ON ANCEF  Final   Gram Stain   Final    RARE WBC PRESENT, PREDOMINANTLY MONONUCLEAR FEW GRAM POSITIVE COCCI IN CLUSTERS    Culture   Final    ABUNDANT  METHICILLIN RESISTANT STAPHYLOCOCCUS AUREUS NO ANAEROBES ISOLATED Performed at [facility], [place] [address] [place], [place] [address]    Report Status 09/27/2022 FINAL  Final   Organism  ID, Bacteria METHICILLIN RESISTANT STAPHYLOCOCCUS AUREUS  Final      Susceptibility   Methicillin resistant staphylococcus aureus - MIC*    CIPROFLOXACIN <=0.5 SENSITIVE Sensitive     ERYTHROMYCIN >=8 RESISTANT Resistant     GENTAMICIN <=0.5 SENSITIVE Sensitive     OXACILLIN >=4 RESISTANT Resistant     TETRACYCLINE <=1 SENSITIVE Sensitive     VANCOMYCIN <=0.5 SENSITIVE Sensitive     TRIMETH/SULFA <=10 SENSITIVE Sensitive     CLINDAMYCIN <=0.25 SENSITIVE Sensitive     RIFAMPIN <=0.5 SENSITIVE Sensitive     Inducible Clindamycin NEGATIVE Sensitive     * ABUNDANT METHICILLIN RESISTANT STAPHYLOCOCCUS AUREUS  Acid Fast Smear (AFB)     Status: None   Collection Time: 09/21/22  6:28 PM   Specimen: PATH Other; Tissue  Result Value Ref Range Status   AFB Specimen Processing Concentration  Final   Acid Fast Smear Negative  Final    Comment: (NOTE) Performed At: [facility] [place], [place] [id number] Rush Farmer MD [id number]    Source (AFB) SHOULDER  Final    Comment: RIGHT Performed at [facility], [place] [address] [place], [place] [address]   Fungus Culture Result     Status: None   Collection Time: 09/21/22  6:28 PM  Result Value Ref Range Status   Result 1 Comment  Final    Comment: (NOTE) KOH/Calcofluor preparation:  no fungus observed. Performed At: [facility] [place], [place] [id number] Rush Farmer MD [id number]   Fungus Culture Result     Status: None   Collection Time: 09/21/22  6:28 PM  Result Value Ref Range Status   Result 1 Comment  Final    Comment: (NOTE) KOH/Calcofluor preparation:  no fungus observed. Performed At: [facility] [place], [place] [id number] Rush Farmer MD  [id number]   Fungus Culture With Stain     Status: None (Preliminary result)   Collection Time: 09/21/22  6:44 PM   Specimen: PATH Other; Tissue  Result Value Ref Range Status   Fungus Stain Final report  Final    Comment: (NOTE) Performed At: [facility] [place], [place] [id number] Rush Farmer MD [id number]    Fungus (Mycology) Culture PENDING  Incomplete   Fungal Source TISSUE  Final    Comment: RIGHT SHOULDER NO1 Performed at [facility], [place] [address] [place], [place] [address]   Aerobic/Anaerobic Culture w Gram Stain (surgical/deep wound)     Status: None   Collection Time: 09/21/22  6:44 PM   Specimen: PATH Other; Tissue  Result Value Ref Range Status   Specimen Description TISSUE RIGHT SHOULDER NO 1  Final   Special Requests PT ON ANCEF  Final   Gram Stain   Final    RARE WBC PRESENT, PREDOMINANTLY MONONUCLEAR FEW GRAM POSITIVE COCCI IN CLUSTERS    Culture   Final    ABUNDANT STAPHYLOCOCCUS AUREUS SUSCEPTIBILITIES PERFORMED ON PREVIOUS CULTURE WITHIN THE LAST 5 DAYS. NO ANAEROBES ISOLATED Performed at [facility], [place] [address] [place], [place] [address]    Report Status 09/27/2022 FINAL  Final  Aerobic/Anaerobic Culture w Gram Stain (surgical/deep wound)     Status: None   Collection Time: 09/21/22  6:44 PM   Specimen: PATH Other; Tissue  Result Value Ref Range Status   Specimen Description TISSUE RIGHT SHOULDER  Final   Special Requests SAMPLE NO 2 PT ON ANCEF  Final  Gram Stain   Final    RARE WBC PRESENT,BOTH PMN AND MONONUCLEAR FEW GRAM POSITIVE COCCI IN CLUSTERS    Culture   Final    MODERATE STAPHYLOCOCCUS AUREUS SUSCEPTIBILITIES PERFORMED ON PREVIOUS CULTURE WITHIN THE LAST 5 DAYS. NO ANAEROBES ISOLATED Performed at [facility], [place] [address] [place], [place] [address]    Report Status 09/27/2022 FINAL  Final  Acid Fast Smear (AFB)     Status: None   Collection Time: 09/21/22  6:44 PM    Specimen: PATH Other; Tissue  Result Value Ref Range Status   AFB Specimen Processing Comment  Final    Comment: Tissue Grinding and Digestion/Decontamination   Acid Fast Smear Negative  Final    Comment: (NOTE) Performed At: [facility] [place], [place] [id number] Rush Farmer MD [id number]    Source (AFB) TISSUE  Corrected    Comment: RT SHOULDER NO 2 Performed at [facility], [place] [address] [place], [place] [address] CORRECTED ON 12/08 AT 0049: PREVIOUSLY REPORTED AS WOUND RT SHOULDER SWAB   Acid Fast Smear (AFB)     Status: None   Collection Time: 09/21/22  6:44 PM   Specimen: PATH Other; Tissue  Result Value Ref Range Status   AFB Specimen Processing Comment  Final    Comment: Tissue Grinding and Digestion/Decontamination   Acid Fast Smear Negative  Final    Comment: (NOTE) Performed At: [facility] [place], [place] [id number] Rush Farmer MD YB:[id number]    Source (AFB) TISSUE  Final    Comment: RIGHT SHOULDER NO1 Performed at [facility], [place] [address] [place], [place] [address]   Fungus Culture Result     Status: None   Collection Time: 09/21/22  6:44 PM  Result Value Ref Range Status   Result 1 Comment  Final    Comment: (NOTE) KOH/Calcofluor preparation:  no fungus observed. Performed At: [facility] [place], [place] [id number] Rush Farmer MD HR:[id number]   MRSA Next Gen by PCR, Nasal     Status: Abnormal   Collection Time: 09/21/22  8:42 PM   Specimen: Nasal Mucosa; Nasal Swab  Result Value Ref Range Status   MRSA by PCR Next Gen DETECTED (A) NOT DETECTED Final    Comment: RESULT CALLED TO, READ BACK BY AND VERIFIED WITH:  E HUDSON,RN_0  09/22/22 [place] (NOTE) The GeneXpert MRSA Assay (FDA approved for NASAL specimens only), is one component of a comprehensive MRSA colonization surveillance program. It is not intended to diagnose MRSA infection nor to  guide or monitor treatment for MRSA infections. Test performance is not FDA approved in patients less than 5 years old. Performed at [facility], [place] [address] [place], [place] [address]   Culture, blood (Routine X 2) w Reflex to ID Panel     Status: None (Preliminary result)   Collection Time: 09/23/22  7:58 AM   Specimen: BLOOD RIGHT HAND  Result Value Ref Range Status   Specimen Description BLOOD RIGHT HAND  Final   Special Requests   Final    BOTTLES DRAWN AEROBIC AND ANAEROBIC Blood Culture results may not be optimal due to an inadequate volume of blood received in culture bottles   Culture   Final    NO GROWTH 4 DAYS Performed at [facility], [place] [address], [place], [place] [address]    Report Status PENDING  Incomplete  Culture, blood (Routine X 2) w  Reflex to ID Panel     Status: Abnormal   Collection Time: 09/23/22  7:58 AM   Specimen: BLOOD RIGHT HAND  Result Value Ref Range Status   Specimen Description BLOOD RIGHT HAND  Final   Special Requests   Final    BOTTLES DRAWN AEROBIC AND ANAEROBIC Blood Culture results may not be optimal due to an inadequate volume of blood received in culture bottles   Culture  Setup Time   Final    GRAM POSITIVE COCCI IN CLUSTERS ANAEROBIC BOTTLE ONLY CRITICAL RESULT CALLED TO, READ BACK BY AND VERIFIED WITH: Anabel Bene [id number] FCP Performed at [place] [facility], [place] [address], [place], [place] [address]    Culture METHICILLIN RESISTANT STAPHYLOCOCCUS AUREUS (A)  Final   Report Status 09/28/2022 FINAL  Final   Organism ID, Bacteria METHICILLIN RESISTANT STAPHYLOCOCCUS AUREUS  Final      Susceptibility   Methicillin resistant staphylococcus aureus - MIC*    CIPROFLOXACIN <=0.5 SENSITIVE Sensitive     ERYTHROMYCIN >=8 RESISTANT Resistant     GENTAMICIN <=0.5 SENSITIVE Sensitive     OXACILLIN >=4 RESISTANT Resistant     TETRACYCLINE <=1 SENSITIVE Sensitive     VANCOMYCIN <=0.5 SENSITIVE Sensitive      TRIMETH/SULFA <=10 SENSITIVE Sensitive     CLINDAMYCIN <=0.25 SENSITIVE Sensitive     RIFAMPIN <=0.5 SENSITIVE Sensitive     Inducible Clindamycin NEGATIVE Sensitive     * METHICILLIN RESISTANT STAPHYLOCOCCUS AUREUS  Culture, blood (Routine X 2) w Reflex to ID Panel     Status: None (Preliminary result)   Collection Time: 09/26/22 12:06 PM   Specimen: BLOOD  Result Value Ref Range Status   Specimen Description BLOOD RIGHT ANTECUBITAL  Final   Special Requests   Final    BOTTLES DRAWN AEROBIC AND ANAEROBIC Blood Culture adequate volume   Culture   Final    NO GROWTH 1 DAY Performed at [facility], [address], [place], [place] [address]    Report Status PENDING  Incomplete  Culture, blood (Routine X 2) w Reflex to ID Panel     Status: None (Preliminary result)   Collection Time: 09/26/22 12:08 PM   Specimen: BLOOD  Result Value Ref Range Status   Specimen Description BLOOD RIGHT ANTECUBITAL  Final   Special Requests   Final    BOTTLES DRAWN AEROBIC ONLY Blood Culture adequate volume   Culture   Final    NO GROWTH 1 DAY Performed at [facility], [place] [address] [place],  [address]    Report Status PENDING  Incomplete    Impression/Plan:  1. MRSA bacteremia - this is associated with persistently positive blood culture x 1 follow up set, possible septic pulmonary emboli and leukocytosis.  With that, I recommend 6 weeks of treatment including pulmonary coverage for 2 weeks.  2.  OPAT - will put in opat order   3.  COVID - post treatment and now near her home oxygen requirements.    Diagnosis: MRSA bacteremia  Culture Result: MRSA  No Known Allergies  OPAT Orders Discharge antibiotics to be given via PICC line Discharge antibiotics: daptomycin 10m/kg IV once daily + doxycycline 100 mg po bid  Per pharmacy protocol yes Duration: Doxycycline 2 weeks, daptomycin 6 weeks with consideration of earlier conversion to oral therapy End Date: 12/26 for  doxycycline 11/06/21 for daptomycin  [facility]  Home health RN for IV administration and teaching; PICC line  care and labs.    Labs weekly while on IV antibiotics: _x_ CBC with differential __ BMP _x_ CMP __ CRP __ ESR __ Vancomycin trough _x_ CK  _x_ Please pull PIC at completion of IV antibiotics __ Please leave PIC in place until doctor has seen patient or been notified  Fax weekly labs to [phone]  Clinic Follow Up Appt: 10/27/22  @ 11:15

## 2022-09-29 ENCOUNTER — Inpatient Hospital Stay: Payer: Self-pay

## 2022-09-29 ENCOUNTER — Encounter (HOSPITAL_COMMUNITY)
Admission: EM | Disposition: A | Discharge status: Skilled Nursing Facility | Payer: Self-pay | Source: Home / Self Care | Attending: Family Medicine

## 2022-09-29 DIAGNOSIS — R7989 Other specified abnormal findings of blood chemistry: Secondary | ICD-10-CM

## 2022-09-29 LAB — CBC WITH DIFFERENTIAL/PLATELET
Abs Immature Granulocytes: 0.57 10*3/uL — ABNORMAL HIGH (ref 0.00–0.07)
Basophils Absolute: 0.1 10*3/uL (ref 0.0–0.1)
Basophils Relative: 0 %
Eosinophils Absolute: 0.1 10*3/uL (ref 0.0–0.5)
Eosinophils Relative: 0 %
HCT: 28.8 % — ABNORMAL LOW (ref 36.0–46.0)
Hemoglobin: 9.2 g/dL — ABNORMAL LOW (ref 12.0–15.0)
Immature Granulocytes: 2 %
Lymphocytes Relative: 56 %
Lymphs Abs: 16.3 10*3/uL — ABNORMAL HIGH (ref 0.7–4.0)
MCH: 30.1 pg (ref 26.0–34.0)
MCHC: 31.9 g/dL (ref 30.0–36.0)
MCV: 94.1 fL (ref 80.0–100.0)
Monocytes Absolute: 1.6 10*3/uL — ABNORMAL HIGH (ref 0.1–1.0)
Monocytes Relative: 6 %
Neutro Abs: 10.4 10*3/uL — ABNORMAL HIGH (ref 1.7–7.7)
Neutrophils Relative %: 36 %
Platelets: 258 10*3/uL (ref 150–400)
RBC: 3.06 MIL/uL — ABNORMAL LOW (ref 3.87–5.11)
RDW: 14.6 % (ref 11.5–15.5)
WBC: 29 10*3/uL — ABNORMAL HIGH (ref 4.0–10.5)
nRBC: 0 % (ref 0.0–0.2)

## 2022-09-29 LAB — HEPATITIS PANEL, ACUTE
HCV Ab: NONREACTIVE
Hep A IgM: NONREACTIVE
Hep B C IgM: NONREACTIVE
Hepatitis B Surface Ag: NONREACTIVE

## 2022-09-29 LAB — COMPREHENSIVE METABOLIC PANEL
ALT: 139 U/L — ABNORMAL HIGH (ref 0–44)
AST: 98 U/L — ABNORMAL HIGH (ref 15–41)
Albumin: 1.8 g/dL — ABNORMAL LOW (ref 3.5–5.0)
Alkaline Phosphatase: 82 U/L (ref 38–126)
Anion gap: 7 (ref 5–15)
BUN: 24 mg/dL — ABNORMAL HIGH (ref 8–23)
CO2: 31 mmol/L (ref 22–32)
Calcium: 8.5 mg/dL — ABNORMAL LOW (ref 8.9–10.3)
Chloride: 104 mmol/L (ref 98–111)
Creatinine, Ser: 0.99 mg/dL (ref 0.44–1.00)
GFR, Estimated: 56 mL/min — ABNORMAL LOW (ref >60)
Glucose, Bld: 108 mg/dL — ABNORMAL HIGH (ref 70–99)
Potassium: 4.3 mmol/L (ref 3.5–5.1)
Sodium: 142 mmol/L (ref 135–145)
Total Bilirubin: 0.4 mg/dL (ref 0.3–1.2)
Total Protein: 5.1 g/dL — ABNORMAL LOW (ref 6.5–8.1)

## 2022-09-29 LAB — TECHNOLOGIST SMEAR REVIEW

## 2022-09-29 SURGERY — ECHOCARDIOGRAM, TRANSESOPHAGEAL
Anesthesia: Monitor Anesthesia Care

## 2022-09-29 MED ORDER — OXYCODONE HCL 5 MG PO TABS
2.5000 mg | ORAL_TABLET | Freq: Four times a day (QID) | ORAL | Status: DC | PRN
  Administered 2022-09-30 – 2022-10-02 (×3): 2.5 mg via ORAL
  Filled 2022-09-29 (×3): qty 1

## 2022-09-29 MED ORDER — ACETAMINOPHEN 325 MG PO TABS
650.0000 mg | ORAL_TABLET | Freq: Four times a day (QID) | ORAL | Status: DC | PRN
  Administered 2022-09-29 – 2022-10-01 (×4): 650 mg via ORAL
  Filled 2022-09-29 (×7): qty 2

## 2022-09-29 NOTE — TOC Progression Note (Addendum)
Transition of Care [facility]) - Progression Note    Patient Details  Name: Madeline Jimenez MRN: [id number] Date of Birth: Apr 23, 1935  [facility] CM/SW Contact  Carley Hammed, [place] Phone Number: 09/29/2022, 10:59 AM  Clinical Narrative:     CSW notified of pt possibly discharging today. Niece has chosen [facility], as their first choice, [facility] beds. [facility] notified. CSW will need a new PT note to start authorization, last one charted on 12/11 is too old. PT notified and will see her shortly. TOC will continue to follow for DC needs.  2:00 CSW notified by medical team that pt is NMR to DC, and can DC Monday to facility of Preference. [facility] confirmed with [facility] will have a bed, authorization started. Niece notified and agreeable to plan. Plan to DC Monday if medically stable. TOC will continue to follow for DC needs.   Barriers to Discharge: Continued Medical Work up  Expected Discharge Plan and Services     Discharge Planning Services: CM Consult   Living arrangements for the past 2 months: Single Family Home                                       Social Determinants of Health (SDOH) Interventions    Readmission Risk Interventions    09/13/2022    2:11 PM  Readmission Risk Prevention Plan  Transportation Screening Complete  PCP or Specialist Appt within 5-7 Days Complete  Home Care Screening Complete  Medication Review (RN CM) Complete

## 2022-09-29 NOTE — Progress Notes (Signed)
Physical Therapy Treatment Patient Details Name: Madeline Jimenez MRN: [id number] DOB: Feb 28, 1935 Today's Date: 09/29/2022   History of Present Illness 87 yo female presents to [facility] on 12/6 with ShOB and R shoulder pain, ShOB secondary to PNA from recent covid infection (hospitalized 11/28-11/30). Cultures positive for MRSA bacteremia from clavicular abscess with incidental PE as well as lung consolidation with areas of cavitation found on CT.S/p I&D R shoulder abscess, L forearm on 12/7. ETT 12/7-12/10. PMH includes COPD, HTN, HLD, PVD.    PT Comments    Pt was seen for progressing mobility and to work on first session of gait with PT this admission.  Pt was ambulatory prior to this and will anticipate her being able to progress given PLOF and motivation despite weakness.  Follow up with her to increase strength and balance for return to home, after stay in SNF for intensive rehab with accommodations for her limited hearing.  Follow for PT goals on POC.   Recommendations for follow up therapy are one component of a multi-disciplinary discharge planning process, led by the attending physician.  Recommendations may be updated based on patient status, additional functional criteria and insurance authorization.  Follow Up Recommendations  Skilled nursing-short term rehab (<3 hours/day) Can patient physically be transported by private vehicle: Yes   Assistance Recommended at Discharge Intermittent Supervision/Assistance  Patient can return home with the following A little help with walking and/or transfers;A little help with bathing/dressing/bathroom;Assistance with cooking/housework;Assist for transportation;Help with stairs or ramp for entrance   Equipment Recommendations  None recommended by PT    Recommendations for Other Services       Precautions / Restrictions Precautions Precautions: Fall Precaution Comments: 4LO2 Restrictions Weight Bearing Restrictions: No     Mobility  Bed  Mobility Overal bed mobility: Needs Assistance Bed Mobility: Supine to Sit, Sit to Supine     Supine to sit: Mod assist Sit to supine: Min assist   General bed mobility comments: assist to scoot to EOB and to bring legs to bed    Transfers Overall transfer level: Needs assistance Equipment used: Rolling walker (2 wheels) Transfers: Sit to/from Stand Sit to Stand: Min assist   Step pivot transfers: Min assist       General transfer comment: assisted to [facility]    Ambulation/Gait Ambulation/Gait assistance: Min guard Gait Distance (Feet): 12 Feet (in short walks) Assistive device: Rolling walker (2 wheels) Gait Pattern/deviations: Step-to pattern, Decreased stride length, Wide base of support Gait velocity: reduced Gait velocity interpretation: <1.31 ft/sec, indicative of household ambulator Pre-gait activities: Balance with standing on walker General Gait Details: cued her for maneuvering walker and then pt had the idea   Stairs             Wheelchair Mobility    Modified Rankin (Stroke Patients Only)       Balance Overall balance assessment: Needs assistance Sitting-balance support: Feet supported Sitting balance-Leahy Scale: Fair     Standing balance support: Bilateral upper extremity supported, During functional activity Standing balance-Leahy Scale: Poor                              Cognition Arousal/Alertness: Awake/alert Behavior During Therapy: WFL for tasks assessed/performed Overall Cognitive Status: Within Functional Limits for tasks assessed                                 General  Comments: Patient very HOH        Exercises      General Comments General comments (skin integrity, edema, etc.): Pt is up to side of bed then standing and stepping plus transition to [facility] for very small bowel movement      Pertinent Vitals/Pain Pain Assessment Pain Assessment: No/denies pain    Home Living                           Prior Function            PT Goals (current goals can now be found in the care plan section) Acute Rehab PT Goals Patient Stated Goal: home Progress towards PT goals: Progressing toward goals    Frequency    Min 3X/week      PT Plan Current plan remains appropriate    Co-evaluation              AM-PAC PT "6 Clicks" Mobility   Outcome Measure  Help needed turning from your back to your side while in a flat bed without using bedrails?: A Little Help needed moving from lying on your back to sitting on the side of a flat bed without using bedrails?: A Little Help needed moving to and from a bed to a chair (including a wheelchair)?: A Little Help needed standing up from a chair using your arms (e.g., wheelchair or bedside chair)?: A Little Help needed to walk in hospital room?: A Little Help needed climbing 3-5 steps with a railing? : Total 6 Click Score: 16    End of Session Equipment Utilized During Treatment: Oxygen Activity Tolerance: Patient tolerated treatment well Patient left: with call bell/phone within reach;in bed;with bed alarm set Nurse Communication: Mobility status PT Visit Diagnosis: Other abnormalities of gait and mobility (R26.89);Muscle weakness (generalized) (M62.81)     Time: 3790-2409 PT Time Calculation (min) (ACUTE ONLY): 19 min  Charges:  $Therapeutic Activity: 8-22 mins Ivar Drape 09/29/2022, 12:16 PM  Samul Dada, PT PhD Acute Rehab Dept. Number: [facility] [id number] and [facility] [phone]

## 2022-09-29 NOTE — Assessment & Plan Note (Signed)
Patient's AST/ALT have been increasing since transfer from ICU. At first assumed to be shock liver; however, bilirubin continues to be wnl. Could be due to daptomycin or doxycycline. Both have been shown to increase LFTs. Could also have a MRSA hepatic fluid collection as patient's WBC's have still been elevated.  - RUQ ultrasound  - hepatitis panel  - CK level

## 2022-09-29 NOTE — Progress Notes (Signed)
Speech Language Pathology Treatment: Dysphagia  Patient Details Name: Madeline Jimenez MRN: [id number] DOB: [birth year] Today's Date: 09/29/2022 Time: 4268-3419 SLP Time Calculation (min) (ACUTE ONLY): 12 min  Assessment / Plan / Recommendation Clinical Impression  Madeline Jimenez was seen for dysphagia tx. She is doing well tolerating thin liquids with no s/s of aspiration, even when consuming sequential boluses. Dentures/partials not in place, and she had some difficulty with mastication of more solid boluses - recommend continuing dysphagia 2 with further eval for diet advancement at SNF and when dentures are in place.  Continue thin liquids. No further acute care SLP f/u.   HPI HPI: 87 year old female admitted 12/6 with SOB and right shoulder pain. Dx sepsis secondary to COVID pna (requiring recent hospitalization, D/C 11/30) +/- cellulitis; 12/7 I&D of left forearm and right shoulder abscesses. ETT 12/7-12/10. PMHx COPD on 2L at home, HTN, HLD, PVD.      SLP Plan  Continue with current plan of care      Recommendations for follow up therapy are one component of a multi-disciplinary discharge planning process, led by the attending physician.  Recommendations may be updated based on patient status, additional functional criteria and insurance authorization.    Recommendations  Diet recommendations: Dysphagia 2 (fine chop);Thin liquid Liquids provided via: Straw;Cup Medication Administration: Whole meds with puree Supervision: Patient able to self feed;Intermittent supervision to cue for compensatory strategies Compensations: Slow rate;Small sips/bites Postural Changes and/or Swallow Maneuvers: Seated upright 90 degrees                Oral Care Recommendations: Oral care BID SLP Visit Diagnosis: Dysphagia, unspecified (R13.10) Plan: Continue with current plan of care         Madeline Jimenez L. Samson Frederic, MA CCC/SLP Clinical Specialist - Acute Care SLP Acute Rehabilitation  Services Office number [phone]   [name] Madeline Jimenez  09/29/2022, 11:09 AM

## 2022-09-29 NOTE — Progress Notes (Signed)
Brief Interim Progress Note   Updated patient's niece regarding elevated AST/ALT. Informed that we will be doing a RUQ ultrasound, hepatitis panel. Also mentioned that she will have a PICC placed today. Patient's niece did not have any questions.   Lockie Mola, MD  PGY-1 [facility] Family Medicine

## 2022-09-29 NOTE — Discharge Instructions (Addendum)
Orthopedic Surgery Discharge Instructions  Patient name: Madeline Jimenez Procedure Performed: Right shoulder abscess incision and drainage, left forearm abscess incision and drainage Date of Surgery: 09/21/2022 Surgeon: Willia Craze, MD  Pre-operative Diagnosis: Right shoulder abscess, left forearm abscess  Activity: There are no specific restrictions after this surgery.  You do not need a brace or splint after the surgery.  You can weight-bear as tolerated on bilateral upper extremities.  You are okay to perform range of motion at the shoulder elbow and wrist bilaterally.  Incision Care: You should keep your surgical areas clean and dry at all times for 1 week after discharge.  After 1 week, you can start to shower and let soap and water run over the incisions.  You should not submerge either of the wounds until they have completely healed.  You can leave your left forearm incision open to air.  Your right shoulder should undergo daily packing changes which have been happening in the hospital.  It should mimic what hospital staff is done for these packing changes.  He should place gauze and tape over the area after the packing changes complete.  You may safely resume any home blood thinners (warfarin, lovenox, apixaban, plavix, xarelto, etc) after your surgery. Take these medications as they were previously prescribed.  Reasons to Call the Office After Surgery: You should feel free to call the office with any concerns or questions you have in the post-operative period, but you should definitely notify the office if you develop: -shortness of breath, chest pain, or trouble breathing -excessive bleeding, drainage, redness, or swelling around the surgical site -fevers, chills, or pain that is getting worse with each passing day -persistent nausea or vomiting -other concerns about your surgery  Follow Up Appointments: You should call the office (number below) to schedule an appoint to check on  your right shoulder and left forearm approximately 2 weeks after discharge from the hospital.    Office Information:  -Dr. Willia Craze -Phone number: [phone] -Address: [address]       [place], [place] [address]    ________________________________________________________ Information on my medicine - ELIQUIS (apixaban)  Why was Eliquis prescribed for you? Eliquis was prescribed to treat blood clots that may have been found in the veins of your legs (deep vein thrombosis) or in your lungs (pulmonary embolism) and to reduce the risk of them occurring again.  What do You need to know about Eliquis ? The starting dose is 10 mg (two 5 mg tablets) taken TWICE daily for the FIRST SEVEN (7) DAYS, then on  10/02/2022  the dose is reduced to ONE 5 mg tablet taken TWICE daily.  Eliquis may be taken with or without food.   Try to take the dose about the same time in the morning and in the evening. If you have difficulty swallowing the tablet whole please discuss with your pharmacist how to take the medication safely.  Take Eliquis exactly as prescribed and DO NOT stop taking Eliquis without talking to the doctor who prescribed the medication.  Stopping may increase your risk of developing a new blood clot.  Refill your prescription before you run out.  After discharge, you should have regular check-up appointments with your healthcare provider that is prescribing your Eliquis.    What do you do if you miss a dose? If a dose of ELIQUIS is not taken at the scheduled time, take it as soon as possible on the same day and twice-daily administration should be resumed.  The dose should not be doubled to make up for a missed dose.  Important Safety Information A possible side effect of Eliquis is bleeding. You should call your healthcare provider right away if you experience any of the following: Bleeding from an injury or your nose that does not stop. Unusual colored urine (red or dark  brown) or unusual colored stools (red or black). Unusual bruising for unknown reasons. A serious fall or if you hit your head (even if there is no bleeding).  Some medicines may interact with Eliquis and might increase your risk of bleeding or clotting while on Eliquis. To help avoid this, consult your healthcare provider or pharmacist prior to using any new prescription or non-prescription medications, including herbals, vitamins, non-steroidal anti-inflammatory drugs (NSAIDs) and supplements.  This website has more information on Eliquis (apixaban): http://www.eliquis.com/eliquis/home

## 2022-09-29 NOTE — Progress Notes (Signed)
Daily Progress Note Intern Pager: [phone]  Patient name: Madeline Jimenez Medical record number: [id number] Date of birth: [birth year] Age: 87 y.o. Gender: female  Primary Care Provider: Arnetha Courser, MD Consultants: ID, PCCM s/o 12/11, Orthopedic Surgery s/o 12/10  Code Status: Partial, no CPR or meds ok for short term intubation   Pt Overview and Major Events to Date:  12/6 - Admitted, Started on Vancomycin  12/7 - Shoulder and left forearm I&D, taken to ICU after inability to extubate, Started on heparin gtt for segmental PE   12/10 - Extubated on nasal cannula  12/11 - Transitioned from heparin to eliquis for PE 12/12 - Transferred from ICU to floor, transitioned from vancomycin to daptomycin and doxycyline   Assessment and Plan: Madeline Jimenez is a 87 y.o. female who was admitted for acute on chronic respiratory failure, found to have MRSA bacteremia with cavitary pneumonia, shoulder abscess, and forearm cellulitis s/p I&D, and bilateral segmental PE. Doing well, continuing IV antibiotics, and slowly weaning oxygen.    PMH significant for recent admission for COVID (dc on 11/30), COPD on 2L home O2, HTN and HLD.   * Sepsis (HCC) s/t MRSA bacteremia Etiology septic thrombophlebitis vs septic endocarditis. Bcx NGTD. Slightly elevated leukocytosis today; however, continues to be afebrile.  - Doxycycline po for 2 weeks and Daptomycin IV for 6 weeks  - ID consulted, appreciate recs - Plan for PICC today  - Vitals per floor protocol, continue tele  Elevated LFTs Patient's AST/ALT have been increasing since transfer from ICU. At first assumed to be shock liver; however, bilirubin continues to be wnl. Could be due to daptomycin or doxycycline. Both have been shown to increase LFTs. Could also have a MRSA hepatic fluid collection as patient's WBC's have still been elevated.  - RUQ ultrasound  - hepatitis panel  - CK level   Acute on chronic respiratory failure (HCC) Now down  to home O2 (2L). On 2L home oxygen for COPD. Respiratory failure in the setting of cavitary pna and bilateral segmental pna.  - Continue abx as mentioned above  - Continue eliquis for PE  - Oxygen therapy as needed to maintain sats 88-92%  - Incentive spirometry  - Continue Brovana, atrovent, and pulmicort - As needed albuterol   Right shoulder pain S/p I&D with orthopedic surgery on 12/7.  - Tylenol 650 mg q6hrs scheduled  - Continue daily iodoform dressing changes.  - Contact ortho regarding outpatient f/u and dressing changes  Hypertension Hypertension improved, but still mildly elevated. May need additional agent outpatient. Within inpatient goals.  - Continue amlodipine 5 mg     FEN/GI: Dys 1  PPx: On therapeutic eliquis  Dispo: SNF pending improvement.   Subjective:  Patient denies pain. She denies CP, SOB. She understands she will have PICC today and is comfortable with going to SNF soon.   Objective: Temp:  [98.3 F (36.8 C)-98.7 F (37.1 C)] 98.3 F (36.8 C) (12/15 0938) Pulse Rate:  [64-73] 64 (12/15 0938) Resp:  [16-18] 17 (12/15 0938) BP: (138-157)/(59-92) 157/60 (12/15 0938) SpO2:  [95 %-99 %] 96 % (12/15 3546) Physical Exam: General: Chronically ill appearing  Cardiovascular: RRR, cap refill < 2 seconds Respiratory: No increased work of breathing on 2 L O2  Abdomen: Soft, non tender, no hepatomegaly, non distended  Extremities: R forearm with some erythema surrounding IV site, no tenderness, no edema, left forearm with erythema and eschar but less edema from prior, no drainage.  Neuro: A&O x  4, very hard of hearing   Laboratory: Most recent CBC Lab Results  Component Value Date   WBC 29.0 (H) 09/29/2022   HGB 9.2 (L) 09/29/2022   HCT 28.8 (L) 09/29/2022   MCV 94.1 09/29/2022   PLT 258 09/29/2022   Most recent BMP    Latest Ref Rng & Units 09/29/2022    3:32 AM  BMP  Glucose 70 - 99 mg/dL 762   BUN 8 - 23 mg/dL 24   Creatinine 8.31 - 1.00  mg/dL 5.17   Sodium 616 - 073 mmol/L 142   Potassium 3.5 - 5.1 mmol/L 4.3   Chloride 98 - 111 mmol/L 104   CO2 22 - 32 mmol/L 31   Calcium 8.9 - 10.3 mg/dL 8.5     Lockie Mola, MD 09/29/2022, 12:05 PM  PGY-1, [facility] Family Medicine FPTS Intern pager: [phone], text pages welcome Secure chat group [facility] Teaching Service

## 2022-09-30 ENCOUNTER — Inpatient Hospital Stay (HOSPITAL_COMMUNITY): Payer: Medicare Other

## 2022-09-30 LAB — COMPREHENSIVE METABOLIC PANEL
ALT: 125 U/L — ABNORMAL HIGH (ref 0–44)
AST: 71 U/L — ABNORMAL HIGH (ref 15–41)
Albumin: 2 g/dL — ABNORMAL LOW (ref 3.5–5.0)
Alkaline Phosphatase: 86 U/L (ref 38–126)
Anion gap: 9 (ref 5–15)
BUN: 18 mg/dL (ref 8–23)
CO2: 32 mmol/L (ref 22–32)
Calcium: 8.3 mg/dL — ABNORMAL LOW (ref 8.9–10.3)
Chloride: 100 mmol/L (ref 98–111)
Creatinine, Ser: 0.88 mg/dL (ref 0.44–1.00)
GFR, Estimated: >60 mL/min (ref >60)
Glucose, Bld: 101 mg/dL — ABNORMAL HIGH (ref 70–99)
Potassium: 4.2 mmol/L (ref 3.5–5.1)
Sodium: 141 mmol/L (ref 135–145)
Total Bilirubin: 0.6 mg/dL (ref 0.3–1.2)
Total Protein: 5.3 g/dL — ABNORMAL LOW (ref 6.5–8.1)

## 2022-09-30 LAB — CBC
HCT: 29.6 % — ABNORMAL LOW (ref 36.0–46.0)
Hemoglobin: 9.5 g/dL — ABNORMAL LOW (ref 12.0–15.0)
MCH: 30.1 pg (ref 26.0–34.0)
MCHC: 32.1 g/dL (ref 30.0–36.0)
MCV: 93.7 fL (ref 80.0–100.0)
Platelets: 287 10*3/uL (ref 150–400)
RBC: 3.16 MIL/uL — ABNORMAL LOW (ref 3.87–5.11)
RDW: 14.6 % (ref 11.5–15.5)
WBC: 27.5 10*3/uL — ABNORMAL HIGH (ref 4.0–10.5)
nRBC: 0 % (ref 0.0–0.2)

## 2022-09-30 NOTE — Progress Notes (Signed)
Per orders, dsg on the right shoulder done with 2x2 wet to dry dressing , covered with dry 2x2 and a mepllex applied on top.

## 2022-09-30 NOTE — Progress Notes (Signed)
Orthopedic Surgery Progress Note   Assessment: Patient is a 87 y.o. female with right shoulder abscess s/p I&D, left forearm abscess s/p I&D   Plan: -Operative plans: complete -Okay for diet and dvt ppx from ortho perspective -Antibiotics: per primary -Daily wet-to-dry dressing changes to right shoulder -Weight bearing status: as tolerated -Follow up intra-op cultures (staph aureus) -PT/OT evaluate and treat -Pain control -Dispo: per primary  ___________________________________________________________________________  Subjective: No acute events overnight. Not having any pain in the left forearm. Right shoulder pain is getting better with time. Denies paresthesias and numbness.    Physical Exam:  General: no acute distress, appears stated age, laying in bed Neurologic: awake, following commands, answering questions appropriately Respiratory: unlabored breathing  MSK:   -Right upper extremity  No drainage from the shoulder, packing in the shoulder had small amount of blood, when packing removed, wound base appeared healthy and no purulence seen Fires deltoid, biceps, triceps, wrist extensors, wrist flexors, finger extensors, finger flexors AIN/PIN/IO intact  Sensation intact to light touch in median/ulnar/radial/axillary nerve distributions  Hand warm and well perfused, palpable radial pulse  No pain with internal and external rotation at shoulder  -Left upper extremity  Surgical incision with scab over it, appears to be healing as expected, erythema around wrist but no visible purulence or palpable fluid collection Fires deltoid, biceps, triceps, wrist extensors, wrist flexors, finger extensors, finger flexors AIN/PIN/IO intact  Sensation intact to light touch in median/ulnar/radial/axillary nerve distributions  Hand warm and well perfused, palpable radial pulse  Patient name: Madeline Jimenez Patient MRN: [id number] Date: 09/30/22

## 2022-09-30 NOTE — Progress Notes (Signed)
Abdominal ultrasound being done at bedside. No complaints of pain or discomfort from the pt.

## 2022-09-30 NOTE — Progress Notes (Signed)
Daily Progress Note Intern Pager: [phone]  Patient name: Madeline Jimenez Medical record number: [id number] Date of birth: 1934/11/05 Age: 87 y.o. Gender: female  Primary Care Provider: Arnetha Courser, MD Consultants: ID, PCCM s/o 12/11, Orthopedic Surgery s/o 12/10  Code Status: Partial, no CPR or meds ok for short term intubation    Pt Overview and Major Events to Date:  12/6 - Admitted, Started on Vancomycin  12/7 - Shoulder and left forearm I&D, taken to ICU after inability to extubate, Started on heparin gtt for segmental PE   12/10 - Extubated on nasal cannula  12/11 - Transitioned from heparin to eliquis for PE 12/12 - Transferred from ICU to floor, transitioned from vancomycin to daptomycin and doxycyline   Assessment and Plan: Madeline Jimenez is a 87 y.o. female who was admitted for acute on chronic respiratory failure, found to have MRSA bacteremia with cavitary pneumonia, shoulder abscess, and forearm cellulitis s/p I&D, and bilateral segmental PE. Doing well, continuing IV antibiotics, and slowly weaning oxygen.    PMH significant for recent admission for COVID (dc on 11/30), COPD on 2L home O2, HTN and HLD.   * MRSA bacteremia Elevated leukocytosis today; however, continues to be afebrile. - Doxycycline po for 2 weeks and Daptomycin IV for 6 weeks  - ID consulted, appreciate recs - Plan for PICC today  - Vitals per floor protocol, continue tele  Elevated LFTs Continue to be elevated, but has been improving. RUQ with hepatic cysts. No concern for hepatic disease or MRSA fluid collection at this point. Hepatitis panel negative.  - CMP tomorrow   Right shoulder pain S/p I&D with orthopedic surgery on 12/7.  - Orthopedic surgery consulted, appreciate recs  - Tylenol 650 mg q6hrs prn, oxycodone 2.5 mg prn  - Continue daily iodoform wet to dry dressing changes.     HTN: Continue amlodipine 5mg   COPD: Continue Brovana, atrovent, and pulmicort, albuterol as  needed  Bilateral Segmental PE: Continue eliquis   FEN/GI: Dys 2 PPx: Eliquis for PE Dispo: SNF pending PICC placement   Subjective:  Patient has no complaints today other than her shoulder pain that she has been having. She requests medication for it. Denies abdominal pain or nausea. Denies CP or SOB.   Objective: Temp:  [98.2 F (36.8 C)-98.8 F (37.1 C)] 98.3 F (36.8 C) (12/16 0740) Pulse Rate:  [59-110] 110 (12/16 0740) Resp:  [16-20] 16 (12/16 0740) BP: (139-156)/(58-93) 139/93 (12/16 0740) SpO2:  [94 %-96 %] 95 % (12/16 0740) Physical Exam: General: Chronically ill and frail appearing, comfortable  Cardiovascular: RRR, well perfused  Respiratory: Improved, No wheezing, no increased work of breathing on 2L, CTAB faint inspiratory crackles on LL base  Abdomen: soft, non tender to palpation, non distended  Extremities: No BLE edema   Laboratory: Most recent CBC Lab Results  Component Value Date   WBC 27.5 (H) 09/30/2022   HGB 9.5 (L) 09/30/2022   HCT 29.6 (L) 09/30/2022   MCV 93.7 09/30/2022   PLT 287 09/30/2022   Most recent BMP    Latest Ref Rng & Units 09/30/2022    2:08 AM  BMP  Glucose 70 - 99 mg/dL 10/02/2022   BUN 8 - 23 mg/dL 18   Creatinine 578 - 1.00 mg/dL 4.69   Sodium 6.29 - 528 mmol/L 141   Potassium 3.5 - 5.1 mmol/L 4.2   Chloride 98 - 111 mmol/L 100   CO2 22 - 32 mmol/L 32   Calcium 8.9 -  10.3 mg/dL 8.3     RUQ ultrasound There are 2 hepatic cysts. No other sonographic abnormalities are seen in right upper quadrant.  Lockie Mola, MD 09/30/2022, 11:17 AM  PGY-1, [facility] Family Medicine FPTS Intern pager: [phone], text pages welcome Secure chat group [facility] Teaching Service

## 2022-10-01 LAB — COMPREHENSIVE METABOLIC PANEL
ALT: 92 U/L — ABNORMAL HIGH (ref 0–44)
AST: 46 U/L — ABNORMAL HIGH (ref 15–41)
Albumin: 2 g/dL — ABNORMAL LOW (ref 3.5–5.0)
Alkaline Phosphatase: 82 U/L (ref 38–126)
Anion gap: 8 (ref 5–15)
BUN: 18 mg/dL (ref 8–23)
CO2: 30 mmol/L (ref 22–32)
Calcium: 7.9 mg/dL — ABNORMAL LOW (ref 8.9–10.3)
Chloride: 100 mmol/L (ref 98–111)
Creatinine, Ser: 0.94 mg/dL (ref 0.44–1.00)
GFR, Estimated: 59 mL/min — ABNORMAL LOW (ref >60)
Glucose, Bld: 89 mg/dL (ref 70–99)
Potassium: 3.6 mmol/L (ref 3.5–5.1)
Sodium: 138 mmol/L (ref 135–145)
Total Bilirubin: 0.8 mg/dL (ref 0.3–1.2)
Total Protein: 5.1 g/dL — ABNORMAL LOW (ref 6.5–8.1)

## 2022-10-01 LAB — CBC WITH DIFFERENTIAL/PLATELET
Abs Immature Granulocytes: 0.27 10*3/uL — ABNORMAL HIGH (ref 0.00–0.07)
Basophils Absolute: 0.1 10*3/uL (ref 0.0–0.1)
Basophils Relative: 0 %
Eosinophils Absolute: 0 10*3/uL (ref 0.0–0.5)
Eosinophils Relative: 0 %
HCT: 27.5 % — ABNORMAL LOW (ref 36.0–46.0)
Hemoglobin: 8.9 g/dL — ABNORMAL LOW (ref 12.0–15.0)
Immature Granulocytes: 1 %
Lymphocytes Relative: 57 %
Lymphs Abs: 14 10*3/uL — ABNORMAL HIGH (ref 0.7–4.0)
MCH: 30.4 pg (ref 26.0–34.0)
MCHC: 32.4 g/dL (ref 30.0–36.0)
MCV: 93.9 fL (ref 80.0–100.0)
Monocytes Absolute: 1.5 10*3/uL — ABNORMAL HIGH (ref 0.1–1.0)
Monocytes Relative: 6 %
Neutro Abs: 8.9 10*3/uL — ABNORMAL HIGH (ref 1.7–7.7)
Neutrophils Relative %: 36 %
Platelets: 285 10*3/uL (ref 150–400)
RBC: 2.93 MIL/uL — ABNORMAL LOW (ref 3.87–5.11)
RDW: 14.7 % (ref 11.5–15.5)
WBC: 24.8 10*3/uL — ABNORMAL HIGH (ref 4.0–10.5)
nRBC: 0 % (ref 0.0–0.2)

## 2022-10-01 LAB — CULTURE, BLOOD (ROUTINE X 2)
Culture: NO GROWTH
Culture: NO GROWTH
Special Requests: ADEQUATE
Special Requests: ADEQUATE

## 2022-10-01 NOTE — Progress Notes (Signed)
     Daily Progress Note Intern Pager: [phone]  Patient name: Madeline Jimenez Medical record number: [id number] Date of birth: 05-28-1935 Age: 87 y.o. Gender: female  Primary Care Provider: Arnetha Courser, MD Consultants: ID, PCCM s/o 12/11, Orthopedic Surgery Code Status: Partial (No CPR or meds, Yes for short term intubation)  Pt Overview and Major Events to Date:  12/6 - Admitted, Started on Vancomycin  12/7 - Shoulder and left forearm I&D, taken to ICU after inability to extubate. Started on heparin gtt for segmental PE   12/10 - Extubated on nasal cannula  12/11 - Transitioned from heparin to eliquis for PE 12/12 - Transferred from ICU to floor, transitioned from vancomycin to daptomycin and doxycyline  Assessment and Plan: Madeline Jimenez is a 87 y.o. female who was admitted for acute on chronic respiratory failure, found to have MRSA bacteremia with cavitary pneumonia, shoulder abscess, and forearm cellulitis s/p I&D, and bilateral segmental PE. Doing well, continuing IV antibiotics, and slowly weaning oxygen.  PMH significant for COPD on 2L, HTN, HLD.  * MRSA bacteremia Leukocytosis starting to down-trend. - Doxycycline x2 weeks (End Date 12/26) - Daptomycin x6 weeks (End Date 1/22) - ID following, appreciate recs - PICC Placement  Elevated LFTs LFTs continue to down-trend. - Trend CMP   Right shoulder pain S/p I&D 12/7 - Orthopedic surgery following, appreciate recs - Continue antibiotics per ID - Wet-to-dry dressing changes to R shoulder - Pain Control:   Tylenol 650mg  q6h PRN  Oxycodone 2.5mg  q6h PRN    FEN/GI: Dysphagia 2 PPx: Eliquis Dispo:SNF ([facility] on Monday), pending PICC placement  Subjective:  Patient reports that she is feeling pretty well this morning, she has no acute concerns and denies any pain.   Objective: Temp:  [97.7 F (36.5 C)-98.3 F (36.8 C)] 97.9 F (36.6 C) (12/17 0438) Pulse Rate:  [69-116] 93 (12/17 0438) Resp:   [16-18] 18 (12/17 0438) BP: (132-139)/(69-93) 132/90 (12/17 0438) SpO2:  [91 %-95 %] 95 % (12/17 0438) Physical Exam: General: elderly female, appears chronically ill  CV: RRR, 3/5 murmur, well-perfused Respiratory: CTAB, normal WOB on 2L (at baseline) Abdomen: soft, non-tender, non-distended Extremities: no peripheral edema  Laboratory: Most recent CBC Lab Results  Component Value Date   WBC 24.8 (H) 10/01/2022   HGB 8.9 (L) 10/01/2022   HCT 27.5 (L) 10/01/2022   MCV 93.9 10/01/2022   PLT 285 10/01/2022   Most recent BMP    Latest Ref Rng & Units 10/01/2022    2:04 AM  BMP  Glucose 70 - 99 mg/dL 89   BUN 8 - 23 mg/dL 18   Creatinine 10/03/2022 - 1.00 mg/dL 1.47   Sodium 8.29 - 562 mmol/L 138   Potassium 3.5 - 5.1 mmol/L 3.6   Chloride 98 - 111 mmol/L 100   CO2 22 - 32 mmol/L 30   Calcium 8.9 - 10.3 mg/dL 7.9     Other pertinent labs    Imaging/Diagnostic Tests: 130 Abdomen Limited RUQ (LIVER/GB) Result Date: 09/30/2022 IMPRESSION: There are 2 hepatic cysts. No other sonographic abnormalities are seen in right upper quadrant. Electronically Signed   By: 10/02/2022 M.D.   On: 09/30/2022 09:14     10/02/2022, DO 10/01/2022, 6:24 AM  PGY-3, [facility] FPTS Intern pager: [phone], text pages welcome Secure chat group [facility] Teaching Service

## 2022-10-01 NOTE — Progress Notes (Addendum)
Telephone consent obtained from niece for PICC procedure.  Arrived at bedside tfor PICC placement in RUA per Dr Luciana Axe.  Upon arrival, noted PIV in RFA red and inflamed from insertion site to mid upper arm.  LFA remains red with dressing intact.  Secure chat sent to Dr Luciana Axe with findings.  Current plan to remove RFA PIV, place another PIV in left arm and have IR place PICC due to Bilateral upper extremity redness and inflammation. Amy RN aware of plan and removed RFA PIV. Dr Elberta Fortis notified at 1315 of events.

## 2022-10-01 NOTE — Progress Notes (Signed)
Per Dr Luciana Axe, ID, okay to place PICC in RUE.

## 2022-10-01 NOTE — Plan of Care (Signed)
  Problem: Education: Goal: Knowledge of General Education information will improve Description: Including pain rating scale, medication(s)/side effects and non-pharmacologic comfort measures Outcome: Progressing   Problem: Health Behavior/Discharge Planning: Goal: Ability to manage health-related needs will improve Outcome: Progressing   Problem: Clinical Measurements: Goal: Ability to maintain clinical measurements within normal limits will improve Outcome: Progressing Goal: Will remain free from infection Outcome: Progressing Goal: Diagnostic test results will improve Outcome: Progressing Goal: Respiratory complications will improve Outcome: Progressing Goal: Cardiovascular complication will be avoided Outcome: Progressing   Problem: Activity: Goal: Risk for activity intolerance will decrease Outcome: Progressing   Problem: Nutrition: Goal: Adequate nutrition will be maintained Outcome: Progressing   Problem: Coping: Goal: Level of anxiety will decrease Outcome: Progressing   Problem: Elimination: Goal: Will not experience complications related to bowel motility Outcome: Progressing Goal: Will not experience complications related to urinary retention Outcome: Progressing   Problem: Pain Managment: Goal: General experience of comfort will improve Outcome: Progressing   Problem: Safety: Goal: Ability to remain free from injury will improve Outcome: Progressing   Problem: Skin Integrity: Goal: Risk for impaired skin integrity will decrease Outcome: Progressing   Problem: Activity: Goal: Ability to tolerate increased activity will improve Outcome: Progressing   Problem: Respiratory: Goal: Ability to maintain a clear airway and adequate ventilation will improve Outcome: Progressing   Problem: Role Relationship: Goal: Method of communication will improve Outcome: Progressing   Problem: Education: Goal: Ability to describe self-care measures that may  prevent or decrease complications (Diabetes Survival Skills Education) will improve Outcome: Progressing Goal: Individualized Educational Video(s) Outcome: Progressing   Problem: Coping: Goal: Ability to adjust to condition or change in health will improve Outcome: Progressing   Problem: Fluid Volume: Goal: Ability to maintain a balanced intake and output will improve Outcome: Progressing   Problem: Health Behavior/Discharge Planning: Goal: Ability to identify and utilize available resources and services will improve Outcome: Progressing Goal: Ability to manage health-related needs will improve Outcome: Progressing   Problem: Metabolic: Goal: Ability to maintain appropriate glucose levels will improve Outcome: Progressing   Problem: Nutritional: Goal: Maintenance of adequate nutrition will improve Outcome: Progressing Goal: Progress toward achieving an optimal weight will improve Outcome: Progressing   Problem: Skin Integrity: Goal: Risk for impaired skin integrity will decrease Outcome: Progressing   Problem: Tissue Perfusion: Goal: Adequacy of tissue perfusion will improve Outcome: Progressing   Problem: Safety: Goal: Non-violent Restraint(s) Outcome: Progressing   

## 2022-10-02 ENCOUNTER — Other Ambulatory Visit (HOSPITAL_COMMUNITY): Payer: Self-pay

## 2022-10-02 ENCOUNTER — Inpatient Hospital Stay (HOSPITAL_COMMUNITY): Payer: Medicare Other

## 2022-10-02 DIAGNOSIS — R000 Tachycardia, unspecified: Secondary | ICD-10-CM | POA: Insufficient documentation

## 2022-10-02 HISTORY — PX: IR FLUORO GUIDE CV LINE RIGHT: IMG2283

## 2022-10-02 HISTORY — PX: IR US GUIDE VASC ACCESS RIGHT: IMG2390

## 2022-10-02 LAB — CBC
HCT: 29.1 % — ABNORMAL LOW (ref 36.0–46.0)
Hemoglobin: 9.5 g/dL — ABNORMAL LOW (ref 12.0–15.0)
MCH: 30.4 pg (ref 26.0–34.0)
MCHC: 32.6 g/dL (ref 30.0–36.0)
MCV: 93.3 fL (ref 80.0–100.0)
Platelets: 350 10*3/uL (ref 150–400)
RBC: 3.12 MIL/uL — ABNORMAL LOW (ref 3.87–5.11)
RDW: 14.8 % (ref 11.5–15.5)
WBC: 24.2 10*3/uL — ABNORMAL HIGH (ref 4.0–10.5)
nRBC: 0 % (ref 0.0–0.2)

## 2022-10-02 LAB — CBC WITH DIFFERENTIAL/PLATELET
Abs Immature Granulocytes: 0 10*3/uL (ref 0.00–0.07)
Basophils Absolute: 0 10*3/uL (ref 0.0–0.1)
Basophils Relative: 0 %
Eosinophils Absolute: 0.3 10*3/uL (ref 0.0–0.5)
Eosinophils Relative: 1 %
HCT: 29.2 % — ABNORMAL LOW (ref 36.0–46.0)
Hemoglobin: 9.1 g/dL — ABNORMAL LOW (ref 12.0–15.0)
Lymphocytes Relative: 15 %
Lymphs Abs: 4.2 10*3/uL — ABNORMAL HIGH (ref 0.7–4.0)
MCH: 29.5 pg (ref 26.0–34.0)
MCHC: 31.2 g/dL (ref 30.0–36.0)
MCV: 94.8 fL (ref 80.0–100.0)
Monocytes Absolute: 0.8 10*3/uL (ref 0.1–1.0)
Monocytes Relative: 3 %
Neutro Abs: 22.5 10*3/uL — ABNORMAL HIGH (ref 1.7–7.7)
Neutrophils Relative %: 81 %
Platelets: 333 10*3/uL (ref 150–400)
RBC: 3.08 MIL/uL — ABNORMAL LOW (ref 3.87–5.11)
RDW: 14.8 % (ref 11.5–15.5)
WBC: 27.8 10*3/uL — ABNORMAL HIGH (ref 4.0–10.5)
nRBC: 0 % (ref 0.0–0.2)
nRBC: 0 /100 WBC

## 2022-10-02 LAB — COMPREHENSIVE METABOLIC PANEL
ALT: 80 U/L — ABNORMAL HIGH (ref 0–44)
AST: 36 U/L (ref 15–41)
Albumin: 2 g/dL — ABNORMAL LOW (ref 3.5–5.0)
Alkaline Phosphatase: 87 U/L (ref 38–126)
Anion gap: 10 (ref 5–15)
BUN: 29 mg/dL — ABNORMAL HIGH (ref 8–23)
CO2: 31 mmol/L (ref 22–32)
Calcium: 8.9 mg/dL (ref 8.9–10.3)
Chloride: 98 mmol/L (ref 98–111)
Creatinine, Ser: 1.1 mg/dL — ABNORMAL HIGH (ref 0.44–1.00)
GFR, Estimated: 49 mL/min — ABNORMAL LOW (ref >60)
Glucose, Bld: 110 mg/dL — ABNORMAL HIGH (ref 70–99)
Potassium: 3.6 mmol/L (ref 3.5–5.1)
Sodium: 139 mmol/L (ref 135–145)
Total Bilirubin: 0.8 mg/dL (ref 0.3–1.2)
Total Protein: 5.4 g/dL — ABNORMAL LOW (ref 6.5–8.1)

## 2022-10-02 LAB — TSH: TSH: 1.486 u[IU]/mL (ref 0.350–4.500)

## 2022-10-02 LAB — MAGNESIUM: Magnesium: 1.8 mg/dL (ref 1.7–2.4)

## 2022-10-02 MED ORDER — CARVEDILOL 3.125 MG PO TABS
3.1250 mg | ORAL_TABLET | Freq: Two times a day (BID) | ORAL | Status: DC
  Administered 2022-10-02 – 2022-10-03 (×2): 3.125 mg via ORAL
  Filled 2022-10-02 (×2): qty 1

## 2022-10-02 MED ORDER — LIDOCAINE HCL 1 % IJ SOLN
INTRAMUSCULAR | Status: AC
  Administered 2022-10-02: 10 mL
  Filled 2022-10-02: qty 20

## 2022-10-02 MED ORDER — MAGNESIUM SULFATE 2 GM/50ML IV SOLN
2.0000 g | Freq: Once | INTRAVENOUS | Status: AC
  Administered 2022-10-02: 2 g via INTRAVENOUS
  Filled 2022-10-02: qty 50

## 2022-10-02 MED ORDER — POTASSIUM CHLORIDE 20 MEQ PO PACK
40.0000 meq | PACK | Freq: Once | ORAL | Status: AC
  Administered 2022-10-02: 40 meq via ORAL
  Filled 2022-10-02: qty 2

## 2022-10-02 NOTE — TOC Benefit Eligibility Note (Signed)
Patient Product/process development scientist completed.    The patient is currently admitted and upon discharge could be taking Eliquis 5mg .  The current 30 day co-pay is $10.35.   The patient is insured through Part D   [employer], CPHT Pharmacy Patient Advocate Specialist [facility] Patient Advocate Team Direct Number: [phone]  Fax: [phone]

## 2022-10-02 NOTE — Assessment & Plan Note (Deleted)
Cr 1.1 (0.9)

## 2022-10-02 NOTE — Procedures (Signed)
Vascular and Interventional Radiology Procedure Note  Patient: Madeline Jimenez DOB: 1935/03/10 Medical Record Number: [id number] Note Date/Time: 10/02/22 6:04 PM   Performing Physician: Roanna Banning, MD Assistant(s): None  Diagnosis: Poor IV access and Long Term IV Abx  Procedure: TUNNELED CENTRAL VENOUS "PowerLine" CATHETER PLACEMENT  Anesthesia: Local Anesthetic Complications: None Estimated Blood Loss: Minimal Specimens:  None  Findings:  Successful placement of right-sided, 23 cm (tip-to-cuff), tunneled PowerLine catheter with the tip of the catheter in the proximal right atrium.  Plan: Catheter ready for use.  See detailed procedure note with images in PACS. The patient tolerated the procedure well without incident or complication and was returned to Floor Bed in stable condition.    Roanna Banning, MD Vascular and Interventional Radiology Specialists [facility] Radiology   Pager. [phone] Clinic. [phone]

## 2022-10-02 NOTE — Assessment & Plan Note (Addendum)
VSS overall stable. Did have repeat tachycardia around 2:30pm yesterday. Telemetry consistent with atrial tachycardia, no new Vtach. -Continue Co-Reg 3.125mg  BID -Cardiology curbsided, patient does not have atrial fibrillation -Continue cardiac monitoring

## 2022-10-02 NOTE — Progress Notes (Signed)
FMTS Brief Progress Note  S: Upon visiting patient for night rounds with Dr.Lynn, patient was soundly sleeping.   O: BP (!) 122/54 (BP Location: Left Arm)   Pulse (!) 53   Temp 97.6 F (36.4 C) (Oral)   Resp 16   Ht 5\' 4"  (1.626 m)   Wt 58.9 kg   SpO2 95%   BMI 22.29 kg/m   General: Comfortably asleep, frail, chronically ill CV: Regular rate and rhythm, pulses regular palpable and equal radial Respiratory: Normal work of breathing on 2 L oxygen, unlabored audible breathing Derm: Right forearm and elbow erythematous, slightly warm, left forearm erythematous with dressing over top.  A/P: MRSA bacteremia and cardiac arrhythmia (run of V. tach) Patient stable at this time.  Comfortably sleeping in no acute distress.  Telemetry shows abrupt baseline heart rate changes with no V. tach since 5 AM 12/18.  Heart rate was regular upon examining patient. - Continue to monitor telemetry and vitals - Continue Coreg - Orders reviewed. Labs for AM ordered, which was adjusted as needed.  - If condition changes, plan includes assessed patient, possibly increase Coreg, call cardiology.   1/19, MD 10/02/2022, 9:04 PM PGY-1, [facility] Night Resident  Please page [phone] with questions.

## 2022-10-02 NOTE — TOC Progression Note (Signed)
Transition of Care [facility]) - Progression Note    Patient Details  Name: Madeline Jimenez MRN: [id number] Date of Birth: 02-26-1935  [facility] of [facility]) CM/SW Contact  Carley Hammed, [place] Phone Number: 10/02/2022, 11:50 AM  Clinical Narrative:    CSW notified that pt is NMR to DC today. Facility notified and they stated they would try to keep a bed open. CSW notified Niece who requested a medical update from MD. MD notified. TOC will continue to follow for DC needs.      Barriers to Discharge: Continued Medical Work up  Expected Discharge Plan and Services     Discharge Planning Services: CM Consult   Living arrangements for the past 2 months: Single Family Home                                       Social Determinants of Health (SDOH) Interventions    Readmission Risk Interventions    09/13/2022    2:11 PM  Readmission Risk Prevention Plan  Transportation Screening Complete  PCP or Specialist Appt within 5-7 Days Complete  Home Care Screening Complete  Medication Review (RN CM) Complete

## 2022-10-02 NOTE — Progress Notes (Signed)
Called Lonni Fix (patient's niece) to give update about discharge. Discussed pending IR procedure today and need to monitor patient's heart rate throughout tomorrow before discharging. Niece vocalized understanding. Answered other questions.

## 2022-10-02 NOTE — Progress Notes (Signed)
Daily Progress Note Intern Pager: [phone]  Patient name: Madeline Jimenez Medical record number: [id number] Date of birth: Oct 22, 1934 Age: 87 y.o. Gender: female  Primary Care Provider: Arnetha Courser, MD Consultants:  ID, PCCM s/o 12/11, Orthopedic Surgery  Code Status: Partial (No CPR or meds, Yes for short term intubation)   Pt Overview and Major Events to Date:  12/6 - Admitted, Started on Vancomycin  12/7 - Shoulder and left forearm I&D, taken to ICU after inability to extubate. Started on heparin gtt for segmental PE 12/10 - Extubated on nasal cannula  12/11 - Transitioned from heparin to eliquis for PE 12/12 - Transferred from ICU to floor, transitioned from vancomycin to daptomycin and doxycyline  Assessment and Plan:  Madeline Jimenez is a 87 y.o. female who was admitted for acute on chronic respiratory failure, found to have MRSA bacteremia with cavitary pneumonia, shoulder abscess, and forearm cellulitis s/p I&D, and bilateral segmental PE. Doing well, continuing IV antibiotics, and slowly weaning oxygen.  PMH significant for COPD on 2L, HTN, HLD.   * MRSA bacteremia Afebrile. VSS. Leukocytes still elevated. 27.8 (24.8) - Doxycycline x2 weeks (End Date 12/26) - Daptomycin x6 weeks (End Date 1/22) - ID following, appreciate recs - PICC placement per IR today  Elevated LFTs LFTs continue to down-trend. AST/ALT 36/80 - Trend CMP   AKI (acute kidney injury) (HCC)-resolved as of 09/27/2022 Cr 1.1 (0.9)  Right shoulder pain S/p I&D 12/7 - Orthopedic surgery following, appreciate recs - Continue antibiotics per ID - Wet-to-dry dressing changes to R shoulder - Pain Control:   Tylenol 650mg  q6h PRN  Oxycodone 2.5mg  q6h PRN  Tachycardia Tachy to 100-110s yesterday. 20 second run of VT overnight. VSS today and patient asymptomatic. EKG show abnormal P-waves, but no clear tachy rhythm.  -Repeat EKG -Start Co-Reg 3.125mg  BID -Consider Cardiology consult -Continue  cardiac monitoring   FEN/GI: Dysphagia 2 PPx: Eliquis Dispo: SNF  Subjective:  Doing well. Not having difficulty breathing. Not in pain at IV sights or shoulders. Discussed possibly going to rehab facility today.  Objective: Temp:  [97.3 F (36.3 C)-98.2 F (36.8 C)] 97.9 F (36.6 C) (12/18 0930) Pulse Rate:  [63-77] 63 (12/18 0930) Resp:  [17-19] 19 (12/18 0930) BP: (115-164)/(55-76) 133/57 (12/18 0930) SpO2:  [90 %-96 %] 90 % (12/18 0930) Physical Exam: General: NAD, sleeping comfortably in hospital bed Cardiovascular: RRR, no murmurs, no peripheral edema Respiratory: normal WOB on 2L [place], CTAB, no wheezes, ronchi or rales Abdomen: soft, NTTP, no rebound or guarding Extremities: Moving all 4 extremities equally, erythema bilaterally at IV insertion sites in upper extremities   Laboratory: Most recent CBC Lab Results  Component Value Date   WBC 27.8 (H) 10/02/2022   HGB 9.1 (L) 10/02/2022   HCT 29.2 (L) 10/02/2022   MCV 94.8 10/02/2022   PLT 333 10/02/2022   Most recent BMP    Latest Ref Rng & Units 10/02/2022    3:19 AM  BMP  Glucose 70 - 99 mg/dL 10/04/2022   BUN 8 - 23 mg/dL 29   Creatinine 106 - 1.00 mg/dL 2.69   Sodium 4.85 - 462 mmol/L 139   Potassium 3.5 - 5.1 mmol/L 3.6   Chloride 98 - 111 mmol/L 98   CO2 22 - 32 mmol/L 31   Calcium 8.9 - 10.3 mg/dL 8.9     Other pertinent labs: AST - 36, ALT - 80   Imaging/Diagnostic Tests: No new imaging.  703, MD 10/02/2022, 12:20 PM  PGY-1, [place] Intern pager: [phone], text pages welcome Secure chat group [place]

## 2022-10-02 NOTE — Assessment & Plan Note (Addendum)
Cr 1.14 (1.1)

## 2022-10-03 LAB — CBC
HCT: 26 % — ABNORMAL LOW (ref 36.0–46.0)
Hemoglobin: 8.3 g/dL — ABNORMAL LOW (ref 12.0–15.0)
MCH: 30.4 pg (ref 26.0–34.0)
MCHC: 31.9 g/dL (ref 30.0–36.0)
MCV: 95.2 fL (ref 80.0–100.0)
Platelets: 304 10*3/uL (ref 150–400)
RBC: 2.73 MIL/uL — ABNORMAL LOW (ref 3.87–5.11)
RDW: 14.8 % (ref 11.5–15.5)
WBC: 21.7 10*3/uL — ABNORMAL HIGH (ref 4.0–10.5)
nRBC: 0 % (ref 0.0–0.2)

## 2022-10-03 LAB — COMPREHENSIVE METABOLIC PANEL
ALT: 59 U/L — ABNORMAL HIGH (ref 0–44)
AST: 26 U/L (ref 15–41)
Albumin: 1.8 g/dL — ABNORMAL LOW (ref 3.5–5.0)
Alkaline Phosphatase: 77 U/L (ref 38–126)
Anion gap: 6 (ref 5–15)
BUN: 26 mg/dL — ABNORMAL HIGH (ref 8–23)
CO2: 32 mmol/L (ref 22–32)
Calcium: 8.8 mg/dL — ABNORMAL LOW (ref 8.9–10.3)
Chloride: 103 mmol/L (ref 98–111)
Creatinine, Ser: 1.14 mg/dL — ABNORMAL HIGH (ref 0.44–1.00)
GFR, Estimated: 47 mL/min — ABNORMAL LOW (ref >60)
Glucose, Bld: 115 mg/dL — ABNORMAL HIGH (ref 70–99)
Potassium: 4.1 mmol/L (ref 3.5–5.1)
Sodium: 141 mmol/L (ref 135–145)
Total Bilirubin: 0.7 mg/dL (ref 0.3–1.2)
Total Protein: 4.9 g/dL — ABNORMAL LOW (ref 6.5–8.1)

## 2022-10-03 MED ORDER — OXYCODONE HCL 5 MG PO TABS: 2.5000 mg | ORAL_TABLET | Freq: Four times a day (QID) | ORAL | 0 refills | Status: DC | PRN

## 2022-10-03 MED ORDER — SODIUM CHLORIDE 0.9% FLUSH: 10.0000 mL | INTRAVENOUS | Status: DC | PRN

## 2022-10-03 MED ORDER — CARVEDILOL 3.125 MG PO TABS: 3.1250 mg | ORAL_TABLET | Freq: Two times a day (BID) | ORAL | Status: DC

## 2022-10-03 MED ORDER — ACETAMINOPHEN 325 MG PO TABS: 650.0000 mg | ORAL_TABLET | Freq: Four times a day (QID) | ORAL | Status: AC | PRN

## 2022-10-03 MED ORDER — SODIUM CHLORIDE 0.9% FLUSH
10.0000 mL | Freq: Two times a day (BID) | INTRAVENOUS | Status: DC
  Administered 2022-10-03: 10 mL

## 2022-10-03 MED ORDER — APIXABAN 5 MG PO TABS: 5.0000 mg | ORAL_TABLET | Freq: Two times a day (BID) | ORAL | Status: DC

## 2022-10-03 MED ORDER — ALBUTEROL SULFATE (2.5 MG/3ML) 0.083% IN NEBU: 2.5000 mg | INHALATION_SOLUTION | RESPIRATORY_TRACT | 12 refills | Status: AC | PRN

## 2022-10-03 MED ORDER — DAPTOMYCIN IV (FOR PTA / DISCHARGE USE ONLY): 450.0000 mg | INTRAVENOUS | 0 refills | Status: AC

## 2022-10-03 MED ORDER — DOXYCYCLINE HYCLATE 100 MG PO CAPS: 100.0000 mg | ORAL_CAPSULE | Freq: Two times a day (BID) | ORAL | 0 refills | Status: AC

## 2022-10-03 NOTE — Care Management Important Message (Signed)
Important Message  Patient Details  Name: Madeline Jimenez MRN: [id number] Date of Birth: 02-10-1935   Medicare Important Message Given:  Yes     Renie Ora 10/03/2022, 8:16 AM

## 2022-10-03 NOTE — TOC Transition Note (Signed)
Transition of Care [facility]) - CM/SW Discharge Note   Patient Details  Name: Glora Hulgan MRN: [id number] Date of Birth: 03/08/1935  Transition of Care [facility]) CM/SW Contact:  Carley Hammed, LCSWA Phone Number: 10/03/2022, 1:59 PM   Clinical Narrative:     Pt to be transported to [facility] via [place]. Nurse to call report to [phone]  Final next level of care: Skilled Nursing Facility Barriers to Discharge: Barriers Resolved   Patient Goals and CMS Choice        Discharge Placement              Patient chooses bed at: [facility] Patient to be transferred to facility by: [facility] Name of family member notified: Darl Pikes Patient and family notified of of transfer: 10/03/22  Discharge Plan and Services   Discharge Planning Services: CM Consult                                 Social Determinants of Health (SDOH) Interventions     Readmission Risk Interventions    09/13/2022    2:11 PM  Readmission Risk Prevention Plan  Transportation Screening Complete  PCP or Specialist Appt within 5-7 Days Complete  Home Care Screening Complete  Medication Review (RN CM) Complete

## 2022-10-03 NOTE — Progress Notes (Signed)
Pt transported via [facility] gurney with 2L [place] and R chest tunneled PICC in place. All belongings sent with pt. She remains awake alert and oriented at baseline.  Left voicemail with pt's niece Darl Pikes to update her on pt transport to [facility]. Call back number given.

## 2022-10-03 NOTE — Progress Notes (Signed)
Report called to [place] at [facility] [phone]. Pt on transport list with [facility].   Called niece Darl Pikes to notify her of DC to SNF later today.

## 2022-10-03 NOTE — Progress Notes (Signed)
Consulted cardiology regarding patient's intermittent tachycardia over the past 48 hours.  Per Cardiology Master. Cardiology attending reviewed telemetry strips and patient's EKG. Patient does not have A-fib and has intermittent atrial tachycardia.

## 2022-10-03 NOTE — Progress Notes (Signed)
Physical Therapy Treatment Patient Details Name: Madeline Jimenez MRN: [id number] DOB: Apr 29, 1935 Today's Date: 10/03/2022   History of Present Illness 87 yo female presents to [facility] on 12/6 with ShOB and R shoulder pain, ShOB secondary to PNA from recent covid infection (hospitalized 11/28-11/30). Cultures positive for MRSA bacteremia from clavicular abscess with incidental PE as well as lung consolidation with areas of cavitation found on CT.S/p I&D R shoulder abscess, L forearm on 12/7. ETT 12/7-12/10. PMH includes COPD, HTN, HLD, PVD.    PT Comments    Pt is progressing towards goals with the need for only one person assist today. Pt continues to require verbal cues for safety and would need much more physical assistance with out supervision and cueing for correct/safe sequencing. Pt will require 24/7 physical/supervisory assistance for safety on discharge. Due to pt current functional status, balance and endurance pt will benefit from continued skilled physical therapy services in SNF setting on discharge from acute care hospital setting in order to decrease risk for falls,immobility, injury and re-hospitalization. Pt was on 2 L O2 through out session and was able to pace herself accordingly taking rest breaks every 20 ft in standing during ambulation.      Recommendations for follow up therapy are one component of a multi-disciplinary discharge planning process, led by the attending physician.  Recommendations may be updated based on patient status, additional functional criteria and insurance authorization.  Follow Up Recommendations  Skilled nursing-short term rehab (<3 hours/day) Can patient physically be transported by private vehicle: Yes   Assistance Recommended at Discharge Intermittent Supervision/Assistance  Patient can return home with the following A little help with walking and/or transfers;A little help with bathing/dressing/bathroom;Assistance with cooking/housework;Assist for  transportation;Help with stairs or ramp for entrance   Equipment Recommendations  None recommended by PT    Recommendations for Other Services       Precautions / Restrictions Precautions Precautions: Fall Precaution Comments: 2L O2 via [place] Restrictions Weight Bearing Restrictions: No     Mobility  Bed Mobility Overal bed mobility: Needs Assistance Bed Mobility: Supine to Sit, Sit to Supine     Supine to sit: Supervision Sit to supine: Supervision   General bed mobility comments: pt requires extra time to scoot to EOB Patient Response: Cooperative  Transfers Overall transfer level: Needs assistance Equipment used: Rolling walker (2 wheels) Transfers: Sit to/from Stand Sit to Stand: Min guard           General transfer comment: Pt requires increased time and verbal cues for safe sequencing and hand placement in order to improve ease with which pt stands.    Ambulation/Gait Ambulation/Gait assistance: Min guard Gait Distance (Feet): 75 Feet Assistive device: Rolling walker (2 wheels) Gait Pattern/deviations: Step-through pattern, Decreased step length - right, Decreased step length - left, Wide base of support Gait velocity: very slow cadence Gait velocity interpretation: <1.31 ft/sec, indicative of household ambulator   General Gait Details: pt takes standing rest breaks every 20 ft due to impaired endurance.       Balance Overall balance assessment: Needs assistance Sitting-balance support: Feet supported Sitting balance-Leahy Scale: Good     Standing balance support: Bilateral upper extremity supported, During functional activity Standing balance-Leahy Scale: Fair Standing balance comment: reliant on external assist          Cognition Arousal/Alertness: Awake/alert Behavior During Therapy: WFL for tasks assessed/performed Overall Cognitive Status: Within Functional Limits for tasks assessed             General  Comments: Patient HOH                Pertinent Vitals/Pain Pain Assessment Pain Assessment: No/denies pain     PT Goals (current goals can now be found in the care plan section) Acute Rehab PT Goals Patient Stated Goal: home PT Goal Formulation: With patient Time For Goal Achievement: 10/09/22 Potential to Achieve Goals: Good Progress towards PT goals: Progressing toward goals    Frequency    Min 3X/week      PT Plan Current plan remains appropriate       AM-PAC PT "6 Clicks" Mobility   Outcome Measure  Help needed turning from your back to your side while in a flat bed without using bedrails?: A Little Help needed moving from lying on your back to sitting on the side of a flat bed without using bedrails?: A Little Help needed moving to and from a bed to a chair (including a wheelchair)?: A Little Help needed standing up from a chair using your arms (e.g., wheelchair or bedside chair)?: A Little Help needed to walk in hospital room?: A Little Help needed climbing 3-5 steps with a railing? : A Lot 6 Click Score: 17    End of Session Equipment Utilized During Treatment: Oxygen;Gait belt Activity Tolerance: Patient tolerated treatment well Patient left: with call bell/phone within reach;in bed;with bed alarm set Nurse Communication: Mobility status PT Visit Diagnosis: Other abnormalities of gait and mobility (R26.89);Muscle weakness (generalized) (M62.81)     Time: 8182-9937 PT Time Calculation (min) (ACUTE ONLY): 24 min  Charges:  $Gait Training: 8-22 mins $Therapeutic Activity: 8-22 mins                     Harrel Carina, DPT, CLT  Acute Rehabilitation Services Office: [phone] (Secure chat preferred)    Claudia Desanctis 10/03/2022, 2:02 PM

## 2022-10-03 NOTE — Assessment & Plan Note (Signed)
Afebrile. VSS. Leukocytes slowly downtredning. 21.7 (24.2) - Doxycycline x2 weeks (End Date 12/26) - Daptomycin x6 weeks (End Date 1/22) - ID following, appreciate recs - Received Tunneled Catheter for long term antibiotics with IR yesterday

## 2022-10-03 NOTE — Progress Notes (Incomplete)
Daily Progress Note Intern Pager: [phone]  Patient name: Madeline Jimenez Medical record number: [id number] Date of birth: Jan 11, 1935 Age: 87 y.o. Gender: female  Primary Care Provider: Arnetha Courser, MD Consultants: ID, PCCM s/o 12/11, Orthopedic Surgery  Code Status: Partial (No CPR or meds, Yes for short term intubation)   Pt Overview and Major Events to Date:  12/6 - Admitted, Started on Vancomycin  12/7 - Shoulder and left forearm I&D, taken to ICU after inability to extubate. Started on heparin gtt for segmental PE 12/10 - Extubated on nasal cannula  12/11 - Transitioned from heparin to eliquis for PE 12/12 - Transferred from ICU to floor, transitioned from vancomycin to daptomycin and doxycyline  Assessment and Plan:  Madeline Jimenez is a 87 y.o. female who was admitted for acute on chronic respiratory failure, found to have MRSA bacteremia with cavitary pneumonia, shoulder abscess, and forearm cellulitis s/p I&D, and bilateral segmental PE. Doing well, continuing IV antibiotics, and slowly weaning oxygen.  PMH significant for COPD on 2L, HTN, HLD.    * MRSA bacteremia Afebrile. VSS. Leukocytes slowly downtredning. 21.7 (24.2) - Doxycycline x2 weeks (End Date 12/26) - Daptomycin x6 weeks (End Date 1/22) - ID following, appreciate recs - Received Tunneled Catheter for long term antibiotics with IR yesterday  Tachycardia VSS overall stable. Did have repeat tachycardia around 2:30pm yesterday. Telemetry consistent with atrial tachycardia, no new Vtach. -Continue Co-Reg 3.125mg  BID -Cardiology curbsided, patient does not have atrial fibrillation -Continue cardiac monitoring  Elevated LFTs-resolved as of 10/03/2022 LFTs continue to down-trend. AST/ALT 23/59 - Stop trending, resolved   Right shoulder pain S/p I&D 12/7 - Orthopedic surgery following, appreciate recs - Continue antibiotics per ID - Wet-to-dry dressing changes to R shoulder - Pain Control:   Tylenol  650mg  q6h PRN  Oxycodone 2.5mg  q6h PRN  AKI (acute kidney injury) (HCC)-resolved as of 09/27/2022 Cr 1.14 (1.1)    FEN/GI: Dysphagia 2 PPx: Eliquis, PE treatment Dispo: SNF, likely today  Subjective:  IV Tunneled catheter placed yesterday. Doing well states her breathing is doing okay. Does not have chest pain or feel like her heart is beating fast.   Objective: Temp:  [97.6 F (36.4 C)-98.2 F (36.8 C)] 98.2 F (36.8 C) (12/19 0544) Pulse Rate:  [53-108] 54 (12/19 0544) Resp:  [16-19] 18 (12/19 0544) BP: (111-151)/(54-66) 151/63 (12/19 0544) SpO2:  [90 %-98 %] 97 % (12/19 0806) Physical Exam: General: NAD Cardiovascular: RRR, no murmurs, no peripheral edema Respiratory: normal WOB on 2L , CTAB, no wheezes, ronchi or rales Abdomen: soft, NTTP, no rebound or guarding Extremities: Moving all 4 extremities equally   Laboratory: Most recent CBC Lab Results  Component Value Date   WBC 21.7 (H) 10/03/2022   HGB 8.3 (L) 10/03/2022   HCT 26.0 (L) 10/03/2022   MCV 95.2 10/03/2022   PLT 304 10/03/2022   Most recent BMP    Latest Ref Rng & Units 10/03/2022    3:24 AM  BMP  Glucose 70 - 99 mg/dL 10/05/2022   BUN 8 - 23 mg/dL 26   Creatinine 740 - 1.00 mg/dL 8.14   Sodium 4.81 - 856 mmol/L 141   Potassium 3.5 - 5.1 mmol/L 4.1   Chloride 98 - 111 mmol/L 103   CO2 22 - 32 mmol/L 32   Calcium 8.9 - 10.3 mg/dL 8.8     Other pertinent labs: AST - 26 ALT - 59   Imaging/Diagnostic Tests:  IR 314 Guide Vasc Access Right IMPRESSION:  Successful placement of 23 cm single lumen tunneled "Powerline" central venous catheter via the RIGHT internal jugular vein   The tip of the catheter is positioned within the proximal RIGHT atrium. The catheter is ready for immediate use.  Celine Mans, MD 10/03/2022, 9:28 AM  PGY-1, [facility] FPTS Intern pager: [phone], text pages welcome Secure chat group [facility] Teaching Service

## 2022-10-03 NOTE — Discharge Summary (Addendum)
[facility] Discharge Summary  Patient name: Madeline Jimenez Medical record number: [id number] Date of birth: 1934-11-18 Age: 87 y.o. Gender: female Date of Admission: 09/20/2022  Date of Discharge: 10/03/22 Admitting Physician: Shary Key, DO  Primary Care Provider: Lorella Nimrod, MD Consultants:  ID, PCCM s/o 12/11, Orthopedic Surgery   Indication for Hospitalization: MRSA bacteremia  Discharge Diagnoses/Problem List:  Principal Problem for Admission: MRSA bacteremia Other Problems addressed during stay:  Principal Problem:   MRSA bacteremia Active Problems:   Tachycardia   Right shoulder pain   Abscess of forearm, left   Abscess of right shoulder   Protein-calorie malnutrition, severe   Pneumonia of both upper lobes due to infectious organism  Brief Hospital Course:  Madeline Jimenez is a 87 y.o.female with a history of recent COVID admission, COPD, HTN who was admitted to the [facility] Medicine Teaching Service at [facility] for Sepsis and acute hypoxic respiratory failure. Her hospital course is detailed below:  Sepsis, disseminated MRSA bacteremia from several potential sources Right shoulder abscess Left forearm cellulitis  Patient was admitted meeting SIRS criteria given tachycardia, tachypnea, and profound leukocytosis. She had multiple possible sources of infection, including PNA given consolidations on CXR, right upper shoulder septic arthritis/abscess, left arm cellulitis. She was started on broad spectrum antibiotics on 12/7. CT of right shoulder/neck showed right shoulder abscess and pulmonary embolism. Blood cultures were positive for MRSA.  ID was consulted and Cefepime was stopped.  Ortho was consulted for abscess on right shoulder and performed I&D for right shoulder abscess and left forearm cellulitis on 12/7. Echo did not show any endocarditis. TEE was considered but ID elected to treat for duration of endocarditis and forego TEE.  Repeat blood cultures were negative. She was transitioned from vancomycin to daptomycin and doxycyline on 12/12. Patient remained afebrile and stable on the floor. It was suspected patient most likely has septic thrombophlebitis or septic endocarditis with emboli despite negative echo. She had a tunneled catheter placed by IR on 10/02/22 for long term IV antibiotic treatment as recommended by ID. Patient discharged afebrile on Daptomycin and doxycyline, see end dates below.  Acute Hypoxic Respiratory Failure  MRSA Cavitary PNA Bilateral Segmental PE  On admission patient required 5L (home oxygen requirement of 2L).  Multifactorial causes in the setting of COPD exacerbation and cavitary pneumonia. CTA showed bilateral segmental PE as well as cavitary pneumonia. PCCM was consulted.  Patient was started on heparin gtt. Status post I&D patient was unable to be extubated and was admitted to the ICU for ventilatory and pressor support.  Patient was successfully weaned off levophed and extubated. Patient transitioned from heparin to Eliquis on 12/11. Patient's respiratory support was slowly weaned from BiPAP to nasal cannula. Patient weaned to 2L [place] of home baseline and discharged in stable condition on 2L [place].  Acute kidney injury Initial creatinine on admission 1.6, thought to be prerenal in the setting of sepsis. Initial improvement with fluid resuscitation and starting treatment of sepsis.  Per critical care and ID consultation, acute kidney injury thought to be due to acute tubular necrosis 2/2 sepsis as well as vancomycin toxicity, as UA on 12/10 was positive for hemoglobin. Creatinine returned to near baseline by discharge in accordance with antibiotic transition off vancomycin.   Leukocytosis- patient had profound leukocytosis on admission to 66K. Downtrended with antibiotics and no further evidence of infection--discharged WBC was 21, 000. Recommend close monitoring, likely needs hematology follow up.    Anemia- due to chronic  disease, stable throughout admission, hemoglobin 8.3 on day of discharge. No signs of bleeding on Eliquis.  Intermittent atrial tachycardia, noted during infectious course. EKG (single) read as atrial fibrillation. Confirmed with Cardiology this was not atrial fibrillation. Did have intermittent VT on monitor. Electrolyte replenished, she was asymptomatic. Echocardiogram appropriate. Thought to be due to infection. Started on beta-blockade with improvement.   Other chronic conditions were medically managed with home medications and formulary alternatives as necessary.  PCP Follow-up Recommendations: Will have 2 weeks outpatient doxy, 6 weeks outpatient daptomycin. End Date:12/26 for doxycycline,11/06/22 for daptomycin  F/u CT Chest in 2-3 months to check on cavitary lesions CTPA: Given the relatively focal appearance of esophagus, consider dedicated esophageal evaluation on a nonemergent basis. This would be best done outside of the acute setting. Recommend outpatient GI referral  Repeat CBC and BMP outpatient monitor Hemoglobin and Creatinine Given profound leukocytosis, recommend consideration of Hematology consult if not improved after completes course of daptomycin  Wean off of Oxycodone Minimum 3 months on Eliquis for PE. Re-evaluate long term anticoagulation, likely provoked in setting of COVID.  Vertebral compression fractures- recommend DEXA and bisphosphonate as outpatietn   Disposition: SNF  Discharge Condition: stable  Discharge Exam:  Vitals:   10/03/22 0900 10/03/22 0953  BP: (!) 133/54   Pulse: (!) 57 62  Resp: 15   Temp: 98.1 F (36.7 C)   SpO2: 96%    General: NAD Cardiovascular: RRR, no murmurs, no peripheral edema Respiratory: normal WOB on 2L [place], CTAB, no wheezes, ronchi or rales Abdomen: soft, NTTP, no rebound or guarding Extremities: Moving all 4 extremities equally  Significant Procedures:   12/7 Shoulder and Left Forearm  I&D 12/18 Tunneled Catheter placement.  Significant Labs and Imaging:  Recent Labs  Lab 10/02/22 0315 10/02/22 1235 10/03/22 0324  WBC 27.8* 24.2* 21.7*  HGB 9.1* 9.5* 8.3*  HCT 29.2* 29.1* 26.0*  PLT 333 350 304   Recent Labs  Lab 10/02/22 0315 10/02/22 0319 10/03/22 0324  NA  --  139 141  K  --  3.6 4.1  CL  --  98 103  CO2  --  31 32  GLUCOSE  --  110* 115*  BUN  --  29* 26*  CREATININE  --  1.10* 1.14*  CALCIUM  --  8.9 8.8*  MG 1.8  --   --   ALKPHOS  --  87 77  AST  --  36 26  ALT  --  80* 59*  ALBUMIN  --  2.0* 1.8*   CT Angio Chest Pulmonary Embolism (PE) W or WO Contrast 09/21/22 IMPRESSION: 1. Segmental level pulmonary emboli in the posterior RIGHT upper lobe. 2. Straightening of the interventricular septum is of uncertain significance but may be indicative of elevated RIGHT heart pressures. Correlation with echocardiography could be considered as warranted. 3. Bilateral areas of consolidation and septal thickening with areas of cavitation. While findings could reflect multifocal pneumonia with areas of necrosis the appearance does raise the question of multifocal bronchogenic pneumonic neoplasm and in addition there are scattered bilateral pulmonary nodules which may be indicative of metastases. Consider pulmonology consultation for further evaluation. Ultimately short interval follow-up, PET or biopsy may be warranted depending on clinical presentation. 4. Thickening of the esophagus with loss of discrete lumen in the mid esophagus. This areas not well assessed. Correlate with any symptoms of dysphagia. Given the relatively focal appearance of this area consider dedicated esophageal evaluation on a nonemergent basis. This would be best  done outside of the acute setting. 5. Top-normal lymph nodes throughout the chest, again reactive versus is neoplastic. 6. Age indeterminate compression fracture at the thoracolumbar junction not grossly changed since previous  radiograph from September 12, 2022 no more remote comparison is are available. This is favored to be chronic based on appearance but would correlate with any signs of back pain in this area. 7. Aortic atherosclerosis. 8. RIGHT shoulder findings better illustrated on the dedicated shoulder evaluation. 9. Emphysema.  CT SHOULDER RIGHT W CONTRAST 12/07 IMPRESSION: 1. 4.4 x 1.5 cm rim enhancing fluid collection in the subcutaneous tissues overlying the right clavicle suspicious for abscess. Associated surrounding cellulitis and myositis. 2. No CT findings suspicious for septic arthritis or osteomyelitis at the [facility] joint or glenohumeral joint. 3. Extensive pulmonary infiltrates in the right upper lobe and also in the left lower lobe.  Results/Tests Pending at Time of Discharge: none  Discharge Medications:  Allergies as of 10/03/2022   No Known Allergies      Medication List     STOP taking these medications    dexamethasone 6 MG tablet Commonly known as: DECADRON   traZODone 50 MG tablet Commonly known as: DESYREL       TAKE these medications    acetaminophen 325 MG tablet Commonly known as: TYLENOL Take 2 tablets (650 mg total) by mouth every 6 (six) hours as needed for moderate pain or mild pain.   albuterol (2.5 MG/3ML) 0.083% nebulizer solution Commonly known as: PROVENTIL Inhale 3 mLs (2.5 mg total) into the lungs every 4 (four) hours as needed for wheezing or shortness of breath.   amLODipine 5 MG tablet Commonly known as: NORVASC Take 5 mg by mouth daily.   apixaban 5 MG Tabs tablet Commonly known as: ELIQUIS Take 1 tablet (5 mg total) by mouth 2 (two) times daily.   carvedilol 3.125 MG tablet Commonly known as: COREG Take 1 tablet (3.125 mg total) by mouth 2 (two) times daily with a meal.   daptomycin  IVPB Commonly known as: CUBICIN Inject 450 mg into the vein daily. Indication:  MRSA bacteremia, presumed IE First Dose: Yes Last Day of Therapy:   11/06/22 Labs - Once weekly:  CBC/D, BMP, and CPK Labs - Every other week:  ESR and CRP Method of administration: IV Push Pull PICC line at the completion of IV therapy Method of administration may be changed at the discretion of home infusion pharmacist based upon assessment of the patient and/or caregiver's ability to self-administer the medication ordered.   doxycycline 100 MG capsule Commonly known as: Vibramycin Take 1 capsule (100 mg total) by mouth 2 (two) times daily for 12 days.   escitalopram 10 MG tablet Commonly known as: LEXAPRO Take 10 mg by mouth daily.   multivitamin with minerals tablet Take 1 tablet by mouth daily with breakfast.   oxyCODONE 5 MG immediate release tablet Commonly known as: Oxy IR/ROXICODONE Take 0.5 tablets (2.5 mg total) by mouth every 6 (six) hours as needed for severe pain.   Trelegy Ellipta 100-62.5-25 MCG/ACT Aepb Generic drug: Fluticasone-Umeclidin-Vilant Inhale 1 puff into the lungs See admin instructions. Inhale 1 puff into the lungs at 8 AM and 5 PM               Discharge Care Instructions  (From admission, onward)           Start     Ordered   10/03/22 0000  Change dressing on IV access line weekly and PRN  (  Home infusion instructions - [facility] )        10/03/22 1116           Discharge Instructions: Please refer to Patient Instructions section of EMR for full details.  Patient was counseled important signs and symptoms that should prompt return to medical care, changes in medications, dietary instructions, activity restrictions, and follow up appointments.   Follow-Up Appointments: ID Dr. Linus Salmons 10/27/22  Salvadore Oxford, MD 10/03/2022, 1:07 PM PGY-1, [place] Upper-Level Resident Addendum   I have independently interviewed and examined the patient. I have discussed the above with Dr. Ruben Im and agree with the documented plan. My edits for  correction/addition/clarification are included above. Please see any attending notes.   Wells Guiles, DO PGY-3, [place] [facility] 10/03/2022 1:09 PM

## 2022-10-23 LAB — FUNGAL ORGANISM REFLEX

## 2022-10-23 LAB — FUNGUS CULTURE WITH STAIN

## 2022-10-23 LAB — FUNGUS CULTURE RESULT

## 2022-10-24 LAB — FUNGUS CULTURE WITH STAIN

## 2022-10-24 LAB — FUNGAL ORGANISM REFLEX

## 2022-10-24 LAB — FUNGUS CULTURE RESULT

## 2022-10-27 ENCOUNTER — Telehealth: Payer: Self-pay

## 2022-10-27 ENCOUNTER — Other Ambulatory Visit: Payer: Self-pay

## 2022-10-27 ENCOUNTER — Encounter: Payer: Self-pay | Admitting: Internal Medicine

## 2022-10-27 ENCOUNTER — Ambulatory Visit (INDEPENDENT_AMBULATORY_CARE_PROVIDER_SITE_OTHER): Payer: Medicare Other | Admitting: Internal Medicine

## 2022-10-27 VITALS — BP 122/72 | HR 67 | Resp 16 | Ht 64.0 in | Wt 129.0 lb

## 2022-10-27 DIAGNOSIS — R7881 Bacteremia: Secondary | ICD-10-CM

## 2022-10-27 DIAGNOSIS — B9562 Methicillin resistant Staphylococcus aureus infection as the cause of diseases classified elsewhere: Secondary | ICD-10-CM | POA: Diagnosis not present

## 2022-10-27 NOTE — Assessment & Plan Note (Signed)
Doing well, no issues and will continue with the plan for treatment completion on 11/06/22 followed by tunneled catheter removal by IR She otherwise can follow up as needed.   I have personally spent 30 minutes involved in face-to-face and non-face-to-face activities for this patient on the day of the visit. Professional time spent includes the following activities: Preparing to see the patient (review of tests), Obtaining and/or reviewing separately obtained history (admission/discharge record), Performing a medically appropriate examination and/or evaluation , Ordering medications/tests/procedures, referring and communicating with other health care professionals, Documenting clinical information in the EMR, Independently interpreting results (not separately reported), Communicating results to the patient/family/caregiver, Counseling and educating the patient/family/caregiver and Care coordination (not separately reported).

## 2022-10-27 NOTE — Telephone Encounter (Signed)
Patient to complete Daptomycin on 11/06/22 per Dr Linus Salmons. I have contacted [facility] as patient in LTC in SNF.  I spoke to LPN Mrs.Terry. Advised end date and appointment to have Tunnel Cath removed:11/07/22 at [facility] 1:30 PM.  Sent orders with patient and transportation. Scanned a copy.   Notified [facility] as Juluis Rainier.

## 2022-10-27 NOTE — Progress Notes (Signed)
   Subjective:    Patient ID: Madeline Jimenez, female    DOB: 1935-07-28, 88 y.o.   MRN: [id number]  HPI Here for hsfu She was hospitalized last month following a previous hospitalization for COVID and found to have MRSA bacteremia associated with possible septic pulmonary emboli, left forearm cellulitis and right shoulder abscess.  She is doing well now.  No complaints of fever or chills.    Review of Systems  Constitutional:  Negative for chills and fever.  Gastrointestinal:  Negative for diarrhea.       Objective:   Physical Exam Eyes:     General: No scleral icterus. Pulmonary:     Effort: Pulmonary effort is normal.  Neurological:     Mental Status: She is alert.           Assessment & Plan:

## 2022-11-06 LAB — ACID FAST CULTURE WITH REFLEXED SENSITIVITIES (MYCOBACTERIA)
Acid Fast Culture: NEGATIVE
Acid Fast Culture: NEGATIVE
Acid Fast Culture: NEGATIVE
Acid Fast Culture: NEGATIVE
Acid Fast Culture: NEGATIVE

## 2022-11-07 ENCOUNTER — Ambulatory Visit (HOSPITAL_COMMUNITY)
Admission: RE | Admit: 2022-11-07 | Discharge: 2022-11-07 | Disposition: A | Discharge status: Home / Self Care | Payer: Medicare Other | Source: Ambulatory Visit | Attending: Internal Medicine | Admitting: Internal Medicine

## 2022-11-07 DIAGNOSIS — R7881 Bacteremia: Secondary | ICD-10-CM | POA: Insufficient documentation

## 2022-11-07 DIAGNOSIS — B9562 Methicillin resistant Staphylococcus aureus infection as the cause of diseases classified elsewhere: Secondary | ICD-10-CM | POA: Insufficient documentation

## 2022-11-07 DIAGNOSIS — Z452 Encounter for adjustment and management of vascular access device: Secondary | ICD-10-CM | POA: Diagnosis not present

## 2022-11-07 HISTORY — PX: IR REMOVAL TUN CV CATH W/O FL: IMG2289

## 2022-11-07 MED ORDER — LIDOCAINE HCL 1 % IJ SOLN
INTRAMUSCULAR | Status: AC
  Filled 2022-11-07: qty 20

## 2022-11-23 ENCOUNTER — Other Ambulatory Visit (HOSPITAL_COMMUNITY): Payer: Self-pay | Admitting: Family Medicine

## 2022-11-23 DIAGNOSIS — R911 Solitary pulmonary nodule: Secondary | ICD-10-CM

## 2022-11-27 ENCOUNTER — Encounter: Payer: Self-pay | Admitting: Family Medicine

## 2022-11-29 ENCOUNTER — Telehealth: Payer: Self-pay

## 2022-11-29 NOTE — Telephone Encounter (Signed)
Introductory phone call made to patient prior to her new patient appointment with Dr. Tasia Catchings on 11/30/22. I spoke to patient's niece. I introduced myself and asked if she had any questions about appointment. She said "Yes I am so glad that you called, we dont really know where the [facility] is". I gave niece that address and told her exactly where the cancer center is. Patient's niece wrote down the address and thanked me for calling.

## 2022-11-30 ENCOUNTER — Inpatient Hospital Stay: Payer: Medicare Other

## 2022-11-30 ENCOUNTER — Inpatient Hospital Stay: Payer: Medicare Other | Attending: Oncology | Admitting: Oncology

## 2022-11-30 ENCOUNTER — Encounter: Payer: Self-pay | Admitting: Oncology

## 2022-11-30 VITALS — BP 132/45 | HR 46 | Temp 96.6°F | Resp 18 | Wt 123.0 lb

## 2022-11-30 DIAGNOSIS — I10 Essential (primary) hypertension: Secondary | ICD-10-CM | POA: Insufficient documentation

## 2022-11-30 DIAGNOSIS — I2699 Other pulmonary embolism without acute cor pulmonale: Secondary | ICD-10-CM | POA: Diagnosis present

## 2022-11-30 DIAGNOSIS — Z87891 Personal history of nicotine dependence: Secondary | ICD-10-CM | POA: Diagnosis not present

## 2022-11-30 DIAGNOSIS — I700 Atherosclerosis of aorta: Secondary | ICD-10-CM | POA: Insufficient documentation

## 2022-11-30 DIAGNOSIS — Z90710 Acquired absence of both cervix and uterus: Secondary | ICD-10-CM

## 2022-11-30 DIAGNOSIS — J439 Emphysema, unspecified: Secondary | ICD-10-CM | POA: Diagnosis not present

## 2022-11-30 DIAGNOSIS — D649 Anemia, unspecified: Secondary | ICD-10-CM | POA: Insufficient documentation

## 2022-11-30 DIAGNOSIS — Z7901 Long term (current) use of anticoagulants: Secondary | ICD-10-CM | POA: Diagnosis not present

## 2022-11-30 LAB — COMPREHENSIVE METABOLIC PANEL
ALT: 13 U/L (ref 0–44)
AST: 17 U/L (ref 15–41)
Albumin: 3.6 g/dL (ref 3.5–5.0)
Alkaline Phosphatase: 85 U/L (ref 38–126)
Anion gap: 10 (ref 5–15)
BUN: 20 mg/dL (ref 8–23)
CO2: 28 mmol/L (ref 22–32)
Calcium: 8.9 mg/dL (ref 8.9–10.3)
Chloride: 99 mmol/L (ref 98–111)
Creatinine, Ser: 0.82 mg/dL (ref 0.44–1.00)
GFR, Estimated: >60 mL/min (ref >60)
Glucose, Bld: 103 mg/dL — ABNORMAL HIGH (ref 70–99)
Potassium: 3.8 mmol/L (ref 3.5–5.1)
Sodium: 137 mmol/L (ref 135–145)
Total Bilirubin: 0.5 mg/dL (ref 0.3–1.2)
Total Protein: 6.5 g/dL (ref 6.5–8.1)

## 2022-11-30 LAB — CBC WITH DIFFERENTIAL/PLATELET
Abs Immature Granulocytes: 0 10*3/uL (ref 0.00–0.07)
Basophils Absolute: 0 10*3/uL (ref 0.0–0.1)
Basophils Relative: 0 %
Eosinophils Absolute: 0.1 10*3/uL (ref 0.0–0.5)
Eosinophils Relative: 1 %
HCT: 32.2 % — ABNORMAL LOW (ref 36.0–46.0)
Hemoglobin: 10.1 g/dL — ABNORMAL LOW (ref 12.0–15.0)
Immature Granulocytes: 0 %
Lymphocytes Relative: 48 %
Lymphs Abs: 3 10*3/uL (ref 0.7–4.0)
MCH: 30.1 pg (ref 26.0–34.0)
MCHC: 31.4 g/dL (ref 30.0–36.0)
MCV: 96.1 fL (ref 80.0–100.0)
Monocytes Absolute: 0.5 10*3/uL (ref 0.1–1.0)
Monocytes Relative: 9 %
Neutro Abs: 2.7 10*3/uL (ref 1.7–7.7)
Neutrophils Relative %: 42 %
Platelets: 178 10*3/uL (ref 150–400)
RBC: 3.35 MIL/uL — ABNORMAL LOW (ref 3.87–5.11)
RDW: 15.9 % — ABNORMAL HIGH (ref 11.5–15.5)
WBC: 6.3 10*3/uL (ref 4.0–10.5)
nRBC: 0 % (ref 0.0–0.2)

## 2022-11-30 LAB — IRON AND TIBC
Iron: 75 ug/dL (ref 28–170)
Saturation Ratios: 24 % (ref 10.4–31.8)
TIBC: 308 ug/dL (ref 250–450)
UIBC: 233 ug/dL

## 2022-11-30 LAB — LACTATE DEHYDROGENASE: LDH: 113 U/L (ref 98–192)

## 2022-11-30 LAB — FERRITIN: Ferritin: 106 ng/mL (ref 11–307)

## 2022-11-30 LAB — FOLATE: Folate: 14.2 ng/mL (ref >5.9)

## 2022-11-30 LAB — VITAMIN B12: Vitamin B-12: 537 pg/mL (ref 180–914)

## 2022-11-30 NOTE — Assessment & Plan Note (Signed)
Normocytic anemia, check CBC, smear, CMP, LDH, iron, TIBC ferritin, folate, B12, multiple myeloma panel, light chain ratio,

## 2022-11-30 NOTE — Assessment & Plan Note (Addendum)
Acute pulmonary embolism, provoked by COVID-19 and immobilization during hospitalization. Given patient's age and frailty, I recommend patient to finish 3 months of anticoagulation with Eliquis 5 mg twice daily and then stop. Given that this is a provoked event, I will hold off hypercoagulable workup.

## 2022-11-30 NOTE — Progress Notes (Addendum)
Hematology/Oncology Consult note Telephone:([phone] [phone]        REFERRING PROVIDER: Verlin Dike, MD   CHIEF COMPLAINTS/REASON FOR VISIT:  Evaluation of PE   ASSESSMENT & PLAN:   Acute pulmonary embolism without acute cor pulmonale (HCC) Acute pulmonary embolism, provoked by COVID-19 and immobilization during hospitalization. Given patient's age and frailty, I recommend patient to finish 3 months of anticoagulation with Eliquis 5 mg twice daily and then stop. Given that this is a provoked event, I will hold off hypercoagulable workup.    Anemia Normocytic anemia, check CBC, smear, CMP, LDH, iron, TIBC ferritin, folate, B12, multiple myeloma panel, light chain ratio,   Orders Placed This Encounter  Procedures   Kappa/lambda light chains    Standing Status:   Future    Number of Occurrences:   1    Standing Expiration Date:   12/01/2023   Multiple Myeloma Panel (SPEP&IFE w/QIG)    Standing Status:   Future    Number of Occurrences:   1    Standing Expiration Date:   12/01/2023   CBC with Differential/Platelet    Standing Status:   Future    Number of Occurrences:   1    Standing Expiration Date:   12/01/2023   Comprehensive metabolic panel    Standing Status:   Future    Number of Occurrences:   1    Standing Expiration Date:   12/01/2023   Lactate dehydrogenase    Standing Status:   Future    Number of Occurrences:   1    Standing Expiration Date:   12/01/2023   Iron and TIBC    Standing Status:   Future    Number of Occurrences:   1    Standing Expiration Date:   12/01/2023   Ferritin    Standing Status:   Future    Number of Occurrences:   1    Standing Expiration Date:   05/31/2023   Folate    Standing Status:   Future    Number of Occurrences:   1    Standing Expiration Date:   12/01/2023   Vitamin B12    Standing Status:   Future    Number of Occurrences:   1    Standing Expiration Date:   12/01/2023   Follow-up to be  determined. All questions were answered. The patient knows to call the clinic with any problems, questions or concerns.  Earlie Server, MD, PhD [facility] Hematology Oncology 11/30/2022   HISTORY OF PRESENTING ILLNESS:   Madeline Jimenez is a  88 y.o.  female with PMH listed below was seen in consultation at the request of  Cronk, Angelique Holm, MD  for evaluation of pulmonary embolism.  Patient is a poor historian.  History was obtained from niece as well as chart review. 09/12/2022, patient was tested positive for COVID-19. 09/20/2022 - 10/03/2022, patient was hospitalized due to MRSA bacteremia, right shoulder septic arthritis/abscess, left arm cellulitis.    Right shoulder abscess status post I&D. Patient was treated with antibiotics.  Repeat blood culture was negative.  Echo did not show any endocarditis.  TEE was considered but infectious disease elected to treat for duration of endocarditis and forego TEE.  Patient finished antibiotics course, end date 1226 for doxycycline, 11/06/2022 for daptomycin. 09/21/2022, CT chest angiogram  1. Segmental level pulmonary emboli in the posterior RIGHT upper lobe. 2. Straightening of the interventricular septum is of uncertain significance but may be indicative of elevated  RIGHT heart pressures. Correlation with echocardiography could be considered as warranted. 3. Bilateral areas of consolidation and septal thickening with areas of cavitation. While findings could reflect multifocal pneumonia with areas of necrosis the appearance does raise the question of multifocal bronchogenic pneumonic neoplasm and in addition there are scattered bilateral pulmonary nodules which may be indicative of metastases. Consider pulmonology consultation for further evaluation. Ultimately short interval follow-up, PET or biopsy may be warranted depending on clinical presentation. 4. Thickening of the esophagus with loss of discrete lumen in the mid esophagus. This areas not well  assessed. Correlate with any symptoms of dysphagia. Given the relatively focal appearance of this area consider dedicated esophageal evaluation on a nonemergent basis. This would be best done outside of the acute setting. 5. Top-normal lymph nodes throughout the chest, again reactive versus is neoplastic. 6. Age indeterminate compression fracture at the thoracolumbar junction not grossly changed since previous radiograph from September 12, 2022 no more remote comparison is are available. This is favored to be chronic based on appearance but would correlate with any signs of back pain in this area. 7. Aortic atherosclerosis.8. RIGHT shoulder findings better illustrated on the dedicated shoulder evaluation. 9. Emphysema.  Patient was treated with heparin and transition to Eliquis on 09/25/2022.  Patient was referred to establish care with hematology to determine anticoagulation duration.  Niece reports that patient tolerates Eliquis.  No reports of active bleeding. Patient's appetite is not good.  She has lost weight.  Patient lives with her niece. Patient denies any previous thrombosis events.  MEDICAL HISTORY:  Past Medical History:  Diagnosis Date   COPD (chronic obstructive pulmonary disease) ([place])    HTN (hypertension)    Hyperlipidemia    Hyponatremia    Lymphocytosis    Peripheral vascular disease ([place])    Recurrent UTI     SURGICAL HISTORY: Past Surgical History:  Procedure Laterality Date   ABDOMINAL HYSTERECTOMY     for fibroid   I & D EXTREMITY Right 09/21/2022   Procedure: IRRIGATION AND DEBRIDEMENT OF SHOULDER ABSCESS;  Surgeon: Callie Fielding, MD;  Location: [place];  Service: Orthopedics;  Laterality: Right;   INCISION AND DRAINAGE OF WOUND Left 09/21/2022   Procedure: IRRIGATION AND DEBRIDEMENT LEFT [name];  Surgeon: Callie Fielding, MD;  Location: [place];  Service: Orthopedics;  Laterality: Left;   IR FLUORO GUIDE CV LINE RIGHT  10/02/2022   IR REMOVAL TUN CV CATH W/O  FL  11/07/2022   IR US GUIDE VASC ACCESS RIGHT  10/02/2022   TONSILECTOMY, ADENOIDECTOMY, BILATERAL MYRINGOTOMY AND TUBES      SOCIAL HISTORY: Social History   Socioeconomic History   Marital status: Single    Spouse name: Not on file   Number of children: 1   Years of education: Not on file   Highest education level: Not on file  Occupational History    Comment: retired Secretary/administrator  Tobacco Use   Smoking status: Former    Packs/day: 2.00    Years: 50.00    Total pack years: 100.00    Types: Cigarettes    Quit date: 2009    Years since quitting: 15.1   Smokeless tobacco: Never  Vaping Use   Vaping Use: Never used  Substance and Sexual Activity   Alcohol use: No    Comment: 3 beers a day   Drug use: No   Sexual activity: Not on file  Other Topics Concern   Not on file  Social History Narrative  Not on file   Social Determinants of Health   Financial Resource Strain: Not on file  Food Insecurity: No Food Insecurity (11/30/2022)   Hunger Vital Sign    Worried About Running Out of Food in the Last Year: Never true    Ran Out of Food in the Last Year: Never true  Transportation Needs: No Transportation Needs (11/30/2022)   PRAPARE - Hydrologist (Medical): No    Lack of Transportation (Non-Medical): No  Physical Activity: Not on file  Stress: Not on file  Social Connections: Not on file  Intimate Partner Violence: Not At Risk (11/30/2022)   Humiliation, Afraid, Rape, and Kick questionnaire    Fear of Current or Ex-Partner: No    Emotionally Abused: No    Physically Abused: No    Sexually Abused: No    FAMILY HISTORY: Family History  Problem Relation Age of Onset   Hypertension Father    Heart attack Father    Heart attack Sister     ALLERGIES:  has No Known Allergies.  MEDICATIONS:  Current Outpatient Medications  Medication Sig Dispense Refill   acetaminophen (TYLENOL) 325 MG tablet Take 2 tablets (650 mg total) by mouth  every 6 (six) hours as needed for moderate pain or mild pain.     albuterol (PROVENTIL) (2.5 MG/3ML) 0.083% nebulizer solution Inhale 3 mLs (2.5 mg total) into the lungs every 4 (four) hours as needed for wheezing or shortness of breath. 75 mL 12   amLODipine (NORVASC) 5 MG tablet Take 5 mg by mouth daily.     apixaban (ELIQUIS) 5 MG TABS tablet Take 1 tablet (5 mg total) by mouth 2 (two) times daily. 60 tablet    carvedilol (COREG) 3.125 MG tablet Take 1 tablet (3.125 mg total) by mouth 2 (two) times daily with a meal.     escitalopram (LEXAPRO) 10 MG tablet Take 10 mg by mouth daily.     Multiple Vitamins-Minerals (MULTIVITAMIN WITH MINERALS) tablet Take 1 tablet by mouth daily with breakfast.     TRELEGY ELLIPTA 100-62.5-25 MCG/ACT AEPB Inhale 1 puff into the lungs See admin instructions. Inhale 1 puff into the lungs at 8 AM and 5 PM     oxyCODONE (OXY IR/ROXICODONE) 5 MG immediate release tablet Take 0.5 tablets (2.5 mg total) by mouth every 6 (six) hours as needed for severe pain. (Patient not taking: Reported on 10/27/2022) 30 tablet 0   No current facility-administered medications for this visit.    Review of Systems  Constitutional:  Positive for appetite change, fatigue and unexpected weight change. Negative for chills and fever.  HENT:   Negative for hearing loss and voice change.   Eyes:  Negative for eye problems.  Respiratory:  Negative for chest tightness, cough and shortness of breath.   Cardiovascular:  Negative for chest pain.  Gastrointestinal:  Negative for abdominal distention, abdominal pain and blood in stool.  Endocrine: Negative for hot flashes.  Genitourinary:  Negative for difficulty urinating and frequency.   Musculoskeletal:  Negative for arthralgias.  Skin:  Negative for itching and rash.  Neurological:  Negative for extremity weakness.  Hematological:  Negative for adenopathy.  Psychiatric/Behavioral:  Negative for confusion.    PHYSICAL EXAMINATION: ECOG  PERFORMANCE STATUS: 2 - Symptomatic, <50% confined to bed Vitals:   11/30/22 1003  BP: (!) 132/45  Pulse: (!) 46  Resp: 18  Temp: (!) 96.6 F (35.9 C)   Filed Weights   11/30/22 1003  Weight: 123 lb (55.8 kg)    Physical Exam Constitutional:      General: She is not in acute distress.    Comments: Patient sits in the wheelchair  HENT:     Head: Normocephalic.  Eyes:     General: No scleral icterus. Cardiovascular:     Rate and Rhythm: Normal rate.  Pulmonary:     Effort: Pulmonary effort is normal. No respiratory distress.     Breath sounds: No wheezing.  Abdominal:     General: There is no distension.     Palpations: Abdomen is soft.  Musculoskeletal:        General: No deformity. Normal range of motion.     Cervical back: Normal range of motion.  Skin:    General: Skin is warm and dry.     Coloration: Skin is pale.     Findings: No erythema or rash.  Neurological:     Mental Status: She is alert. Mental status is at baseline.     Cranial Nerves: No cranial nerve deficit.     Comments: Patient answer simple questions.     LABORATORY DATA:  I have reviewed the data as listed    Latest Ref Rng & Units 11/30/2022   10:33 AM 10/03/2022    3:24 AM 10/02/2022   12:35 PM  CBC  WBC 4.0 - 10.5 K/uL 6.3  21.7  24.2   Hemoglobin 12.0 - 15.0 g/dL 10.1  8.3  9.5   Hematocrit 36.0 - 46.0 % 32.2  26.0  29.1   Platelets 150 - 400 K/uL 178  304  350       Latest Ref Rng & Units 11/30/2022   10:33 AM 10/03/2022    3:24 AM 10/02/2022    3:19 AM  CMP  Glucose 70 - 99 mg/dL 103  115  110   BUN 8 - 23 mg/dL '20  26  29   '$ Creatinine 0.44 - 1.00 mg/dL 0.82  1.14  1.10   Sodium 135 - 145 mmol/L 137  141  139   Potassium 3.5 - 5.1 mmol/L 3.8  4.1  3.6   Chloride 98 - 111 mmol/L 99  103  98   CO2 22 - 32 mmol/L 28  32  31   Calcium 8.9 - 10.3 mg/dL 8.9  8.8  8.9   Total Protein 6.5 - 8.1 g/dL 6.5  4.9  5.4   Total Bilirubin 0.3 - 1.2 mg/dL 0.5  0.7  0.8   Alkaline Phos 38  - 126 U/L 85  77  87   AST 15 - 41 U/L 17  26  36   ALT 0 - 44 U/L 13  59  80       RADIOGRAPHIC STUDIES: I have personally reviewed the radiological images as listed and agreed with the findings in the report. IR Removal Tun Cv Cath W/O FL  Result Date: 11/07/2022 INDICATION: History of poor venous access. Patient is status post placement of tunneled right IJ central venous catheter placement on 10/02/2022 for prolonged intravenous therapies. Now no longer needed, request for removal. EXAM: REMOVAL OF TUNNELED CENTRAL VENOUS CATHETER MEDICATIONS: None COMPLICATIONS: None immediate. PROCEDURE: Informed written consent was obtained from the patient following an explanation of the procedure, risks, benefits and alternatives to treatment. A time out was performed prior to the initiation of the procedure. Maximal barrier sterile technique was utilized including caps, mask, sterile gowns, sterile gloves, large sterile drape, hand hygiene, and chlorhexidine. Utilizing  a combination of blunt dissection and gentle traction, the cuff of the catheter was exposed and the catheter was removed intact. Hemostasis was obtained with manual compression. A dressing was placed. The patient tolerated the procedure well without immediate post procedural complication. IMPRESSION: Successful removal of tunneled right IJ central venous catheter. Read by: Ascencion Dike PA-C Electronically Signed   By: Jerilynn Mages.  Shick M.D.   On: 11/07/2022 15:11   IR Fluoro Guide CV Line Right  Result Date: 10/02/2022 INDICATION: Long-term IV antibiotics.  Unable to place PICC. EXAM: ULTRASOUND AND FLUOROSCOPIC GUIDED PLACEMENT OF TUNNELED CENTRAL VENOUS CATHETER MEDICATIONS: None. ANESTHESIA/SEDATION: Local anesthetic was administered. FLUOROSCOPY TIME:  Fluoroscopic dose; 0 mGy COMPLICATIONS: None immediate. PROCEDURE: Informed written consent was obtained from the patient and/or patient's representative after a discussion of the risks, benefits,  and alternatives to treatment. Questions regarding the procedure were encouraged and answered. The RIGHT neck and chest were prepped with chlorhexidine in a sterile fashion, and a sterile drape was applied covering the operative field. Maximum barrier sterile technique with sterile gowns and gloves were used for the procedure. A timeout was performed prior to the initiation of the procedure. After the overlying soft tissues were anesthetized, a small venotomy incision was created and a micropuncture kit was utilized to access the internal jugular vein. Real-time ultrasound guidance was utilized for vascular access including the acquisition of a permanent ultrasound image documenting patency of the accessed vessel. The microwire was utilized to measure appropriate catheter length. The micropuncture sheath was exchanged for a peel-away sheath over a guidewire. A 4 Fr single lumen tunneled central venous catheter measuring 23 cm was tunneled in a retrograde fashion from the anterior chest wall to the venotomy incision. The catheter was then placed through the peel-away sheath with tip ultimately positioned at the superior caval-atrial junction. Final catheter positioning was confirmed and documented with a spot radiographic image. The catheter aspirates and flushes normally. The catheter was flushed with appropriate volume heparin dwells. The catheter exit site was secured with a 0-Prolene retention suture. The venotomy incision was closed wit Dermabond. Dressings were applied. The patient tolerated the procedure well without immediate post procedural complication. FINDINGS: After catheter placement, the tip lies within the superior apect of the right atrium. The catheter aspirates and flushes normally and is ready for immediate use. IMPRESSION: Successful placement of 23 cm single lumen tunneled "Powerline" central venous catheter via the RIGHT internal jugular vein The tip of the catheter is positioned within the  proximal RIGHT atrium. The catheter is ready for immediate use. Michaelle Birks, MD [facility] Electronically Signed   By: Michaelle Birks M.D.   On: 10/02/2022 18:28   IR US Guide Vasc Access Right  Result Date: 10/02/2022 INDICATION: Long-term IV antibiotics.  Unable to place PICC. EXAM: ULTRASOUND AND FLUOROSCOPIC GUIDED PLACEMENT OF TUNNELED CENTRAL VENOUS CATHETER MEDICATIONS: None. ANESTHESIA/SEDATION: Local anesthetic was administered. FLUOROSCOPY TIME:  Fluoroscopic dose; 0 mGy COMPLICATIONS: None immediate. PROCEDURE: Informed written consent was obtained from the patient and/or patient's representative after a discussion of the risks, benefits, and alternatives to treatment. Questions regarding the procedure were encouraged and answered. The RIGHT neck and chest were prepped with chlorhexidine in a sterile fashion, and a sterile drape was applied covering the operative field. Maximum barrier sterile technique with sterile gowns and gloves were used for the procedure. A timeout was performed prior to the initiation of the procedure. After the overlying soft tissues were anesthetized, a small venotomy  incision was created and a micropuncture kit was utilized to access the internal jugular vein. Real-time ultrasound guidance was utilized for vascular access including the acquisition of a permanent ultrasound image documenting patency of the accessed vessel. The microwire was utilized to measure appropriate catheter length. The micropuncture sheath was exchanged for a peel-away sheath over a guidewire. A 4 Fr single lumen tunneled central venous catheter measuring 23 cm was tunneled in a retrograde fashion from the anterior chest wall to the venotomy incision. The catheter was then placed through the peel-away sheath with tip ultimately positioned at the superior caval-atrial junction. Final catheter positioning was confirmed and documented with a spot  radiographic image. The catheter aspirates and flushes normally. The catheter was flushed with appropriate volume heparin dwells. The catheter exit site was secured with a 0-Prolene retention suture. The venotomy incision was closed wit Dermabond. Dressings were applied. The patient tolerated the procedure well without immediate post procedural complication. FINDINGS: After catheter placement, the tip lies within the superior apect of the right atrium. The catheter aspirates and flushes normally and is ready for immediate use. IMPRESSION: Successful placement of 23 cm single lumen tunneled "Powerline" central venous catheter via the RIGHT internal jugular vein The tip of the catheter is positioned within the proximal RIGHT atrium. The catheter is ready for immediate use. Michaelle Birks, MD [facility] Rad[facility] Electronically Signed   By: Michaelle Birks M.D.   On: 10/02/2022 18:28   US Abdomen Limited RUQ (LIVER/GB)  Result Date: 09/30/2022 CLINICAL DATA:  Abnormal liver function tests EXAM: ULTRASOUND ABDOMEN LIMITED RIGHT UPPER QUADRANT COMPARISON:  None Available. FINDINGS: Gallbladder: No gallstones or wall thickening visualized. No sonographic Murphy sign noted by sonographer. Common bile duct: Diameter: 4.4 mm Liver: Echogenicity in liver is unremarkable. There is 2.2 x 1.8 cm cyst in the right lobe. There is another 2 x 1.6 cm cyst in the right lobe. Portal vein is patent on color Doppler imaging with normal direction of blood flow towards the liver. Other: Examination was technically difficult as the patient was lying on the right side. IMPRESSION: There are 2 hepatic cysts. No other sonographic abnormalities are seen in right upper quadrant. Electronically Signed   By: Elmer Picker M.D.   On: 09/30/2022 09:14   [place] EKG SITE RITE  Result Date: 09/29/2022 If Site Rite image not attached, placement could not be confirmed due to current cardiac  rhythm.  DG Abd Portable 1V  Result Date: 09/24/2022 CLINICAL DATA:  Encounter for NG tube placement. EXAM: PORTABLE ABDOMEN - 1 VIEW COMPARISON:  09/21/2022 FINDINGS: The nasogastric tube in place with tip and side port in the expected location of the gastric antrum. No dilated bowel loops identified. Scoliosis involves the thoracolumbar spine. IMPRESSION: Nasogastric tube tip and side port are in the expected location of the gastric antrum. Electronically Signed   By: Kerby Moors M.D.   On: 09/24/2022 08:53   DG CHEST PORT 1 VIEW  Result Date: 09/24/2022 CLINICAL DATA:  Lung infiltrates.  Follow-up exam. EXAM: PORTABLE CHEST 1 VIEW COMPARISON:  09/22/2022 and older studies. FINDINGS: Focal airspace opacities in the upper lobes are unchanged. No new lung abnormalities. No convincing pleural effusion and no pneumothorax. Endotracheal tube is well position. Nasal/orogastric tube has retracted, tip now in the upper thoracic esophagus. IMPRESSION: 1. No change in the bilateral focal airspace lung opacities. No new lung abnormalities. 2. Nasal/orogastric tube has retracted, tip now projecting in the upper thoracic esophagus. Critical  Value/emergent results were called by telephone at the time of interpretation on 09/24/2022 at 8:00 am to the charge nurse, who verbally acknowledged these results. Electronically Signed   By: Lajean Manes M.D.   On: 09/24/2022 08:00   [place] EKG SITE RITE  Result Date: 09/23/2022 If Site Rite image not attached, placement could not be confirmed due to current cardiac rhythm.  VAS [place] LOWER EXTREMITY VENOUS (DVT)  Result Date: 09/22/2022  Lower Venous DVT Study Patient Name:  [facility]  Date of Exam:   09/22/2022 Medical Rec #: [id number]         Accession #:    [id number] Date of Birth: Apr 20, 1935         Patient Gender: F Patient Age:   16 years Exam Location:  [facility] Procedure:      VAS [place] LOWER EXTREMITY VENOUS (DVT) Referring Phys: Montey Hora  --------------------------------------------------------------------------------  Indications: Pulmonary embolism.  Limitations: Poor ultrasound/tissue interface and body habitus. Comparison Study: No previous exams Performing Technologist: Jody Hill RVT, RDMS  Examination Guidelines: A complete evaluation includes B-mode imaging, spectral Doppler, color Doppler, and power Doppler as needed of all accessible portions of each vessel. Bilateral testing is considered an integral part of a complete examination. Limited examinations for reoccurring indications may be performed as noted. The reflux portion of the exam is performed with the patient in reverse Trendelenburg.  +---------+---------------+---------+-----------+----------+--------------+ RIGHT    CompressibilityPhasicitySpontaneityPropertiesThrombus Aging +---------+---------------+---------+-----------+----------+--------------+ CFV      Full           Yes      Yes                                 +---------+---------------+---------+-----------+----------+--------------+ SFJ      Full                                                        +---------+---------------+---------+-----------+----------+--------------+ FV Prox  Full           Yes      Yes                                 +---------+---------------+---------+-----------+----------+--------------+ FV Mid   Full           Yes      Yes                                 +---------+---------------+---------+-----------+----------+--------------+ FV DistalFull           Yes      Yes                                 +---------+---------------+---------+-----------+----------+--------------+ PFV      Full                                                        +---------+---------------+---------+-----------+----------+--------------+ POP      Full  Yes      Yes                                  +---------+---------------+---------+-----------+----------+--------------+ PTV      Full                                                        +---------+---------------+---------+-----------+----------+--------------+ PERO     Full                                                        +---------+---------------+---------+-----------+----------+--------------+   +---------+---------------+---------+-----------+----------+--------------+ LEFT     CompressibilityPhasicitySpontaneityPropertiesThrombus Aging +---------+---------------+---------+-----------+----------+--------------+ CFV      Full           Yes      Yes                                 +---------+---------------+---------+-----------+----------+--------------+ SFJ      Full                                                        +---------+---------------+---------+-----------+----------+--------------+ FV Prox  Full           Yes      Yes                                 +---------+---------------+---------+-----------+----------+--------------+ FV Mid   Full           Yes      Yes                                 +---------+---------------+---------+-----------+----------+--------------+ FV DistalFull           Yes      Yes                                 +---------+---------------+---------+-----------+----------+--------------+ PFV      Full                                                        +---------+---------------+---------+-----------+----------+--------------+ POP      Full           Yes      Yes                                 +---------+---------------+---------+-----------+----------+--------------+ PTV      Full                                                        +---------+---------------+---------+-----------+----------+--------------+  PERO     Full                                                         +---------+---------------+---------+-----------+----------+--------------+     Summary: BILATERAL: - No evidence of deep vein thrombosis seen in the lower extremities, bilaterally. -No evidence of popliteal cyst, bilaterally.   *See table(s) above for measurements and observations. Electronically signed by Servando Snare MD on 09/22/2022 at 5:16:47 PM.    Final    ECHOCARDIOGRAM COMPLETE  Result Date: 09/22/2022    ECHOCARDIOGRAM REPORT   Patient Name:   [facility] Date of Exam: 09/22/2022 Medical Rec #:  [id number]        Height:       64.0 in Accession #:    [id number]       Weight:       127.9 lb Date of Birth:  [birth year]        BSA:          1.618 m Patient Age:    79 years         BP:           131/57 mmHg Patient Gender: F                HR:           72 bpm. Exam Location:  Inpatient Procedure: 2D Echo, Cardiac Doppler, Color Doppler and Intracardiac            Opacification Agent Indications:     Bacteremia R78.81  History:         Patient has no prior history of Echocardiogram examinations.                  Signs/Symptoms:Shortness of Breath; Risk Factors:Dyslipidemia,                  Hypertension and Former Smoker.  Sonographer:     Greer Pickerel Referring Phys:  [id number] [place] A MOORE Diagnosing Phys: Dixie Dials MD  Sonographer Comments: Technically challenging study due to limited acoustic windows, suboptimal parasternal window, suboptimal apical window, suboptimal subcostal window and echo performed with patient supine and on artificial respirator. Image acquisition challenging due to patient body habitus and Image acquisition challenging due to respiratory motion. IMPRESSIONS  1. Left ventricular ejection fraction, by estimation, is 50 to 55%. The left ventricle has low normal function. The left ventricle has no regional wall motion abnormalities. Left ventricular diastolic parameters are consistent with Grade I diastolic dysfunction (impaired relaxation).  2. Right ventricular systolic  function is low normal. The right ventricular size is normal. There is normal pulmonary artery systolic pressure.  3. Left atrial size was moderately dilated.  4. There is no evidence of cardiac tamponade.  5. Can not exclude vegetation.. The mitral valve is degenerative. Mild mitral valve regurgitation. Moderate mitral annular calcification.  6. Can not exclude vegetation. The aortic valve is tricuspid. There is moderate calcification of the aortic valve. There is mild thickening of the aortic valve. Aortic valve regurgitation is not visualized. Aortic valve sclerosis/calcification is present, without any evidence of aortic stenosis.  7. There is Moderate (Grade III) atheroma plaque involving the aortic root and ascending aorta.  8. The inferior vena cava is dilated in size with <50% respiratory variability, suggesting right  atrial pressure of 15 mmHg. FINDINGS  Left Ventricle: Left ventricular ejection fraction, by estimation, is 50 to 55%. The left ventricle has low normal function. The left ventricle has no regional wall motion abnormalities. Definity contrast agent was given IV to delineate the left ventricular endocardial borders. The left ventricular internal cavity size was normal in size. There is no left ventricular hypertrophy. Left ventricular diastolic parameters are consistent with Grade I diastolic dysfunction (impaired relaxation). Right Ventricle: The right ventricular size is normal. No increase in right ventricular wall thickness. Right ventricular systolic function is low normal. There is normal pulmonary artery systolic pressure. The tricuspid regurgitant velocity is 2.29 m/s,  and with an assumed right atrial pressure of 15 mmHg, the estimated right ventricular systolic pressure is 123XX123 mmHg. Left Atrium: Left atrial size was moderately dilated. Right Atrium: Right atrial size was normal in size. Pericardium: Trivial pericardial effusion is present. There is no evidence of cardiac tamponade.  Mitral Valve: Can not exclude vegetation. The mitral valve is degenerative in appearance. There is mild thickening of the posterior mitral valve leaflet(s). There is moderate calcification of the posterior mitral valve leaflet(s). Moderate mitral annular  calcification. Mild mitral valve regurgitation. Tricuspid Valve: The tricuspid valve is normal in structure. Tricuspid valve regurgitation is mild. Aortic Valve: Can not exclude vegetation. The aortic valve is tricuspid. There is moderate calcification of the aortic valve. There is mild thickening of the aortic valve. There is moderate aortic valve annular calcification. Aortic valve regurgitation is not visualized. Aortic valve sclerosis/calcification is present, without any evidence of aortic stenosis. Pulmonic Valve: The pulmonic valve was normal in structure. Pulmonic valve regurgitation is not visualized. Aorta: The aortic root is normal in size and structure. There is moderate (Grade III) atheroma plaque involving the aortic root and ascending aorta. Venous: The inferior vena cava is dilated in size with less than 50% respiratory variability, suggesting right atrial pressure of 15 mmHg. IAS/Shunts: The atrial septum is grossly normal.  LEFT VENTRICLE PLAX 2D LVOT diam:     1.70 cm     Diastology LV SV:         56          LV e' medial:    5.77 cm/s LV SV Index:   35          LV E/e' medial:  13.7 LVOT Area:     2.27 cm    LV e' lateral:   5.44 cm/s                            LV E/e' lateral: 14.5  LV Volumes (MOD) LV vol d, MOD A2C: 89.0 ml LV vol d, MOD A4C: 76.0 ml LV vol s, MOD A2C: 57.3 ml LV vol s, MOD A4C: 33.6 ml LV SV MOD A2C:     31.7 ml LV SV MOD A4C:     76.0 ml LV SV MOD BP:      37.4 ml RIGHT VENTRICLE RV S prime:     21.20 cm/s TAPSE (M-mode): 1.7 cm LEFT ATRIUM            Index        RIGHT ATRIUM           Index LA diam:      4.20 cm  2.60 cm/m   RA Area:     27.80 cm LA Vol (A2C): 67.4 ml  41.67 ml/m  RA Volume:  105.00 ml 64.91 ml/m LA  Vol (A4C): 106.0 ml 65.53 ml/m  AORTIC VALVE LVOT Vmax:   114.00 cm/s LVOT Vmean:  82.500 cm/s LVOT VTI:    0.248 m  AORTA Ao Root diam: 3.20 cm MITRAL VALVE               TRICUSPID VALVE MV Area (PHT): 2.44 cm    TV Peak grad:   25.6 mmHg MV Decel Time: 311 msec    TV Vmax:        2.53 m/s MV E velocity: 78.80 cm/s  TR Peak grad:   21.0 mmHg MV A velocity: 79.30 cm/s  TR Vmax:        229.00 cm/s MV E/A ratio:  0.99                            SHUNTS                            Systemic VTI:  0.25 m                            Systemic Diam: 1.70 cm Dixie Dials MD Electronically signed by Dixie Dials MD Signature Date/Time: 09/22/2022/11:26:23 AM    Final    Portable Chest xray  Result Date: 09/22/2022 CLINICAL DATA:  Respiratory failure. EXAM: PORTABLE CHEST 1 VIEW COMPARISON:  CXR 09/20/22, CT PE 09/21/22 FINDINGS: The endotracheal tube is below the thoracic inlet and above the carina. Enteric tube courses below diaphragm with the tip out of the field of view and side hole projecting over the expected location of the stomach. No pleural effusion. Redemonstrated bilateral upper lobe consolidative opacities with cavitation on the left. These are better assessed on recent CT PE protocol study. Cardiac and mediastinal contours are otherwise unchanged. Visualized upper abdomen is unremarkable. Redemonstrated healing left-sided rib fractures. There is an apparent pleural line at the left lung apex, favored to represent a skin fold. IMPRESSION: 1. Redemonstrated bilateral upper lobe consolidative opacities with cavitation on the left. These are better assessed on recent CT PE protocol study. 2. There is an apparent pleural line at the left lung apex, favored to represent a skin fold. Attention on follow-up. 3. Endotracheal tube is below the thoracic inlet and above the carina. Enteric tube projects over the expected location of the stomach. Electronically Signed   By: Marin Roberts M.D.   On: 09/22/2022 08:32   DG  Abd 1 View  Result Date: 09/21/2022 CLINICAL DATA:  OG tube placement EXAM: ABDOMEN - 1 VIEW COMPARISON:  CT 09/21/2022 FINDINGS: Cardiomegaly. Esophageal tube tip overlies the proximal stomach. Contrast within the renal collecting systems IMPRESSION: Esophageal tube tip overlies the proximal stomach. Electronically Signed   By: Donavan Foil M.D.   On: 09/21/2022 21:50   DG Forearm Left  Result Date: 09/21/2022 CLINICAL DATA:  Pain EXAM: LEFT FOREARM - 2 VIEW COMPARISON:  None Available. FINDINGS: There is no evidence of fracture or other focal bone lesions. Soft tissues are unremarkable. IMPRESSION: No fracture or dislocation of the left radius or ulna. Electronically Signed   By: Delanna Ahmadi M.D.   On: 09/21/2022 20:40   CT Angio Chest Pulmonary Embolism (PE) W or WO Contrast  Result Date: 09/21/2022 CLINICAL DATA:  An 88 year old female presents with history of shortness of breath. EXAM: CT ANGIOGRAPHY CHEST WITH CONTRAST  TECHNIQUE: Multidetector CT imaging of the chest was performed using the standard protocol during bolus administration of intravenous contrast. Multiplanar CT image reconstructions and MIPs were obtained to evaluate the vascular anatomy. RADIATION DOSE REDUCTION: This exam was performed according to the departmental dose-optimization program which includes automated exposure control, adjustment of the mA and/or kV according to patient size and/or use of iterative reconstruction technique. CONTRAST:  57m OMNIPAQUE IOHEXOL 350 MG/ML SOLN COMPARISON:  None available aside from shoulder imaging of the same date. FINDINGS: Cardiovascular: Calcified and noncalcified aortic atherosclerotic plaque in the thoracic aorta. No aneurysmal dilation. Small pericardial effusion. Normal heart size. Straightening of the interventricular septum. Main pulmonary artery is well opacified centrally with the density of 565 Hounsfield units. Emboli in segmental level branches to posterior RIGHT upper lobe,  also in posterior RIGHT lower lobe branches at the segmental and subsegmental level. No additional signs of pulmonary embolism. No lobar level or central pulmonary embolism. Mediastinum/Nodes: Mildly patulous esophagus. No thoracic inlet, axillary or hilar adenopathy. No signs of mediastinal adenopathy though there are top-normal size lymph nodes throughout the pre-vascular space and in the AP window largest approximately 10 mm (image 55/5) also precarinal lymph node 11 mm (image 52/5) 11 mm. (Image 72/5) 11 mm LEFT hilar lymph node. Thickening of the esophagus with loss of discrete lumen in the mid esophagus (image 61/5) significance uncertain. Not substantially dilated above this level. Lungs/Pleura: Bilateral areas of consolidation and septal thickening. Masslike consolidation in the RIGHT upper lobe (image 32/6) 5.3 x 2.2 cm. Peripheral masslike consolidation in the RIGHT upper lobe (image 50/6) 4.5 x 5.6 cm. Scattered pulmonary nodules in the RIGHT chest, largest discrete nodule 10 mm (image 59/6) this is in the medial RIGHT lower lobe. Cavitary appearing area with lucency along the margin of the pleural surface associated with does are peripheral septal thickening and consolidative changes along the periphery of the LEFT upper lobe measuring 7.4 x 3.5 cm. Similar appearing area in the LEFT lower lobe superior segment (image 78/5) 3.0 x 1.8 cm with scattered smaller foci of nodularity throughout the LEFT chest. Background pulmonary emphysema. No pleural effusion or sign of consolidative changes. Upper Abdomen: Limited assessment of upper abdominal contents. Imaged portions of liver, spleen, and adrenal glands are unremarkable. Musculoskeletal: Age indeterminate compression fracture which is seen in the lower thoracic spine not grossly changed since previous radiograph from September 12, 2022 no more remote comparison is are available. Spinal degenerative changes. Some degenerative changes appear to surround the  compression fracture at the thoracolumbar junction. Stranding about the RIGHT shoulder in this patient with suspected abscess with cellulitis and myositis. Review of the MIP images confirms the above findings. IMPRESSION: 1. Segmental level pulmonary emboli in the posterior RIGHT upper lobe. 2. Straightening of the interventricular septum is of uncertain significance but may be indicative of elevated RIGHT heart pressures. Correlation with echocardiography could be considered as warranted. 3. Bilateral areas of consolidation and septal thickening with areas of cavitation. While findings could reflect multifocal pneumonia with areas of necrosis the appearance does raise the question of multifocal bronchogenic pneumonic neoplasm and in addition there are scattered bilateral pulmonary nodules which may be indicative of metastases. Consider pulmonology consultation for further evaluation. Ultimately short interval follow-up, PET or biopsy may be warranted depending on clinical presentation. 4. Thickening of the esophagus with loss of discrete lumen in the mid esophagus. This areas not well assessed. Correlate with any symptoms of dysphagia. Given the relatively focal appearance of this area  consider dedicated esophageal evaluation on a nonemergent basis. This would be best done outside of the acute setting. 5. Top-normal lymph nodes throughout the chest, again reactive versus is neoplastic. 6. Age indeterminate compression fracture at the thoracolumbar junction not grossly changed since previous radiograph from September 12, 2022 no more remote comparison is are available. This is favored to be chronic based on appearance but would correlate with any signs of back pain in this area. 7. Aortic atherosclerosis. 8. RIGHT shoulder findings better illustrated on the dedicated shoulder evaluation. 9. Emphysema. Aortic Atherosclerosis (ICD10-I70.0) and Emphysema (ICD10-J43.9). Critical Value/emergent results were called by  telephone at the time of interpretation on 09/21/2022 at 4:34 pm to provider Dr. Gustavus Messing, Who verbally acknowledged these results. A Electronically Signed   By: Zetta Bills M.D.   On: 09/21/2022 16:34   CT SOFT TISSUE NECK W CONTRAST  Result Date: 09/21/2022 CLINICAL DATA:  Soft tissue infection suspected. EXAM: CT NECK WITH CONTRAST TECHNIQUE: Multidetector CT imaging of the neck was performed using the standard protocol following the bolus administration of intravenous contrast. RADIATION DOSE REDUCTION: This exam was performed according to the departmental dose-optimization program which includes automated exposure control, adjustment of the mA and/or kV according to patient size and/or use of iterative reconstruction technique. CONTRAST:  53m OMNIPAQUE IOHEXOL 350 MG/ML SOLN COMPARISON:  Chest radiograph 09/20/2022 FINDINGS: Pharynx and larynx: Bilateral tonsils are normal in appearance. The epiglottis is normal in appearance. No evidence of odontogenic soft tissue abscess. No evidence of retropharyngeal edema. Bilateral aryepiglottic folds are normal in appearance. Salivary glands: No inflammation, mass, or stone. Thyroid: Normal. Lymph nodes: None enlarged or abnormal density. Vascular: Negative. Limited intracranial: Negative. Visualized orbits: Bilateral lens replacement. Mastoids and visualized paranasal sinuses: Mucosal thickening bilateral maxillary sinuses. No mastoid effusion. Skeleton: No acute or aggressive process. Upper chest: There are hypoenhancing consolidative opacities in the bilateral upper lobes as well as the right lung apex. There is likely an acute PE in a right upper lobe segmental pulmonary artery (series 3, image 135). There are prominent mediastinal lymph nodes which are nonspecific, and possibly reactive. There is focal soft tissue thickening and a possible fluid collection superior to the right AC joint. Please see separately dictated CT of the shoulder for characterization of  findings are in the right glenohumeral and AC joints. Other: Patient is nearly edentulous. IMPRESSION: 1. No evidence of soft tissue infection in the neck. There is soft tissue stranding surrounding the right glenohumeral and AC joints with a possible fluid collection superior to the right AC joint. Please see separately dictated CT of the shoulder for characterization of these findings. 2. Hypoenhancing consolidative opacities in the bilateral upper lobes as well as the right lung apex, concerning for multifocal pneumonia. 3. Probable acute PE in a right upper lobe segmental pulmonary artery. Recommend further evaluation with a dedicated CT PE protocol study. Electronically Signed   By: HMarin RobertsM.D.   On: 09/21/2022 14:42   CT SHOULDER RIGHT W CONTRAST  Result Date: 09/21/2022 CLINICAL DATA:  Right shoulder pain and swelling. Clinical suspicion of septic arthritis. EXAM: CT OF THE UPPER RIGHT EXTREMITY WITH CONTRAST TECHNIQUE: Multidetector CT imaging of the upper right extremity was performed according to the standard protocol following intravenous contrast administration. RADIATION DOSE REDUCTION: This exam was performed according to the departmental dose-optimization program which includes automated exposure control, adjustment of the mA and/or kV according to patient size and/or use of iterative reconstruction technique. CONTRAST:  733mOMNIPAQUE IOHEXOL 350  MG/ML SOLN COMPARISON:  None Available. FINDINGS: The glenohumeral joint is maintained. No CT findings suspicious for septic arthritis or osteomyelitis. The [facility] joint is intact. Mild degenerative changes. No erosive findings or destructive bony changes to suggest septic arthritis or osteomyelitis. In the subcutaneous tissues overlying the right clavicle there is a rim enhancing fluid collection suspicious for abscess and measuring a maximum of 4.4 x 1.5 cm. Associated surrounding cellulitis and myositis. Grossly by CT the rotator cuff tendons are  intact. Extensive pulmonary infiltrates are noted in the right upper lobe and also in the left lower lobe. The ribs are grossly intact. The sternoclavicular joint is intact. IMPRESSION: 1. 4.4 x 1.5 cm rim enhancing fluid collection in the subcutaneous tissues overlying the right clavicle suspicious for abscess. Associated surrounding cellulitis and myositis. 2. No CT findings suspicious for septic arthritis or osteomyelitis at the [facility] joint or glenohumeral joint. 3. Extensive pulmonary infiltrates in the right upper lobe and also in the left lower lobe. Electronically Signed   By: Marijo Sanes M.D.   On: 09/21/2022 14:39   DG Shoulder Right  Result Date: 09/21/2022 CLINICAL DATA:  Fall, shoulder pain EXAM: RIGHT SHOULDER - 2+ VIEW COMPARISON:  None Available. FINDINGS: Degenerative changes in the right shoulder. Joint space narrowing and spurring in the [facility] joint. Spurring at the rotator cuff insertion on the greater tuberosity. No acute bony abnormality. Specifically, no fracture, subluxation, or dislocation. Patchy airspace opacity in the right upper lobe as seen on prior chest x-ray. IMPRESSION: Degenerative changes.  No acute bony abnormality. Electronically Signed   By: Rolm Baptise M.D.   On: 09/21/2022 00:54   DG Chest Port 1 View  Result Date: 09/20/2022 CLINICAL DATA:  Questionable sepsis EXAM: PORTABLE CHEST 1 VIEW COMPARISON:  09/12/2022 FINDINGS: Cardiomegaly. Patchy opacities in the upper lobes, right greater than left. No visible effusions. No acute bony abnormality. Old left rib fractures. IMPRESSION: Patchy opacities in the upper lobes, right greater than left. This could reflect pneumonia. Peripheral opacity in the left upper lobe is somewhat nodular. Recommend follow up to clearance. Electronically Signed   By: Rolm Baptise M.D.   On: 09/20/2022 22:31   DG Chest 2 View  Result Date: 09/12/2022 CLINICAL DATA:  Sepsis, fever, cough EXAM: CHEST - 2 VIEW COMPARISON:  03/07/2022 FINDINGS:  Lungs are clear. No pneumothorax. Eventration of the right hemidiaphragm noted. Superimposed small bilateral pleural effusions are present. Cardiac size is mildly enlarged, stable. Pulmonary vascularity is normal. No acute bone abnormality. Healed left rib fractures noted. Degenerative changes noted within the right shoulder. Remote T12 compression deformity with moderate loss of height and mild resultant focal kyphosis is noted IMPRESSION: 1. Small bilateral pleural effusions. 2. Mild cardiomegaly. Electronically Signed   By: Fidela Salisbury M.D.   On: 09/12/2022 12:07

## 2022-12-01 LAB — KAPPA/LAMBDA LIGHT CHAINS
Kappa free light chain: 28.8 mg/L — ABNORMAL HIGH (ref 3.3–19.4)
Kappa, lambda light chain ratio: 1.66 — ABNORMAL HIGH (ref 0.26–1.65)
Lambda free light chains: 17.3 mg/L (ref 5.7–26.3)

## 2022-12-06 LAB — MULTIPLE MYELOMA PANEL, SERUM
Albumin SerPl Elph-Mcnc: 3.6 g/dL (ref 2.9–4.4)
Albumin/Glob SerPl: 1.4 (ref 0.7–1.7)
Alpha 1: 0.3 g/dL (ref 0.0–0.4)
Alpha2 Glob SerPl Elph-Mcnc: 0.6 g/dL (ref 0.4–1.0)
B-Globulin SerPl Elph-Mcnc: 0.9 g/dL (ref 0.7–1.3)
Gamma Glob SerPl Elph-Mcnc: 0.9 g/dL (ref 0.4–1.8)
Globulin, Total: 2.7 g/dL (ref 2.2–3.9)
IgA: 62 mg/dL — ABNORMAL LOW (ref 64–422)
IgG (Immunoglobin G), Serum: 998 mg/dL (ref 586–1602)
IgM (Immunoglobulin M), Srm: 57 mg/dL (ref 26–217)
Total Protein ELP: 6.3 g/dL (ref 6.0–8.5)

## 2022-12-07 ENCOUNTER — Ambulatory Visit (HOSPITAL_COMMUNITY)
Admission: RE | Admit: 2022-12-07 | Discharge: 2022-12-07 | Disposition: A | Discharge status: Home / Self Care | Payer: Medicare Other | Source: Ambulatory Visit | Attending: Family Medicine | Admitting: Family Medicine

## 2022-12-07 DIAGNOSIS — R911 Solitary pulmonary nodule: Secondary | ICD-10-CM

## 2023-01-04 ENCOUNTER — Telehealth: Payer: Self-pay

## 2023-01-04 NOTE — Telephone Encounter (Signed)
-----   Message from Earlie Server, MD sent at 01/04/2023  9:06 AM EDT ----- His blood work up results are fine. No additional work up needed.  Recommend her to finish total 3 months of blood thinners [ till Mid March]  and stop.  Recommend follow up with PCP

## 2023-01-04 NOTE — Telephone Encounter (Signed)
Called and spoke to pt's niece, susan. She states pt is hard of hearing and she will inform her of MD recommendation.

## 2023-01-31 NOTE — Hospital Course (Addendum)
88 year old admitted from home  with a fall Tmax 101 on arrival had been short of breath 4 days prior to arrival  with coronavirus 19 infection-f  lactic acid 2.2 platelets 125 WBC 10.5 creatinine worse than baseline at Cre 1.4 visibly tachypneic  patient given boluses of fluids  Solu-Medrol nebulizations  felt to also have superimposed secondary bacterialpossible  COPD at the time of admit--sinus bryd on EKG, QTC prolongation--no arrythmia  Rx Molnupiravir--plan for 10 days steroids as per note 11/29--consideraiton given to ECHO given Plerual effusion/Cardiomegally etc. Walked 100 ft with mobility Seen the next day by team [another attnedning--not admitting attending] and d/c 11/30--walked 120 ft by the next day--discharged appropriately on 8 days of decadron as well as Molnupiravir compeltion doses  Return to ED 12/6--SOBagain this time, increased WOB, crackly wet sounding--NSR 130's WBC66, ANC 42--R shoulder abscess  12/7--Resident note cosigned shows "Get Ct rule out PE', also plan is "prolonged QTC--not sure if the resident saw the CT PE study was +--ortho pedics consutloed/Id consulted  2 issues-- no formal EKG read with specifics of intervals on either EKG--there is a brief 1 lne narrative HPI cosigned shows PE and started on IV heprain--resident note the following day

## 2023-06-22 ENCOUNTER — Ambulatory Visit: Payer: Self-pay

## 2023-12-31 ENCOUNTER — Emergency Department (HOSPITAL_COMMUNITY)

## 2023-12-31 ENCOUNTER — Other Ambulatory Visit: Payer: Self-pay

## 2023-12-31 ENCOUNTER — Encounter (HOSPITAL_COMMUNITY): Payer: Self-pay | Admitting: Emergency Medicine

## 2023-12-31 ENCOUNTER — Emergency Department (HOSPITAL_COMMUNITY)
Admission: EM | Admit: 2023-12-31 | Discharge: 2024-01-03 | Disposition: A | Discharge status: Home / Self Care | Attending: Emergency Medicine | Admitting: Emergency Medicine

## 2023-12-31 DIAGNOSIS — W010XXA Fall on same level from slipping, tripping and stumbling without subsequent striking against object, initial encounter: Secondary | ICD-10-CM | POA: Diagnosis not present

## 2023-12-31 DIAGNOSIS — W19XXXA Unspecified fall, initial encounter: Secondary | ICD-10-CM

## 2023-12-31 DIAGNOSIS — M25461 Effusion, right knee: Secondary | ICD-10-CM | POA: Diagnosis not present

## 2023-12-31 DIAGNOSIS — M25469 Effusion, unspecified knee: Secondary | ICD-10-CM

## 2023-12-31 DIAGNOSIS — Z7901 Long term (current) use of anticoagulants: Secondary | ICD-10-CM | POA: Insufficient documentation

## 2023-12-31 DIAGNOSIS — J449 Chronic obstructive pulmonary disease, unspecified: Secondary | ICD-10-CM | POA: Insufficient documentation

## 2023-12-31 DIAGNOSIS — M25561 Pain in right knee: Secondary | ICD-10-CM | POA: Diagnosis present

## 2023-12-31 MED ORDER — DIAZEPAM 2 MG PO TABS
2.0000 mg | ORAL_TABLET | Freq: Once | ORAL | Status: AC
  Administered 2023-12-31: 2 mg via ORAL
  Filled 2023-12-31: qty 1

## 2023-12-31 MED ORDER — OXYCODONE-ACETAMINOPHEN 5-325 MG PO TABS
1.0000 | ORAL_TABLET | Freq: Four times a day (QID) | ORAL | Status: DC | PRN
  Administered 2024-01-01 – 2024-01-02 (×3): 1 via ORAL
  Filled 2023-12-31 (×3): qty 1

## 2023-12-31 MED ORDER — OXYCODONE-ACETAMINOPHEN 5-325 MG PO TABS
1.0000 | ORAL_TABLET | Freq: Once | ORAL | Status: AC
  Administered 2023-12-31: 1 via ORAL
  Filled 2023-12-31: qty 1

## 2023-12-31 MED ORDER — ACETAMINOPHEN 325 MG PO TABS
650.0000 mg | ORAL_TABLET | Freq: Four times a day (QID) | ORAL | Status: DC | PRN
Start: 2023-12-31 — End: 2024-01-03

## 2023-12-31 NOTE — ED Triage Notes (Addendum)
 Per EMS: While attempting to move from her wheelchair to her toilet at home, she fell and landed on her right knee. She did not hit her head, she is not on blood thinners. EMS noted no deformity to the leg. Patient complains of pain with movement making extension of the leg difficult.   Per patient: She fell while in her driveway using her cane. She states EMS had to pick her up from the ground. She says she did not hit her head.   EMS vitals: 92-94% SPO2 on 3L O2 (chronic O2 use) 132/86 BP 68 P

## 2023-12-31 NOTE — ED Provider Notes (Signed)
 [place] EMERGENCY DEPARTMENT AT [facility] Provider Note   CSN: [id number] Arrival date & time: 12/31/23  1547     History  Chief Complaint  Patient presents with   Fall   Knee Pain    Madeline Jimenez is a 89 y.o. female, PE, COPD, who presents to the ED after a fall, in the driveway, when she was walking across some gravel.  She states she just kind of tripped, because the gravel was and even, and she fell on her right knee.  States since then, she has been unable to put pressure on it, and senses that it has been flexed, and she will not walk on it or put any pressure on it.  She denies any history, of knee replacements on this knee.  Did not hit head, is not on blood thinners.  Home Medications Prior to Admission medications   Medication Sig Start Date End Date Taking? Authorizing Provider  acetaminophen (TYLENOL) 325 MG tablet Take 2 tablets (650 mg total) by mouth every 6 (six) hours as needed for moderate pain or mild pain. 10/03/22   Shelby Mattocks, DO  albuterol (PROVENTIL) (2.5 MG/3ML) 0.083% nebulizer solution Inhale 3 mLs (2.5 mg total) into the lungs every 4 (four) hours as needed for wheezing or shortness of breath. 10/03/22   Shelby Mattocks, DO  amLODipine (NORVASC) 5 MG tablet Take 5 mg by mouth daily. 03/06/22   [provider]  apixaban (ELIQUIS) 5 MG TABS tablet Take 1 tablet (5 mg total) by mouth 2 (two) times daily. 10/03/22   Shelby Mattocks, DO  carvedilol (COREG) 3.125 MG tablet Take 1 tablet (3.125 mg total) by mouth 2 (two) times daily with a meal. 10/03/22   Shelby Mattocks, DO  escitalopram (LEXAPRO) 10 MG tablet Take 10 mg by mouth daily. 01/26/22   [provider]  Multiple Vitamins-Minerals (MULTIVITAMIN WITH MINERALS) tablet Take 1 tablet by mouth daily with breakfast.    [provider]  oxyCODONE (OXY IR/ROXICODONE) 5 MG immediate release tablet Take 0.5 tablets (2.5 mg total) by mouth every 6 (six) hours as needed  for severe pain. Patient not taking: Reported on 10/27/2022 10/03/22   Shelby Mattocks, DO  TRELEGY ELLIPTA 100-62.5-25 MCG/ACT AEPB Inhale 1 puff into the lungs See admin instructions. Inhale 1 puff into the lungs at 8 AM and 5 PM 02/13/22   [provider]      Allergies    Patient has no known allergies.    Review of Systems   Review of Systems  Musculoskeletal:  Positive for arthralgias.    Physical Exam Updated Vital Signs BP (!) 164/70   Pulse 78   Temp 97.9 F (36.6 C) (Oral)   Resp 16   SpO2 98%  Physical Exam Vitals and nursing note reviewed.  Constitutional:      General: She is not in acute distress.    Appearance: She is well-developed.  HENT:     Head: Normocephalic and atraumatic.  Eyes:     General:        Right eye: No discharge.        Left eye: No discharge.     Conjunctiva/sclera: Conjunctivae normal.  Pulmonary:     Effort: No respiratory distress.  Musculoskeletal:     Comments: Right Knee: Tenderness to palpation of medial joint line. An effusion is not present.  Negative anterior and posterior drawer. Negative Mcmurray's. +Patellar stability. Passive extension intact, but will not actively extend leg. Withdraws in  pain 2/2 to extension. No sensory deficits.    Neurological:     Mental Status: She is alert.     Comments: Clear speech.   Psychiatric:        Behavior: Behavior normal.        Thought Content: Thought content normal.     ED Results / Procedures / Treatments   Labs (all labs ordered are listed, but only abnormal results are displayed) Labs Reviewed - No data to display  EKG None  Radiology DG Knee Complete 4 Views Right Result Date: 12/31/2023 CLINICAL DATA:  Status post fall. EXAM: RIGHT KNEE - COMPLETE 4+ VIEW COMPARISON:  December 31, 2023 (5:19 p.m.) FINDINGS: No evidence of an acute fracture or dislocation. An ill-defined chronic appearing deformity is seen along the neck of the proximal right fibula. There is marked  severity tricompartmental joint space narrowing. A Nohea Kras suprapatellar effusion is present. IMPRESSION: 1. Marked severity tricompartmental degenerative changes. 2. Ocie Tino suprapatellar effusion. Electronically Signed   By: Aram Candela M.D.   On: 12/31/2023 20:28   DG Knee Complete 4 Views Right Result Date: 12/31/2023 CLINICAL DATA:  Larey Seat, landed on right knee EXAM: RIGHT KNEE - COMPLETE 4+ VIEW COMPARISON:  08/20/2021 FINDINGS: Frontal, bilateral oblique, and lateral views of the right knee are obtained. Evaluation is limited due to position, as the patient could not extend her right knee for imaging. No evidence of acute fracture, subluxation, or dislocation. Mild 3 compartmental osteoarthritis. No joint effusion. The soft tissues appear unremarkable. IMPRESSION: 1. Limited evaluation due to flexion of the right knee and patient's inability to extend the right knee for imaging. 2. No evidence of acute fracture. 3. Mild osteoarthritis. Electronically Signed   By: Sharlet Salina M.D.   On: 12/31/2023 18:13    Procedures Procedures    Medications Ordered in ED Medications  oxyCODONE-acetaminophen (PERCOCET/ROXICET) 5-325 MG per tablet 1 tablet (1 tablet Oral Given 12/31/23 1851)  diazepam (VALIUM) tablet 2 mg (2 mg Oral Given 12/31/23 1855)    ED Course/ Medical Decision Making/ A&P                                 Medical Decision Making Patient is an 89 year old female, here for fall, that occurred, while she was walking out to the driveway, she fell on the gravel, fell on her right knee, is unable to put weight on it.  It is partly flexed, but I am able to passively extend it, but she recoils in pain when I do this.  Will obtain x-ray for further evaluation.  Percocet for pain control.  Amount and/or Complexity of Data Reviewed Radiology: ordered.    Details: Initial x-ray, limited, repeat x-ray shows Jonahtan Manseau suprapatellar effusion, with severe tricompartmental arthritis Discussion of  management or test interpretation with external provider(s): Discussed with patient, and family, after pain medication she is still unable to bear weight.  She lives with her nephew, but does not have a bedside commode, and walks with a cane typically.  She does not have the resources to perform her ADLs, as usual, and is unable to ambulate, secondary to the suprapatellar effusion.  I discussed with her son at bedside, we will have her stay overnight, and be evaluated by physical therapy, and social work, for possible nursing home placement, given her inability to ambulate, versus resources for DME, to help perform ADLs.  He is in agreement with plans.  She  states that she takes only 1 medication, but cannot remember what, and the son states he will check.  Will be boarder overnight.  Physical therapy and social work to see in a.m.  Risk OTC drugs. Prescription drug management.    Final Clinical Impression(s) / ED Diagnoses Final diagnoses:  Suprapatellar effusion of knee  Fall, initial encounter    Rx / DC Orders ED Discharge Orders     None         Andera Cranmer, Harley [place], PA 12/31/23 2059    Royanne Foots, DO 12/31/23 2356

## 2023-12-31 NOTE — ED Notes (Signed)
 Attempted to walk pt but was unable to stand or bear weight on her own.

## 2024-01-01 ENCOUNTER — Emergency Department (HOSPITAL_COMMUNITY)

## 2024-01-01 NOTE — Progress Notes (Signed)
Pt faxed out. Bed offers pending.

## 2024-01-01 NOTE — ED Provider Notes (Signed)
 Called to eval patient bc physical therapy feels that patient's knee pain is greater than would be expected without a fracture.  Did examine the patient she is having difficulty ranging her knee.  Does have a small effusion as well.  Will order a CT to evaluate for occult fracture.   Rondel Baton, MD 01/01/24 [phone]

## 2024-01-01 NOTE — ED Provider Notes (Signed)
 Emergency Medicine Observation Re-evaluation Note  Georgine Wiltse is a 89 y.o. female, seen on rounds today.  Pt initially presented to the ED for complaints of Fall and Knee Pain Currently, the patient is not having any acute complaints..  Physical Exam  BP (!) 159/90   Pulse (!) 58   Temp 98.1 F (36.7 C)   Resp 18   SpO2 97%  Physical Exam General: Resting comfortably in stretcher Lungs: Normal work of breathing Psych: Calm  ED Course / MDM  EKG:   I have reviewed the labs performed to date as well as medications administered while in observation.  Recent changes in the last 24 hours include no acute changes.  Plan  Current plan is for PT evaluation and placement.    Rondel Baton, MD 01/01/24 [phone]

## 2024-01-01 NOTE — Evaluation (Signed)
 Physical Therapy Evaluation Patient Details Name: Madeline Jimenez MRN: [id number] DOB: [birth year] Today's Date: 01/01/2024  History of Present Illness  89 yo female sustained a mechanical fall  and brought to ED 12/31/23 with ianbility to ambulate.repeat x-ray shows small suprapatellar effusion, with severe tricompartmental arthritis. PMH: COPD, HTN, PVD, hyponatremia, UTI, I and D LUE  Clinical Impression  The patient presents with significant dysfunction of the RLE, Unable to bear weight, the knee/hip withdraw and does not remain in extension, even in supine and standing.  The Knee immobilizer nor ace wrap were not in place, RN and ortho tech notified.  PT did assist with transfer in order to assess RLE and assist to [facility]. Patient is unable to bear weight through the RLE, patient  indicates pain around the knee, especially when rolling.  Patient will benefit from continued inpatient follow up therapy, <3 hours/day  Patient is independent in ambulation with a cane and independent in self care/ADLs  Recommend orthopedic consult as symptoms are worse than xray results seem to indicate.      If plan is discharge home, recommend the following: Two people to help with walking and/or transfers;A lot of help with bathing/dressing/bathroom;Assistance with cooking/housework;Help with stairs or ramp for entrance   Can travel by private vehicle   No    Equipment Recommendations None recommended by PT  Recommendations for Other Services       Functional Status Assessment Patient has had a recent decline in their functional status and demonstrates the ability to make significant improvements in function in a reasonable and predictable amount of time.     Precautions / Restrictions Precautions Precautions: Fall Recall of Precautions/Restrictions: Impaired Precaution/Restrictions Comments: KI was no in place, ordered 3/17. RN and ortho tech notified. Did not have  inplace for transfer, no  specifics for when to wear, nor was an ace wrap inplace Required Braces or Orthoses: Knee Immobilizer - Right Restrictions Weight Bearing Restrictions Per Provider Order: No      Mobility  Bed Mobility Overal bed mobility: Needs Assistance Bed Mobility: Rolling, Sidelying to Sit, Sit to Supine Rolling: Max assist, Used rails Sidelying to sit: Max assist   Sit to supine: Max assist   General bed mobility comments: patient   reports increased pain to roll, required max support of the RLE to sitting and  back to dupine, performed x 2    Transfers Overall transfer level: Needs assistance Equipment used: Rolling walker (2 wheels) Transfers: Sit to/from Stand, Bed to chair/wheelchair/BSC Sit to Stand: Max assist Stand pivot transfers: Max assist, +2 safety/equipment, +2 physical assistance         General transfer comment: max support to stand at RW x 2 trials, Right leg flexes, patient is unable to place the foot onto the floor, pivot to BSC max of 1, +2 max to pivot from [facility] to stretcher.    Ambulation/Gait               General Gait Details: unable  Stairs            Wheelchair Mobility     Tilt Bed    Modified Rankin (Stroke Patients Only)       Balance Overall balance assessment: Needs assistance, History of Falls Sitting-balance support: Feet supported, Bilateral upper extremity supported Sitting balance-Leahy Scale: Poor Sitting balance - Comments: strong left lateral lean Postural control: Left lateral lean Standing balance support: Bilateral upper extremity supported, During functional activity, Reliant on assistive device for balance Standing balance-Leahy  Scale: Poor                               Pertinent Vitals/Pain Pain Assessment Pain Assessment: Faces Faces Pain Scale: Hurts worst Pain Location: indicates right knee , especially to flex, not so much to extend, more when rolls to the  side-lateral movements. Able to   activate quads, knee  and hip flex when supine and in standing, unable to place the foot to the floor qand bear any weight Pain Descriptors / Indicators: Grimacing, Discomfort, Crying, Moaning Pain Intervention(s): Monitored during session, Patient requesting pain meds-RN notified    Home Living Family/patient expects to be discharged to:: Private residence Living Arrangements: Other relatives Available Help at Discharge: Family;Available 24 hours/day Type of Home: House Home Access: Stairs to enter Entrance Stairs-Rails: None Entrance Stairs-Number of Steps: 2   Home Layout: One level Home Equipment: Agricultural consultant (2 wheels);Cane - single point      Prior Function Prior Level of Function : Needs assist       Physical Assist : Mobility (physical)     Mobility Comments: uses a cane ADLs Comments: family meal preps, Indep in ADL's     Extremity/Trunk Assessment   Upper Extremity Assessment Upper Extremity Assessment: Overall WFL for tasks assessed    Lower Extremity Assessment Lower Extremity Assessment: RLE deficits/detail RLE Deficits / Details: quads activate in supine, can lift the leg  but knee is flexed, Able to  extend the knee part range  in sitting,    Cervical / Trunk Assessment Cervical / Trunk Assessment: Kyphotic  Communication   Communication Communication: Impaired Factors Affecting Communication: Hearing impaired    Cognition Arousal: Alert Behavior During Therapy: WFL for tasks assessed/performed   PT - Cognitive impairments: No apparent impairments                         Following commands: Intact       Cueing       General Comments      Exercises     Assessment/Plan    PT Assessment Patient needs continued PT services  PT Problem List Decreased strength;Decreased range of motion;Decreased activity tolerance;Decreased balance;Decreased safety awareness;Decreased mobility;Decreased knowledge of precautions       PT  Treatment Interventions DME instruction;Therapeutic exercise;Gait training;Functional mobility training;Therapeutic activities;Patient/family education    PT Goals (Current goals can be found in the Care Plan section)  Acute Rehab PT Goals Patient Stated Goal: go home PT Goal Formulation: With patient Time For Goal Achievement: 01/15/24 Potential to Achieve Goals: Good    Frequency Min 2X/week     Co-evaluation               AM-PAC PT "6 Clicks" Mobility  Outcome Measure Help needed turning from your back to your side while in a flat bed without using bedrails?: A Lot Help needed moving from lying on your back to sitting on the side of a flat bed without using bedrails?: A Lot Help needed moving to and from a bed to a chair (including a wheelchair)?: Total Help needed standing up from a chair using your arms (e.g., wheelchair or bedside chair)?: Total Help needed to walk in hospital room?: Total Help needed climbing 3-5 steps with a railing? : Total 6 Click Score: 8    End of Session Equipment Utilized During Treatment: Gait belt Activity Tolerance: Patient limited by pain Patient left:  in bed;with call bell/phone within reach Nurse Communication: Mobility status PT Visit Diagnosis: Unsteadiness on feet (R26.81);History of falling (Z91.81);Difficulty in walking, not elsewhere classified (R26.2)    Time: 1610-9604 PT Time Calculation (min) (ACUTE ONLY): 43 min   Charges:   PT Evaluation $PT Eval Low Complexity: 1 Low $PT Eval Moderate Complexity: 1 Mod PT Treatments $Therapeutic Activity: 23-37 mins PT General Charges $$ ACUTE PT VISIT: 1 Visit         Blanchard Kelch PT Acute Rehabilitation Services Office [phone] Weekend pager-   Rada Hay 01/01/2024, 9:40 AM

## 2024-01-01 NOTE — [place] FL2 (Signed)
 [place] MEDICAID FL2 LEVEL OF CARE FORM     IDENTIFICATION  Patient Name: Madeline Jimenez Birthdate: 1935/01/14 Sex: female Admission Date (Current Location): 12/31/2023  [facility] and [place] Number:  Producer, television/film/video and Address:  [facility],  [address], [place] [address]      Provider Number: [id number]  Attending Physician Name and Address:  System, Provider Not In  Relative Name and Phone Number:  Hindy, Perrault (Son)  [phone] (Mobile)    Current Level of Care: Hospital Recommended Level of Care: Skilled Nursing Facility Prior Approval Number:    Date Approved/Denied:   PASRR Number: [id number]  Discharge Plan: SNF    Current Diagnoses: Patient Active Problem List   Diagnosis Date Noted   Acute pulmonary embolism without acute cor pulmonale (HCC) 11/30/2022   Tachycardia 10/02/2022   Pneumonia of both upper lobes due to infectious organism 09/25/2022   Protein-calorie malnutrition, severe 09/24/2022   MRSA bacteremia 09/21/2022   Right shoulder pain 09/21/2022   Abscess of forearm, left 09/21/2022   Abscess of right shoulder 09/21/2022   COVID-19 09/12/2022   Acute respiratory failure (HCC) 09/12/2022   UTI (urinary tract infection) 03/07/2022   COPD exacerbation (HCC)    Stage 3a chronic kidney disease (CKD) (HCC)    Dependence on nocturnal oxygen therapy    Anemia 08/09/2014    Orientation RESPIRATION BLADDER Height & Weight     Self, Time, Situation, Place  Normal Continent Weight:   Height:     BEHAVIORAL SYMPTOMS/MOOD NEUROLOGICAL BOWEL NUTRITION STATUS      Continent Diet (Regular)  AMBULATORY STATUS COMMUNICATION OF NEEDS Skin   Extensive Assist Verbally Normal                       Personal Care Assistance Level of Assistance  Bathing, Feeding, Dressing Bathing Assistance: Maximum assistance Feeding assistance: Limited assistance Dressing Assistance: Maximum assistance     Functional Limitations Info   Sight, Hearing, Speech Sight Info: Adequate Hearing Info: Adequate Speech Info: Adequate    SPECIAL CARE FACTORS FREQUENCY  PT (By licensed PT), OT (By licensed OT)     PT Frequency: x5/week OT Frequency: x5/week            Contractures Contractures Info: Not present    Additional Factors Info  Code Status, Allergies Code Status Info: Full Allergies Info: No Known Allergies           Current Medications (01/01/2024):  This is the current hospital active medication list Current Facility-Administered Medications  Medication Dose Route Frequency Provider Last Rate Last Admin   acetaminophen (TYLENOL) tablet 650 mg  650 mg Oral Q6H PRN Small, Brooke L, PA       oxyCODONE-acetaminophen (PERCOCET/ROXICET) 5-325 MG per tablet 1 tablet  1 tablet Oral Q6H PRN Small, Brooke L, PA   1 tablet at 01/01/24 1247   Current Outpatient Medications  Medication Sig Dispense Refill   acetaminophen (TYLENOL) 325 MG tablet Take 2 tablets (650 mg total) by mouth every 6 (six) hours as needed for moderate pain or mild pain.     albuterol (PROVENTIL) (2.5 MG/3ML) 0.083% nebulizer solution Inhale 3 mLs (2.5 mg total) into the lungs every 4 (four) hours as needed for wheezing or shortness of breath. 75 mL 12   alendronate (FOSAMAX) 70 MG tablet Take 70 mg by mouth once a week. Sunday     amLODipine (NORVASC) 5 MG tablet Take 5 mg by mouth daily.  escitalopram (LEXAPRO) 10 MG tablet Take 10 mg by mouth daily.       Discharge Medications: Please see discharge summary for a list of discharge medications.  Relevant Imaging Results:  Relevant Lab Results:   Additional Information [id number]  Princella Ion, LCSW

## 2024-01-01 NOTE — Progress Notes (Signed)
Awaiting PT.  

## 2024-01-01 NOTE — Progress Notes (Signed)
 CSW spoke with patient's son Louanne Calvillo, [phone]. CSW attempted to contact patients niece Orvil Feil, [phone] who is listed as patients POA. CSW was unable to speak with Darl Pikes. The patients son confirmed that Darl Pikes is the POA and she should have the paperwork to provide to the hospital. Isaias Cowman stated he will let Darl Pikes know to call CSW. Isaias Cowman accepted the bed at [facility].

## 2024-01-02 MED ORDER — ESCITALOPRAM OXALATE 10 MG PO TABS
10.0000 mg | ORAL_TABLET | Freq: Every day | ORAL | Status: DC
  Administered 2024-01-02 – 2024-01-03 (×2): 10 mg via ORAL
  Filled 2024-01-02 (×2): qty 1

## 2024-01-02 NOTE — ED Notes (Signed)
 Patient has been calm, cooperative, and pleasant during this shift. Patient is able to express needs using call bell. Patient is able to eat independently with little assistance in opening soft trays, containers, cups, etc. Patient is unable to ambulate due to R knee injury/pain. Patient urinated very little during shift. Provider advised to continue to push oral fluids. Patient denies any abdominal pain. No distension noted. Patient is currently on 3L of O2 via nasal canula.

## 2024-01-02 NOTE — ED Provider Notes (Signed)
 Emergency Medicine Observation Re-evaluation Note  Kynlie Jane is a 89 y.o. female, seen on rounds today.  Pt initially presented to the ED for complaints of Fall and Knee Pain Currently, the patient is sleeping.  Physical Exam  BP 123/65 (BP Location: Left Arm)   Pulse 69   Temp (!) 97.5 F (36.4 C) (Oral)   Resp 18   SpO2 93%  Physical Exam General: nad Cardiac: regular rate Lungs: no resp distress MSK: currently knee is completely flexed while pt is sleeping >90 degrees.  No erythema or swelling noted  ED Course / MDM  EKG:   I have reviewed the labs performed to date as well as medications administered while in observation.  Recent changes in the last 24 hours include CT yesterday without fx and tricompartmental arthritis with small effusion present.  Plan  Current plan is for [facility] today.    Gwyneth Sprout, MD 01/02/24 [phone]

## 2024-01-02 NOTE — Progress Notes (Addendum)
 CSW confirmed bed with [facility] who inquired about MRSA. Per chart review, pt has hx of MRSA from December 2023. Notified [facility] who confirmed they can accept pt. CSW to initiate Auth.   Addend @ 8:31AM Auth pending for [facility].

## 2024-01-03 NOTE — Progress Notes (Addendum)
 Auth approved. CSW has outreached to [place] at [facility] to inquire about admit time. Awaiting response.   Report info given to RN. Darl Pikes [facility]) notified and in agreement with discharge plan. PTAR to transport. No additional TOC needs.

## 2024-01-03 NOTE — ED Notes (Signed)
 Report given to [place] at [facility]. [phone])  Pt to go to Rm 303B.

## 2024-01-03 NOTE — ED Provider Notes (Signed)
 Emergency Medicine Observation Re-evaluation Note  Madeline Jimenez is a 89 y.o. female, seen on rounds today.  Pt initially presented to the ED for complaints of Fall and Knee Pain Currently, the patient is resting   Physical Exam  BP 138/66 (BP Location: Left Arm)   Pulse 61   Temp 98 F (36.7 C) (Oral)   Resp 16   SpO2 96%  Physical Exam General: NAD Cardiac: RR Lungs: Non labored  Psych: Calm   ED Course / MDM  EKG:   I have reviewed the labs performed to date as well as medications administered while in observation.  Recent changes in the last 24 hours include None.  Plan  Current plan is for Placement.    Coral Spikes, DO 01/03/24 [phone]

## 2024-01-21 ENCOUNTER — Other Ambulatory Visit: Payer: Self-pay

## 2024-01-21 DIAGNOSIS — I70219 Atherosclerosis of native arteries of extremities with intermittent claudication, unspecified extremity: Secondary | ICD-10-CM

## 2024-01-22 ENCOUNTER — Ambulatory Visit (HOSPITAL_COMMUNITY)

## 2024-01-22 ENCOUNTER — Encounter (HOSPITAL_COMMUNITY)

## 2024-01-23 ENCOUNTER — Encounter (HOSPITAL_COMMUNITY)

## 2024-01-23 ENCOUNTER — Ambulatory Visit (HOSPITAL_COMMUNITY)
Admission: RE | Admit: 2024-01-23 | Discharge: 2024-01-23 | Disposition: A | Discharge status: Home / Self Care | Source: Ambulatory Visit | Attending: Vascular Surgery | Admitting: Vascular Surgery

## 2024-01-23 DIAGNOSIS — I70219 Atherosclerosis of native arteries of extremities with intermittent claudication, unspecified extremity: Secondary | ICD-10-CM | POA: Insufficient documentation

## 2024-01-23 DIAGNOSIS — I70212 Atherosclerosis of native arteries of extremities with intermittent claudication, left leg: Secondary | ICD-10-CM | POA: Diagnosis not present

## 2024-01-23 LAB — VAS US ABI WITH/WO TBI
Left ABI: 0.81
Right ABI: 1.05

## 2024-01-24 NOTE — Progress Notes (Deleted)
 Patient ID: Madeline Jimenez, female   DOB: 04-Aug-1935, 89 y.o.   MRN: [id number]  Reason for Consult: No chief complaint on file.   Referred by Arnetha Courser, MD  Subjective:     HPI  Madeline Jimenez is a 89 y.o. female who presents for evaluation of vascular disease. She had a fall in her driveway about 1 month ago where she injured her right knee and was found to have an effusion but no fracture or dislocation. ***  Past Medical History:  Diagnosis Date   COPD (chronic obstructive pulmonary disease) (HCC)    HTN (hypertension)    Hyperlipidemia    Hyponatremia    Lymphocytosis    Peripheral vascular disease (HCC)    Recurrent UTI    Family History  Problem Relation Age of Onset   Hypertension Father    Heart attack Father    Heart attack Sister    Past Surgical History:  Procedure Laterality Date   ABDOMINAL HYSTERECTOMY     for fibroid   I & D EXTREMITY Right 09/21/2022   Procedure: IRRIGATION AND DEBRIDEMENT OF SHOULDER ABSCESS;  Surgeon: London Sheer, MD;  Location: [facility] OR;  Service: Orthopedics;  Laterality: Right;   INCISION AND DRAINAGE OF WOUND Left 09/21/2022   Procedure: IRRIGATION AND DEBRIDEMENT LEFT Mindi Junker;  Surgeon: London Sheer, MD;  Location: [facility] OR;  Service: Orthopedics;  Laterality: Left;   IR FLUORO GUIDE CV LINE RIGHT  10/02/2022   IR REMOVAL TUN CV CATH W/O FL  11/07/2022   IR US GUIDE VASC ACCESS RIGHT  10/02/2022   TONSILECTOMY, ADENOIDECTOMY, BILATERAL MYRINGOTOMY AND TUBES      Short Social History:  Social History   Tobacco Use   Smoking status: Former    Current packs/day: 0.00    Average packs/day: 2.0 packs/day for 50.0 years (100.0 ttl pk-yrs)    Types: Cigarettes    Start date: 66    Quit date: 2009    Years since quitting: 16.2   Smokeless tobacco: Never  Substance Use Topics   Alcohol use: No    Comment: 3 beers a day    No Known Allergies  Current Outpatient Medications  Medication Sig Dispense Refill    acetaminophen (TYLENOL) 325 MG tablet Take 2 tablets (650 mg total) by mouth every 6 (six) hours as needed for moderate pain or mild pain.     albuterol (PROVENTIL) (2.5 MG/3ML) 0.083% nebulizer solution Inhale 3 mLs (2.5 mg total) into the lungs every 4 (four) hours as needed for wheezing or shortness of breath. 75 mL 12   alendronate (FOSAMAX) 70 MG tablet Take 70 mg by mouth once a week. Sunday     amLODipine (NORVASC) 5 MG tablet Take 5 mg by mouth daily.     escitalopram (LEXAPRO) 10 MG tablet Take 10 mg by mouth daily.     No current facility-administered medications for this visit.    REVIEW OF SYSTEMS  Positive for ***  All other systems were reviewed and are negative     Objective:  Objective   There were no vitals filed for this visit. There is no height or weight on file to calculate BMI.  Physical Exam General: no acute distress Cardiac: hemodynamically stable Pulm: normal work of breathing Abdomen: non-tender, no pulsatile mass*** Neuro: alert, no focal deficit Extremities: no edema, cyanosis or wounds*** Vascular:   Right: ***  Left: ***   Data: ABI ABI Findings:  +--------+------------------+-----+--------+-------------------------------  ----+  Right  Rt  Pressure (mmHg)IndexWaveformComment                               +--------+------------------+-----+--------+-------------------------------  ----+  Brachial                              Unable to obtain due to  positioning  +--------+------------------+-----+--------+-------------------------------  ----+  PTA    155               1.05 biphasic                                      +--------+------------------+-----+--------+-------------------------------  ----+  DP                            biphasic                                      +--------+------------------+-----+--------+-------------------------------  ----+    +--------+------------------+-----+--------+-------------------------------  ----+  Left   Lt Pressure (mmHg)IndexWaveformComment                               +--------+------------------+-----+--------+-------------------------------  ----+  [id number]                                                                 +--------+------------------+-----+--------+-------------------------------  ----+  PTA                                   unable to obtain due to  positioning  +--------+------------------+-----+--------+-------------------------------  ----+  DP     119               0.81 biphasic                                      +--------+------------------+-----+--------+-------------------------------  ----+   +-------+-----------+----------------+------------+------------+  ABI/TBIToday's ABIToday's TBI     Previous ABIPrevious TBI  +-------+-----------+----------------+------------+------------+  Right 1.05       patient movement                          +-------+-----------+----------------+------------+------------+  Left  0.81       patient movement                          +-------+-----------+----------------+------------+------------+      Assessment/Plan:     Madeline Jimenez is a 89 y.o. female ***    Recommendations to optimize cardiovascular risk: Abstinence from all tobacco products. Blood glucose control with goal A1c < 7%. Blood pressure control with goal blood pressure < 140/90 mmHg. Lipid reduction therapy with goal LDL-C <100 mg/dL  Aspirin 81mg  PO QD.  Atorvastatin 40-80mg  PO QD (or other "high intensity" statin therapy).  Daria Pastures MD [facility]

## 2024-01-25 ENCOUNTER — Encounter: Admitting: Vascular Surgery

## 2024-02-11 NOTE — Progress Notes (Deleted)
 Office Note     CC:  Severe PAD Requesting Provider:  Luna Salinas, MD  HPI: Madeline Jimenez is a 89 y.o. (January 19, 1935) female presenting at the request of .Luna Salinas, MD ***  The pt is *** on a statin for cholesterol management.  The pt is *** on a daily aspirin .   Other AC:  *** The pt is *** on medication for hypertension.   The pt is *** diabetic.  Tobacco hx:  ***  Past Medical History:  Diagnosis Date   COPD (chronic obstructive pulmonary disease) (HCC)    HTN (hypertension)    Hyperlipidemia    Hyponatremia    Lymphocytosis    Peripheral vascular disease (HCC)    Recurrent UTI     Past Surgical History:  Procedure Laterality Date   ABDOMINAL HYSTERECTOMY     for fibroid   I & D EXTREMITY Right 09/21/2022   Procedure: IRRIGATION AND DEBRIDEMENT OF SHOULDER ABSCESS;  Surgeon: Diedra Fowler, MD;  Location: [facility] OR;  Service: Orthopedics;  Laterality: Right;   INCISION AND DRAINAGE OF WOUND Left 09/21/2022   Procedure: IRRIGATION AND DEBRIDEMENT LEFT Tenna Fees;  Surgeon: Diedra Fowler, MD;  Location: [facility] OR;  Service: Orthopedics;  Laterality: Left;   IR FLUORO GUIDE CV LINE RIGHT  10/02/2022   IR REMOVAL TUN CV CATH W/O FL  11/07/2022   IR US  GUIDE VASC ACCESS RIGHT  10/02/2022   TONSILECTOMY, ADENOIDECTOMY, BILATERAL MYRINGOTOMY AND TUBES      Social History   Socioeconomic History   Marital status: Single    Spouse name: Not on file   Number of children: 1   Years of education: Not on file   Highest education level: Not on file  Occupational History    Comment: retired Advertising copywriter  Tobacco Use   Smoking status: Former    Current packs/day: 0.00    Average packs/day: 2.0 packs/day for 50.0 years (100.0 ttl pk-yrs)    Types: Cigarettes    Start date: 89    Quit date: 2009    Years since quitting: 16.3   Smokeless tobacco: Never  Vaping Use   Vaping status: Never Used  Substance and Sexual Activity   Alcohol use: No    Comment: 3 beers a day    Drug use: No   Sexual activity: Not on file  Other Topics Concern   Not on file  Social History Narrative   Not on file   Social Drivers of Health   Financial Resource Strain: Not on file  Food Insecurity: No Food Insecurity (11/30/2022)   Hunger Vital Sign    Worried About Running Out of Food in the Last Year: Never true    Ran Out of Food in the Last Year: Never true  Transportation Needs: No Transportation Needs (11/30/2022)   PRAPARE - Administrator, Civil Service (Medical): No    Lack of Transportation (Non-Medical): No  Physical Activity: Not on file  Stress: Not on file  Social Connections: Not on file  Intimate Partner Violence: Not At Risk (11/30/2022)   Humiliation, Afraid, Rape, and Kick questionnaire    Fear of Current or Ex-Partner: No    Emotionally Abused: No    Physically Abused: No    Sexually Abused: No   *** Family History  Problem Relation Age of Onset   Hypertension Father    Heart attack Father    Heart attack Sister     Current Outpatient Medications  Medication Sig Dispense  Refill   acetaminophen  (TYLENOL ) 325 MG tablet Take 2 tablets (650 mg total) by mouth every 6 (six) hours as needed for moderate pain or mild pain.     albuterol  (PROVENTIL ) (2.5 MG/3ML) 0.083% nebulizer solution Inhale 3 mLs (2.5 mg total) into the lungs every 4 (four) hours as needed for wheezing or shortness of breath. 75 mL 12   alendronate (FOSAMAX) 70 MG tablet Take 70 mg by mouth once a week. Sunday     amLODipine  (NORVASC ) 5 MG tablet Take 5 mg by mouth daily.     escitalopram  (LEXAPRO ) 10 MG tablet Take 10 mg by mouth daily.     No current facility-administered medications for this visit.    No Known Allergies   REVIEW OF SYSTEMS:  *** [X]  denotes positive finding, [ ]  denotes negative finding Cardiac  Comments:  Chest pain or chest pressure:    Shortness of breath upon exertion:    Short of breath when lying flat:    Irregular heart rhythm:         Vascular    Pain in calf, thigh, or hip brought on by ambulation:    Pain in feet at night that wakes you up from your sleep:     Blood clot in your veins:    Leg swelling:         Pulmonary    Oxygen at home:    Productive cough:     Wheezing:         Neurologic    Sudden weakness in arms or legs:     Sudden numbness in arms or legs:     Sudden onset of difficulty speaking or slurred speech:    Temporary loss of vision in one eye:     Problems with dizziness:         Gastrointestinal    Blood in stool:     Vomited blood:         Genitourinary    Burning when urinating:     Blood in urine:        Psychiatric    Major depression:         Hematologic    Bleeding problems:    Problems with blood clotting too easily:        Skin    Rashes or ulcers:        Constitutional    Fever or chills:      PHYSICAL EXAMINATION:  There were no vitals filed for this visit.  General:  WDWN in NAD; vital signs documented above Gait: Not observed HENT: WNL, normocephalic Pulmonary: normal non-labored breathing , without wheezing Cardiac: {Desc; regular/irreg:14544} HR Abdomen: soft, NT, no masses Skin: {With/Without:20273} rashes Vascular Exam/Pulses:  Right Left  Radial {Exam; arterial pulse strength 0-4:30167} {Exam; arterial pulse strength 0-4:30167}  Ulnar {Exam; arterial pulse strength 0-4:30167} {Exam; arterial pulse strength 0-4:30167}  Femoral {Exam; arterial pulse strength 0-4:30167} {Exam; arterial pulse strength 0-4:30167}  Popliteal {Exam; arterial pulse strength 0-4:30167} {Exam; arterial pulse strength 0-4:30167}  DP {Exam; arterial pulse strength 0-4:30167} {Exam; arterial pulse strength 0-4:30167}  PT {Exam; arterial pulse strength 0-4:30167} {Exam; arterial pulse strength 0-4:30167}   Extremities: {With/Without:20273} ischemic changes, {With/Without:20273} Gangrene , {With/Without:20273} cellulitis; {With/Without:20273} open wounds;  Musculoskeletal: no  muscle wasting or atrophy  Neurologic: A&O X 3;  No focal weakness or paresthesias are detected Psychiatric:  The pt has {Desc; normal/abnormal:11317::"Normal"} affect.   Non-Invasive Vascular Imaging:     +-------+-----------+----------------+------------+------------+  ABI/TBIToday's ABIToday's TBI  Previous ABIPrevious TBI  +-------+-----------+----------------+------------+------------+  Right 1.05       patient movement                          +-------+-----------+----------------+------------+------------+  Left  0.81       patient movement                          +-------+-----------+----------------+------------+------------+      ASSESSMENT/PLAN: Madeline Jimenez is a 89 y.o. female presenting with abnormal ABI with concern for severe PAD.  On physical exam ***  ABIs demonstrated mild peripheral arterial disease bilaterally.  Waveforms are biphasic.   ***   Kayla Part, MD [facility] [phone]

## 2024-02-15 ENCOUNTER — Ambulatory Visit: Attending: Vascular Surgery | Admitting: Vascular Surgery

## 2024-04-10 ENCOUNTER — Encounter: Admitting: Vascular Surgery

## 2024-04-15 ENCOUNTER — Ambulatory Visit: Attending: Vascular Surgery | Admitting: Vascular Surgery

## 2024-04-15 ENCOUNTER — Encounter: Payer: Self-pay | Admitting: Vascular Surgery

## 2024-04-15 VITALS — BP 131/84 | HR 98 | Temp 98.0°F | Resp 22 | Ht 64.0 in | Wt 123.0 lb

## 2024-04-15 DIAGNOSIS — M79604 Pain in right leg: Secondary | ICD-10-CM | POA: Diagnosis not present

## 2024-04-15 NOTE — Progress Notes (Signed)
 [facility]  ASSESSMENT / PLAN: Madeline Jimenez is a 89 y.o. female with leg pain. She is demented and not able to provide much history. She points to her knee and describes pain. She is non ambulatory. Her vascular physical exam and non-invasive testing are reassuring. She is not a candidate for vascular intervention. Follow up with me as needed.  CHIEF COMPLAINT: leg pain  HISTORY OF PRESENT ILLNESS: Madeline Jimenez is a 89 y.o. female referred to clinic for evaluation of abnormal ABI and severe PAD.  The patient is a demented woman who lives in a skilled nursing facility.  No family is with her today.  She is not able to provide much history.  She has a contracted right knee.  She does not appear able to ambulate.   Past Medical History:  Diagnosis Date   COPD (chronic obstructive pulmonary disease) (HCC)    HTN (hypertension)    Hyperlipidemia    Hyponatremia    Lymphocytosis    Peripheral vascular disease (HCC)    Recurrent UTI     Past Surgical History:  Procedure Laterality Date   ABDOMINAL HYSTERECTOMY     for fibroid   I & D EXTREMITY Right 09/21/2022   Procedure: IRRIGATION AND DEBRIDEMENT OF SHOULDER ABSCESS;  Surgeon: Georgina Ozell LABOR, MD;  Location: [facility] OR;  Service: Orthopedics;  Laterality: Right;   INCISION AND DRAINAGE OF WOUND Left 09/21/2022   Procedure: IRRIGATION AND DEBRIDEMENT LEFT REGGIE;  Surgeon: Georgina Ozell LABOR, MD;  Location: [facility] OR;  Service: Orthopedics;  Laterality: Left;   IR FLUORO GUIDE CV LINE RIGHT  10/02/2022   IR REMOVAL TUN CV CATH W/O FL  11/07/2022   IR US  GUIDE VASC ACCESS RIGHT  10/02/2022   TONSILECTOMY, ADENOIDECTOMY, BILATERAL MYRINGOTOMY AND TUBES      Family History  Problem Relation Age of Onset   Hypertension Father    Heart attack Father    Heart attack Sister     Social History   Socioeconomic History   Marital status: Single    Spouse name: Not on file   Number of children: 1   Years  of education: Not on file   Highest education level: Not on file  Occupational History    Comment: retired Advertising copywriter  Tobacco Use   Smoking status: Former    Current packs/day: 0.00    Average packs/day: 2.0 packs/day for 50.0 years (100.0 ttl pk-yrs)    Types: Cigarettes    Start date: 60    Quit date: 2009    Years since quitting: 16.5   Smokeless tobacco: Never  Vaping Use   Vaping status: Never Used  Substance and Sexual Activity   Alcohol use: No    Comment: 3 beers a day   Drug use: No   Sexual activity: Not on file  Other Topics Concern   Not on file  Social History Narrative   Not on file   Social Drivers of Health   Financial Resource Strain: Not on file  Food Insecurity: No Food Insecurity (11/30/2022)   Hunger Vital Sign    Worried About Running Out of Food in the Last Year: Never true    Ran Out of Food in the Last Year: Never true  Transportation Needs: No Transportation Needs (11/30/2022)   PRAPARE - Administrator, Civil Service (Medical): No    Lack of Transportation (Non-Medical): No  Physical Activity: Not on file  Stress: Not on file  Social Connections: Not on file  Intimate Partner Violence: Not At Risk (11/30/2022)   Humiliation, Afraid, Rape, and Kick questionnaire    Fear of Current or Ex-Partner: No    Emotionally Abused: No    Physically Abused: No    Sexually Abused: No    No Known Allergies  Current Outpatient Medications  Medication Sig Dispense Refill   acetaminophen  (TYLENOL ) 325 MG tablet Take 2 tablets (650 mg total) by mouth every 6 (six) hours as needed for moderate pain or mild pain.     albuterol  (PROVENTIL ) (2.5 MG/3ML) 0.083% nebulizer solution Inhale 3 mLs (2.5 mg total) into the lungs every 4 (four) hours as needed for wheezing or shortness of breath. 75 mL 12   alendronate (FOSAMAX) 70 MG tablet Take 70 mg by mouth once a week. Sunday     amLODipine  (NORVASC ) 5 MG tablet Take 5 mg by mouth daily.      escitalopram  (LEXAPRO ) 10 MG tablet Take 10 mg by mouth daily.     No current facility-administered medications for this visit.    PHYSICAL EXAM There were no vitals filed for this visit.  Elderly woman in no acute distress.  In a wheelchair.  Gives one-word answers to questions. Irregular heart rate Unlabored breathing.  On nasal cannula Right dorsalis pedis artery Doppler flow triphasic Left posterior tibial artery Doppler flow triphasic Feet are warm Right lower extremity contracted  PERTINENT LABORATORY AND RADIOLOGIC DATA  Most recent CBC    Latest Ref Rng & Units 11/30/2022   10:33 AM 10/03/2022    3:24 AM 10/02/2022   12:35 PM  CBC  WBC 4.0 - 10.5 K/uL 6.3  21.7  24.2   Hemoglobin 12.0 - 15.0 g/dL 89.8  8.3  9.5   Hematocrit 36.0 - 46.0 % 32.2  26.0  29.1   Platelets 150 - 400 K/uL 178  304  350      Most recent CMP    Latest Ref Rng & Units 11/30/2022   10:33 AM 10/03/2022    3:24 AM 10/02/2022    3:19 AM  CMP  Glucose 70 - 99 mg/dL 896  884  889   BUN 8 - 23 mg/dL 20  26  29    Creatinine 0.44 - 1.00 mg/dL 9.17  8.85  8.89   Sodium 135 - 145 mmol/L 137  141  139   Potassium 3.5 - 5.1 mmol/L 3.8  4.1  3.6   Chloride 98 - 111 mmol/L 99  103  98   CO2 22 - 32 mmol/L 28  32  31   Calcium 8.9 - 10.3 mg/dL 8.9  8.8  8.9   Total Protein 6.5 - 8.1 g/dL 6.5  4.9  5.4   Total Bilirubin 0.3 - 1.2 mg/dL 0.5  0.7  0.8   Alkaline Phos 38 - 126 U/L 85  77  87   AST 15 - 41 U/L 17  26  36   ALT 0 - 44 U/L 13  59  80     Renal function CrCl cannot be calculated (Patient's most recent lab result is older than the maximum 21 days allowed.).  Hgb A1c MFr Bld (%)  Date Value  09/24/2022 6.1 (H)    LDL Cholesterol  Date Value Ref Range Status  08/09/2014 58 0 - 99 mg/dL Final    Comment:           Total Cholesterol/HDL:CHD Risk Coronary Heart Disease Risk Table  Men   Women  1/2 Average Risk   3.4   3.3  Average Risk       5.0   4.4  2 X  Average Risk   9.6   7.1  3 X Average Risk  23.4   11.0        Use the calculated Patient Ratio above and the CHD Risk Table to determine the patient's CHD Risk.        ATP III CLASSIFICATION (LDL):  <100     mg/dL   Optimal  899-870  mg/dL   Near or Above                    Optimal  130-159  mg/dL   Borderline  839-810  mg/dL   High  >809     mg/dL   Very High     ABI not complete due to patient movement.  Right: Resting right ankle-brachial index is within normal range.   Left: Resting left ankle-brachial index indicates mild left lower  extremity arterial disease.   Madeline SAILOR. Magda, MD FACS [facility] Phone Number: [phone] 04/15/2024 7:42 AM   Total time spent on preparing this encounter including chart review, data review, collecting history, examining the patient, and coordinating care: 45 minutes  Portions of this report may have been transcribed using voice recognition software.  Every effort has been made to ensure accuracy; however, inadvertent computerized transcription errors may still be present.
